# Patient Record
Sex: Female | Born: 1960 | Race: Black or African American | Hispanic: Yes | Marital: Married | Smoking: Current every day smoker
Health system: Southern US, Community
[De-identification: ages and names within clinical notes are randomized; demographics above are authoritative.]

## PROBLEM LIST (undated history)

## (undated) DIAGNOSIS — J40 Bronchitis, not specified as acute or chronic: Secondary | ICD-10-CM

## (undated) DIAGNOSIS — T7840XA Allergy, unspecified, initial encounter: Secondary | ICD-10-CM

## (undated) DIAGNOSIS — M47816 Spondylosis without myelopathy or radiculopathy, lumbar region: Secondary | ICD-10-CM

## (undated) DIAGNOSIS — R7303 Prediabetes: Secondary | ICD-10-CM

## (undated) DIAGNOSIS — Z87442 Personal history of urinary calculi: Secondary | ICD-10-CM

## (undated) DIAGNOSIS — M5030 Other cervical disc degeneration, unspecified cervical region: Secondary | ICD-10-CM

## (undated) DIAGNOSIS — I10 Essential (primary) hypertension: Secondary | ICD-10-CM

## (undated) DIAGNOSIS — J9690 Respiratory failure, unspecified, unspecified whether with hypoxia or hypercapnia: Secondary | ICD-10-CM

## (undated) DIAGNOSIS — I700 Atherosclerosis of aorta: Secondary | ICD-10-CM

## (undated) DIAGNOSIS — G894 Chronic pain syndrome: Secondary | ICD-10-CM

## (undated) DIAGNOSIS — M797 Fibromyalgia: Secondary | ICD-10-CM

## (undated) DIAGNOSIS — J449 Chronic obstructive pulmonary disease, unspecified: Secondary | ICD-10-CM

## (undated) DIAGNOSIS — R0600 Dyspnea, unspecified: Secondary | ICD-10-CM

## (undated) DIAGNOSIS — J45909 Unspecified asthma, uncomplicated: Secondary | ICD-10-CM

## (undated) DIAGNOSIS — E782 Mixed hyperlipidemia: Secondary | ICD-10-CM

## (undated) DIAGNOSIS — J438 Other emphysema: Secondary | ICD-10-CM

## (undated) DIAGNOSIS — R918 Other nonspecific abnormal finding of lung field: Secondary | ICD-10-CM

## (undated) DIAGNOSIS — R0902 Hypoxemia: Secondary | ICD-10-CM

## (undated) DIAGNOSIS — I2510 Atherosclerotic heart disease of native coronary artery without angina pectoris: Secondary | ICD-10-CM

## (undated) DIAGNOSIS — H21531 Iridodialysis, right eye: Secondary | ICD-10-CM

## (undated) HISTORY — PX: KNEE SURGERY: SHX244

## (undated) HISTORY — PX: OTHER SURGICAL HISTORY: SHX169

## (undated) HISTORY — PX: EYE SURGERY: SHX253

## (undated) HISTORY — PX: OVARY SURGERY: SHX727

## (undated) HISTORY — PX: CATARACT EXTRACTION: SUR2

---
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
## 2004-02-07 ENCOUNTER — Other Ambulatory Visit: Payer: Self-pay

## 2005-04-26 ENCOUNTER — Ambulatory Visit: Payer: Self-pay | Admitting: Internal Medicine

## 2005-09-12 ENCOUNTER — Emergency Department: Payer: Self-pay | Admitting: Emergency Medicine

## 2005-11-15 ENCOUNTER — Ambulatory Visit: Payer: Self-pay | Admitting: Obstetrics and Gynecology

## 2006-02-14 ENCOUNTER — Emergency Department: Payer: Self-pay | Admitting: Emergency Medicine

## 2006-05-23 ENCOUNTER — Ambulatory Visit: Payer: Self-pay | Admitting: General Surgery

## 2006-06-16 ENCOUNTER — Ambulatory Visit: Payer: Self-pay

## 2006-08-07 ENCOUNTER — Emergency Department: Payer: Self-pay | Admitting: Emergency Medicine

## 2006-10-26 ENCOUNTER — Emergency Department: Payer: Self-pay | Admitting: Internal Medicine

## 2007-02-13 ENCOUNTER — Ambulatory Visit: Payer: Self-pay

## 2007-04-30 ENCOUNTER — Emergency Department: Payer: Self-pay | Admitting: Unknown Physician Specialty

## 2007-06-19 ENCOUNTER — Emergency Department: Payer: Self-pay | Admitting: Emergency Medicine

## 2007-06-27 ENCOUNTER — Emergency Department: Payer: Self-pay | Admitting: Emergency Medicine

## 2007-08-15 ENCOUNTER — Ambulatory Visit: Payer: Self-pay

## 2007-08-17 ENCOUNTER — Other Ambulatory Visit: Payer: Self-pay

## 2007-08-17 ENCOUNTER — Emergency Department: Payer: Self-pay | Admitting: Emergency Medicine

## 2007-10-04 ENCOUNTER — Ambulatory Visit: Payer: Self-pay | Admitting: Internal Medicine

## 2007-10-17 ENCOUNTER — Emergency Department: Payer: Self-pay | Admitting: Emergency Medicine

## 2008-04-19 ENCOUNTER — Other Ambulatory Visit: Payer: Self-pay

## 2008-04-19 ENCOUNTER — Observation Stay: Payer: Self-pay | Admitting: Internal Medicine

## 2008-04-25 ENCOUNTER — Ambulatory Visit: Payer: Self-pay | Admitting: Internal Medicine

## 2008-05-14 ENCOUNTER — Ambulatory Visit: Payer: Self-pay | Admitting: Family Medicine

## 2008-08-15 ENCOUNTER — Other Ambulatory Visit: Payer: Self-pay

## 2008-08-15 ENCOUNTER — Emergency Department: Payer: Self-pay | Admitting: Emergency Medicine

## 2009-01-28 ENCOUNTER — Emergency Department: Payer: Self-pay | Admitting: Emergency Medicine

## 2009-02-15 ENCOUNTER — Ambulatory Visit: Payer: Self-pay

## 2009-03-21 ENCOUNTER — Emergency Department: Payer: Self-pay | Admitting: Emergency Medicine

## 2009-07-22 ENCOUNTER — Emergency Department: Payer: Self-pay | Admitting: Internal Medicine

## 2009-08-06 ENCOUNTER — Inpatient Hospital Stay: Payer: Self-pay | Admitting: Student

## 2009-08-13 ENCOUNTER — Ambulatory Visit: Payer: Self-pay

## 2010-03-10 ENCOUNTER — Emergency Department: Payer: Self-pay | Admitting: Internal Medicine

## 2010-07-02 ENCOUNTER — Emergency Department: Payer: Self-pay | Admitting: Emergency Medicine

## 2010-09-02 ENCOUNTER — Ambulatory Visit: Payer: Self-pay

## 2010-10-14 ENCOUNTER — Inpatient Hospital Stay: Payer: Self-pay | Admitting: Internal Medicine

## 2011-04-09 ENCOUNTER — Emergency Department: Payer: Self-pay | Admitting: Emergency Medicine

## 2011-09-22 ENCOUNTER — Ambulatory Visit: Payer: Self-pay

## 2011-10-19 ENCOUNTER — Ambulatory Visit: Payer: Self-pay | Admitting: Gynecologic Oncology

## 2011-10-21 ENCOUNTER — Ambulatory Visit: Payer: Self-pay | Admitting: Gynecologic Oncology

## 2011-10-22 ENCOUNTER — Emergency Department: Payer: Self-pay | Admitting: Emergency Medicine

## 2011-10-23 ENCOUNTER — Emergency Department: Payer: Self-pay | Admitting: Emergency Medicine

## 2011-11-27 ENCOUNTER — Emergency Department: Payer: Self-pay | Admitting: Emergency Medicine

## 2011-12-22 ENCOUNTER — Ambulatory Visit: Payer: Self-pay

## 2011-12-28 ENCOUNTER — Ambulatory Visit: Payer: Self-pay

## 2011-12-28 ENCOUNTER — Emergency Department: Payer: Self-pay | Admitting: Emergency Medicine

## 2012-08-05 ENCOUNTER — Emergency Department: Payer: Self-pay | Admitting: Unknown Physician Specialty

## 2012-08-14 ENCOUNTER — Encounter: Payer: Self-pay | Admitting: Family Medicine

## 2012-08-20 ENCOUNTER — Encounter: Payer: Self-pay | Admitting: Family Medicine

## 2012-10-22 ENCOUNTER — Ambulatory Visit: Payer: Self-pay | Admitting: Internal Medicine

## 2012-10-22 LAB — URINALYSIS, COMPLETE
Bilirubin,UR: NEGATIVE
Blood: NEGATIVE
Ketone: NEGATIVE
Nitrite: NEGATIVE
Ph: 5 (ref 4.5–8.0)
Protein: NEGATIVE

## 2012-10-23 LAB — URINE CULTURE

## 2012-11-29 ENCOUNTER — Ambulatory Visit: Payer: Self-pay | Admitting: Family

## 2013-07-05 ENCOUNTER — Ambulatory Visit: Payer: Self-pay

## 2013-07-05 LAB — URINALYSIS, COMPLETE
Bacteria: NEGATIVE
Bilirubin,UR: NEGATIVE
Glucose,UR: NEGATIVE mg/dL (ref 0–75)
Ketone: NEGATIVE
Leukocyte Esterase: NEGATIVE
Nitrite: NEGATIVE
Protein: NEGATIVE
RBC,UR: NONE SEEN /HPF (ref 0–5)

## 2013-07-22 ENCOUNTER — Ambulatory Visit: Payer: Self-pay | Admitting: Family Medicine

## 2013-08-09 ENCOUNTER — Ambulatory Visit: Payer: Self-pay

## 2013-08-10 ENCOUNTER — Ambulatory Visit: Payer: Self-pay | Admitting: Family Medicine

## 2013-08-20 ENCOUNTER — Ambulatory Visit: Payer: Self-pay | Admitting: Physician Assistant

## 2013-12-06 ENCOUNTER — Ambulatory Visit: Payer: Self-pay | Admitting: Family Medicine

## 2013-12-15 ENCOUNTER — Ambulatory Visit: Payer: Self-pay | Admitting: Family Medicine

## 2014-04-16 ENCOUNTER — Emergency Department: Payer: Self-pay | Admitting: Emergency Medicine

## 2014-09-06 DIAGNOSIS — I10 Essential (primary) hypertension: Secondary | ICD-10-CM | POA: Diagnosis present

## 2014-09-29 ENCOUNTER — Ambulatory Visit: Payer: Self-pay | Admitting: Physician Assistant

## 2014-09-29 LAB — CBC WITH DIFFERENTIAL/PLATELET
BASOS ABS: 0.1 10*3/uL (ref 0.0–0.1)
BASOS PCT: 0.6 %
Eosinophil #: 0.1 10*3/uL (ref 0.0–0.7)
Eosinophil %: 0.7 %
HCT: 44.6 % (ref 35.0–47.0)
HGB: 14.8 g/dL (ref 12.0–16.0)
LYMPHS PCT: 23 %
Lymphocyte #: 2.3 10*3/uL (ref 1.0–3.6)
MCH: 31 pg (ref 26.0–34.0)
MCHC: 33.3 g/dL (ref 32.0–36.0)
MCV: 93 fL (ref 80–100)
MONO ABS: 0.6 x10 3/mm (ref 0.2–0.9)
MONOS PCT: 5.7 %
Neutrophil #: 6.9 10*3/uL — ABNORMAL HIGH (ref 1.4–6.5)
Neutrophil %: 70 %
PLATELETS: 216 10*3/uL (ref 150–440)
RBC: 4.78 10*6/uL (ref 3.80–5.20)
RDW: 12.6 % (ref 11.5–14.5)
WBC: 9.9 10*3/uL (ref 3.6–11.0)

## 2014-09-29 LAB — COMPREHENSIVE METABOLIC PANEL
ALK PHOS: 85 U/L
AST: 24 U/L (ref 15–37)
Albumin: 3.9 g/dL (ref 3.4–5.0)
Anion Gap: 8 (ref 7–16)
BUN: 14 mg/dL (ref 7–18)
Bilirubin,Total: 0.4 mg/dL (ref 0.2–1.0)
CREATININE: 0.64 mg/dL (ref 0.60–1.30)
Calcium, Total: 9.2 mg/dL (ref 8.5–10.1)
Chloride: 102 mmol/L (ref 98–107)
Co2: 31 mmol/L (ref 21–32)
EGFR (African American): >60
GLUCOSE: 89 mg/dL (ref 65–99)
Osmolality: 281 (ref 275–301)
POTASSIUM: 3.8 mmol/L (ref 3.5–5.1)
SGPT (ALT): 36 U/L
SODIUM: 141 mmol/L (ref 136–145)
TOTAL PROTEIN: 6.6 g/dL (ref 6.4–8.2)

## 2014-09-29 LAB — URINALYSIS, COMPLETE
BLOOD: NEGATIVE
Bilirubin,UR: NEGATIVE
GLUCOSE, UR: NEGATIVE
KETONE: NEGATIVE
LEUKOCYTE ESTERASE: NEGATIVE
Nitrite: NEGATIVE
Ph: 5.5 (ref 5.0–8.0)
Protein: NEGATIVE
RBC,UR: NONE SEEN /HPF (ref 0–5)
SPECIFIC GRAVITY: 1.02 (ref 1.000–1.030)

## 2014-12-31 ENCOUNTER — Ambulatory Visit: Payer: Self-pay | Admitting: Family Medicine

## 2015-05-06 DIAGNOSIS — R7303 Prediabetes: Secondary | ICD-10-CM | POA: Diagnosis present

## 2015-05-24 ENCOUNTER — Ambulatory Visit
Admission: EM | Admit: 2015-05-24 | Discharge: 2015-05-24 | Disposition: A | Discharge status: Home / Self Care | Payer: Medicaid Other | Attending: Family Medicine | Admitting: Family Medicine

## 2015-05-24 ENCOUNTER — Encounter: Payer: Self-pay | Admitting: *Deleted

## 2015-05-24 DIAGNOSIS — F17210 Nicotine dependence, cigarettes, uncomplicated: Secondary | ICD-10-CM | POA: Insufficient documentation

## 2015-05-24 DIAGNOSIS — J209 Acute bronchitis, unspecified: Secondary | ICD-10-CM | POA: Diagnosis not present

## 2015-05-24 DIAGNOSIS — J0100 Acute maxillary sinusitis, unspecified: Secondary | ICD-10-CM | POA: Diagnosis not present

## 2015-05-24 DIAGNOSIS — H6593 Unspecified nonsuppurative otitis media, bilateral: Secondary | ICD-10-CM

## 2015-05-24 DIAGNOSIS — H1033 Unspecified acute conjunctivitis, bilateral: Secondary | ICD-10-CM | POA: Insufficient documentation

## 2015-05-24 DIAGNOSIS — J40 Bronchitis, not specified as acute or chronic: Secondary | ICD-10-CM | POA: Diagnosis not present

## 2015-05-24 DIAGNOSIS — J302 Other seasonal allergic rhinitis: Secondary | ICD-10-CM

## 2015-05-24 DIAGNOSIS — J029 Acute pharyngitis, unspecified: Secondary | ICD-10-CM | POA: Insufficient documentation

## 2015-05-24 DIAGNOSIS — Z7982 Long term (current) use of aspirin: Secondary | ICD-10-CM | POA: Diagnosis not present

## 2015-05-24 DIAGNOSIS — M549 Dorsalgia, unspecified: Secondary | ICD-10-CM | POA: Diagnosis present

## 2015-05-24 DIAGNOSIS — Z79899 Other long term (current) drug therapy: Secondary | ICD-10-CM | POA: Diagnosis not present

## 2015-05-24 DIAGNOSIS — R300 Dysuria: Secondary | ICD-10-CM

## 2015-05-24 DIAGNOSIS — I10 Essential (primary) hypertension: Secondary | ICD-10-CM | POA: Diagnosis not present

## 2015-05-24 DIAGNOSIS — R05 Cough: Secondary | ICD-10-CM | POA: Insufficient documentation

## 2015-05-24 DIAGNOSIS — H1013 Acute atopic conjunctivitis, bilateral: Secondary | ICD-10-CM

## 2015-05-24 HISTORY — DX: Essential (primary) hypertension: I10

## 2015-05-24 LAB — URINALYSIS COMPLETE WITH MICROSCOPIC (ARMC ONLY)
Glucose, UA: NEGATIVE mg/dL
Hgb urine dipstick: NEGATIVE
Ketones, ur: NEGATIVE mg/dL
Leukocytes, UA: NEGATIVE
Nitrite: NEGATIVE
PH: 5.5 (ref 5.0–8.0)
Protein, ur: 30 mg/dL — AB
Specific Gravity, Urine: 1.025 (ref 1.005–1.030)

## 2015-05-24 MED ORDER — AZITHROMYCIN 250 MG PO TABS: 250.0000 mg | ORAL_TABLET | Freq: Every day | ORAL | Status: DC

## 2015-05-24 MED ORDER — ALBUTEROL SULFATE HFA 108 (90 BASE) MCG/ACT IN AERS: 1.0000 | INHALATION_SPRAY | RESPIRATORY_TRACT | Status: AC | PRN

## 2015-05-24 MED ORDER — IPRATROPIUM-ALBUTEROL 0.5-2.5 (3) MG/3ML IN SOLN
3.0000 mL | Freq: Once | RESPIRATORY_TRACT | Status: AC
  Administered 2015-05-24: 3 mL via RESPIRATORY_TRACT

## 2015-05-24 MED ORDER — BENZONATATE 200 MG PO CAPS: 200.0000 mg | ORAL_CAPSULE | Freq: Three times a day (TID) | ORAL | Status: DC | PRN

## 2015-05-24 MED ORDER — PREDNISONE 20 MG PO TABS: 40.0000 mg | ORAL_TABLET | Freq: Every day | ORAL | Status: DC

## 2015-05-24 MED ORDER — CETIRIZINE HCL 10 MG PO TABS: 10.0000 mg | ORAL_TABLET | Freq: Every day | ORAL | Status: DC

## 2015-05-25 LAB — URINE CULTURE
Culture: 1000
Special Requests: NORMAL

## 2015-05-25 NOTE — Discharge Instructions (Signed)
Acute Bronchitis Bronchitis is inflammation of the airways that extend from the windpipe into the lungs (bronchi). The inflammation often causes mucus to develop. This leads to a cough, which is the most common symptom of bronchitis.  In acute bronchitis, the condition usually develops suddenly and goes away over time, usually in a couple weeks. Smoking, allergies, and asthma can make bronchitis worse. Repeated episodes of bronchitis may cause further lung problems.  CAUSES Acute bronchitis is most often caused by the same virus that causes a cold. The virus can spread from person to person (contagious) through coughing, sneezing, and touching contaminated objects. SIGNS AND SYMPTOMS   Cough.   Fever.   Coughing up mucus.   Body aches.   Chest congestion.   Chills.   Shortness of breath.   Sore throat.  DIAGNOSIS  Acute bronchitis is usually diagnosed through a physical exam. Your health care provider will also ask you questions about your medical history. Tests, such as chest X-rays, are sometimes done to rule out other conditions.  TREATMENT  Acute bronchitis usually goes away in a couple weeks. Oftentimes, no medical treatment is necessary. Medicines are sometimes given for relief of fever or cough. Antibiotic medicines are usually not needed but may be prescribed in certain situations. In some cases, an inhaler may be recommended to help reduce shortness of breath and control the cough. A cool mist vaporizer may also be used to help thin bronchial secretions and make it easier to clear the chest.  HOME CARE INSTRUCTIONS  Get plenty of rest.   Drink enough fluids to keep your urine clear or pale yellow (unless you have a medical condition that requires fluid restriction). Increasing fluids may help thin your respiratory secretions (sputum) and reduce chest congestion, and it will prevent dehydration.   Take medicines only as directed by your health care provider.  If  you were prescribed an antibiotic medicine, finish it all even if you start to feel better.  Avoid smoking and secondhand smoke. Exposure to cigarette smoke or irritating chemicals will make bronchitis worse. If you are a smoker, consider using nicotine gum or skin patches to help control withdrawal symptoms. Quitting smoking will help your lungs heal faster.   Reduce the chances of another bout of acute bronchitis by washing your hands frequently, avoiding people with cold symptoms, and trying not to touch your hands to your mouth, nose, or eyes.   Keep all follow-up visits as directed by your health care provider.  SEEK MEDICAL CARE IF: Your symptoms do not improve after 1 week of treatment.  SEEK IMMEDIATE MEDICAL CARE IF:  You develop an increased fever or chills.   You have chest pain.   You have severe shortness of breath.  You have bloody sputum.   You develop dehydration.  You faint or repeatedly feel like you are going to pass out.  You develop repeated vomiting.  You develop a severe headache. MAKE SURE YOU:   Understand these instructions.  Will watch your condition.  Will get help right away if you are not doing well or get worse. Document Released: 01/13/2005 Document Revised: 04/22/2014 Document Reviewed: 05/29/2013 [facility] Patient Information 2015 [place], [place]. This information is not intended to replace advice given to you by your health care provider. Make sure you discuss any questions you have with your health care provider.  Allergic Rhinitis Allergic rhinitis is when the mucous membranes in the nose respond to allergens. Allergens are particles in the air that cause  your body to have an allergic reaction. This causes you to release allergic antibodies. Through a chain of events, these eventually cause you to release histamine into the blood stream. Although meant to protect the body, it is this release of histamine that causes your discomfort, such  as frequent sneezing, congestion, and an itchy, runny nose.  CAUSES  Seasonal allergic rhinitis (hay fever) is caused by pollen allergens that may come from grasses, trees, and weeds. Year-round allergic rhinitis (perennial allergic rhinitis) is caused by allergens such as house dust mites, pet dander, and mold spores.  SYMPTOMS   Nasal stuffiness (congestion).  Itchy, runny nose with sneezing and tearing of the eyes. DIAGNOSIS  Your health care provider can help you determine the allergen or allergens that trigger your symptoms. If you and your health care provider are unable to determine the allergen, skin or blood testing may be used. TREATMENT  Allergic rhinitis does not have a cure, but it can be controlled by:  Medicines and allergy shots (immunotherapy).  Avoiding the allergen. Hay fever may often be treated with antihistamines in pill or nasal spray forms. Antihistamines block the effects of histamine. There are over-the-counter medicines that may help with nasal congestion and swelling around the eyes. Check with your health care provider before taking or giving this medicine.  If avoiding the allergen or the medicine prescribed do not work, there are many new medicines your health care provider can prescribe. Stronger medicine may be used if initial measures are ineffective. Desensitizing injections can be used if medicine and avoidance does not work. Desensitization is when a patient is given ongoing shots until the body becomes less sensitive to the allergen. Make sure you follow up with your health care provider if problems continue. HOME CARE INSTRUCTIONS It is not possible to completely avoid allergens, but you can reduce your symptoms by taking steps to limit your exposure to them. It helps to know exactly what you are allergic to so that you can avoid your specific triggers. SEEK MEDICAL CARE IF:   You have a fever.  You develop a cough that does not stop easily  (persistent).  You have shortness of breath.  You start wheezing.  Symptoms interfere with normal daily activities. Document Released: 08/31/2001 Document Revised: 12/11/2013 Document Reviewed: 08/13/2013 [facility] Patient Information 2015 [place], [place]. This information is not intended to replace advice given to you by your health care provider. Make sure you discuss any questions you have with your health care provider. Otitis Media With Effusion Otitis media with effusion is the presence of fluid in the middle ear. This is a common problem in children, which often follows ear infections. It may be present for weeks or longer after the infection. Unlike an acute ear infection, otitis media with effusion refers only to fluid behind the ear drum and not infection. Children with repeated ear and sinus infections and allergy problems are the most likely to get otitis media with effusion. CAUSES  The most frequent cause of the fluid buildup is dysfunction of the eustachian tubes. These are the tubes that drain fluid in the ears to the back of the nose (nasopharynx). SYMPTOMS  The main symptom of this condition is hearing loss. As a result, you or your child may: Listen to the TV at a loud volume. Not respond to questions. Ask "what" often when spoken to. Mistake or confuse one sound or word for another. There may be a sensation of fullness or pressure but usually not  pain. DIAGNOSIS  Your health care provider will diagnose this condition by examining you or your child's ears. Your health care provider may test the pressure in you or your child's ear with a tympanometer. A hearing test may be conducted if the problem persists. TREATMENT  Treatment depends on the duration and the effects of the effusion. Antibiotics, decongestants, nose drops, and cortisone-type drugs (tablets or nasal spray) may not be helpful. Children with persistent ear effusions may have delayed language or behavioral  problems. Children at risk for developmental delays in hearing, learning, and speech may require referral to a specialist earlier than children not at risk. You or your child's health care provider may suggest a referral to an ear, nose, and throat surgeon for treatment. The following may help restore normal hearing: Drainage of fluid. Placement of ear tubes (tympanostomy tubes). Removal of adenoids (adenoidectomy). HOME CARE INSTRUCTIONS  Avoid secondhand smoke. Infants who are breastfed are less likely to have this condition. Avoid feeding infants while they are lying flat. Avoid known environmental allergens. Avoid people who are sick. SEEK MEDICAL CARE IF:  Hearing is not better in 3 months. Hearing is worse. Ear pain. Drainage from the ear. Dizziness. MAKE SURE YOU:  Understand these instructions. Will watch your condition. Will get help right away if you are not doing well or get worse. Document Released: 01/13/2005 Document Revised: 04/22/2014 Document Reviewed: 07/03/2013 [facility] Patient Information 2015 [place], [place]. This information is not intended to replace advice given to you by your health care provider. Make sure you discuss any questions you have with your health care provider. Sinusitis Sinusitis is redness, soreness, and inflammation of the paranasal sinuses. Paranasal sinuses are air pockets within the bones of your face (beneath the eyes, the middle of the forehead, or above the eyes). In healthy paranasal sinuses, mucus is able to drain out, and air is able to circulate through them by way of your nose. However, when your paranasal sinuses are inflamed, mucus and air can become trapped. This can allow bacteria and other germs to grow and cause infection. Sinusitis can develop quickly and last only a short time (acute) or continue over a long period (chronic). Sinusitis that lasts for more than 12 weeks is considered chronic.  CAUSES  Causes of sinusitis  include:  Allergies.  Structural abnormalities, such as displacement of the cartilage that separates your nostrils (deviated septum), which can decrease the air flow through your nose and sinuses and affect sinus drainage.  Functional abnormalities, such as when the small hairs (cilia) that line your sinuses and help remove mucus do not work properly or are not present. SIGNS AND SYMPTOMS  Symptoms of acute and chronic sinusitis are the same. The primary symptoms are pain and pressure around the affected sinuses. Other symptoms include:  Upper toothache.  Earache.  Headache.  Bad breath.  Decreased sense of smell and taste.  A cough, which worsens when you are lying flat.  Fatigue.  Fever.  Thick drainage from your nose, which often is green and may contain pus (purulent).  Swelling and warmth over the affected sinuses. DIAGNOSIS  Your health care provider will perform a physical exam. During the exam, your health care provider may:  Look in your nose for signs of abnormal growths in your nostrils (nasal polyps).  Tap over the affected sinus to check for signs of infection.  View the inside of your sinuses (endoscopy) using an imaging device that has a light attached (endoscope). If your health  care provider suspects that you have chronic sinusitis, one or more of the following tests may be recommended:  Allergy tests.  Nasal culture. A sample of mucus is taken from your nose, sent to a lab, and screened for bacteria.  Nasal cytology. A sample of mucus is taken from your nose and examined by your health care provider to determine if your sinusitis is related to an allergy. TREATMENT  Most cases of acute sinusitis are related to a viral infection and will resolve on their own within 10 days. Sometimes medicines are prescribed to help relieve symptoms (pain medicine, decongestants, nasal steroid sprays, or saline sprays).  However, for sinusitis related to a bacterial  infection, your health care provider will prescribe antibiotic medicines. These are medicines that will help kill the bacteria causing the infection.  Rarely, sinusitis is caused by a fungal infection. In theses cases, your health care provider will prescribe antifungal medicine. For some cases of chronic sinusitis, surgery is needed. Generally, these are cases in which sinusitis recurs more than 3 times per year, despite other treatments. HOME CARE INSTRUCTIONS   Drink plenty of water. Water helps thin the mucus so your sinuses can drain more easily.  Use a humidifier.  Inhale steam 3 to 4 times a day (for example, sit in the bathroom with the shower running).  Apply a warm, moist washcloth to your face 3 to 4 times a day, or as directed by your health care provider.  Use saline nasal sprays to help moisten and clean your sinuses.  Take medicines only as directed by your health care provider.  If you were prescribed either an antibiotic or antifungal medicine, finish it all even if you start to feel better. SEEK IMMEDIATE MEDICAL CARE IF:  You have increasing pain or severe headaches.  You have nausea, vomiting, or drowsiness.  You have swelling around your face.  You have vision problems.  You have a stiff neck.  You have difficulty breathing. MAKE SURE YOU:   Understand these instructions.  Will watch your condition.  Will get help right away if you are not doing well or get worse. Document Released: 12/06/2005 Document Revised: 04/22/2014 Document Reviewed: 12/21/2011 [facility] Patient Information 2015 [place], [place]. This information is not intended to replace advice given to you by your health care provider. Make sure you discuss any questions you have with your health care provider. Dysuria Dysuria is the medical term for pain with urination. There are many causes for dysuria, but urinary tract infection is the most common. If a urinalysis was performed it can show  that there is a urinary tract infection. A urine culture confirms that you or your child is sick. You will need to follow up with a healthcare provider because:  If a urine culture was done you will need to know the culture results and treatment recommendations.  If the urine culture was positive, you or your child will need to be put on antibiotics or know if the antibiotics prescribed are the right antibiotics for your urinary tract infection.  If the urine culture is negative (no urinary tract infection), then other causes may need to be explored or antibiotics need to be stopped. Today laboratory work may have been done and there does not seem to be an infection. If cultures were done they will take at least 24 to 48 hours to be completed. Today x-rays may have been taken and they read as normal. No cause can be found for the problems.  The x-rays may be re-read by a radiologist and you will be contacted if additional findings are made. You or your child may have been put on medications to help with this problem until you can see your primary caregiver. If the problems get better, see your primary caregiver if the problems return. If you were given antibiotics (medications which kill germs), take all of the mediations as directed for the full course of treatment.  If laboratory work was done, you need to find the results. Leave a telephone number where you can be reached. If this is not possible, make sure you find out how you are to get test results. HOME CARE INSTRUCTIONS   Drink lots of fluids. For adults, drink eight, 8 ounce glasses of clear juice or water a day. For children, replace fluids as suggested by your caregiver.  Empty the bladder often. Avoid holding urine for long periods of time.  After a bowel movement, women should cleanse front to back, using each tissue only once.  Empty your bladder before and after sexual intercourse.  Take all the medicine given to you until it is  gone. You may feel better in a few days, but TAKE ALL MEDICINE.  Avoid caffeine, tea, alcohol and carbonated beverages, because they tend to irritate the bladder.  In men, alcohol may irritate the prostate.  Only take over-the-counter or prescription medicines for pain, discomfort, or fever as directed by your caregiver.  If your caregiver has given you a follow-up appointment, it is very important to keep that appointment. Not keeping the appointment could result in a chronic or permanent injury, pain, and disability. If there is any problem keeping the appointment, you must call back to this facility for assistance. SEEK IMMEDIATE MEDICAL CARE IF:   Back pain develops.  A fever develops.  There is nausea (feeling sick to your stomach) or vomiting (throwing up).  Problems are no better with medications or are getting worse. MAKE SURE YOU:   Understand these instructions.  Will watch your condition.  Will get help right away if you are not doing well or get worse. Document Released: 09/03/2004 Document Revised: 02/28/2012 Document Reviewed: 07/11/2008 [facility] Patient Information 2015 [place], [place]. This information is not intended to replace advice given to you by your health care provider. Make sure you discuss any questions you have with your health care provider.

## 2015-05-25 NOTE — ED Provider Notes (Signed)
CSN: [id number]     Arrival date & time 05/24/15  1403 History   First MD Initiated Contact with Patient 05/24/15 1518     Chief Complaint  Patient presents with  . Back Pain    x 5 days lower back pain with radiating pain down both legs and groin area  . Cough    x 2 days with sore throat, hoarseness, yellow phlegm when she coughs sometimes, watery eyes, itching in ears and sinus pain and pressure    (Consider location/radiation/quality/duration/timing/severity/associated sxs/prior Treatment) HPI Comments: hispanic female smoker with exposure to sick grandson urinary frequency, back pain, whole body aches, sinus pressure, headache, productive cough x 2 days was nonproductive prior to that, watery eyes, itching in ears, sore throat but doesn't feel like strep, hoarse voice, shortness of breath, wheezing all the time, nausea, postnasal drip, works as Programmer, applications, seasonal allergies had improved so stopped zyrtec but is still using nasal saline  Patient is a 54 y.o. female presenting with back pain and cough. The history is provided by the patient.  Back Pain Location:  Lumbar spine Quality:  Aching Radiates to:  Does not radiate Pain severity:  Mild Pain is:  Same all the time Onset quality:  Gradual Duration:  5 days Timing:  Intermittent Progression:  Waxing and waning Chronicity:  Recurrent Context: lifting heavy objects and recent illness   Context: not emotional stress, not falling, not jumping from heights, not MCA, not MVA, not occupational injury, not pedestrian accident, not physical stress, not recent injury and not twisting   Relieved by:  Nothing Worsened by:  Twisting, bending and coughing Ineffective treatments:  Being still Associated symptoms: dysuria   Associated symptoms: no abdominal pain, no abdominal swelling, no bladder incontinence, no bowel incontinence, no chest pain, no fever, no headaches, no leg pain, no numbness, no paresthesias, no pelvic pain, no  perianal numbness, no tingling, no weakness and no weight loss   Dysuria:    Severity:  Mild   Onset quality:  Gradual   Duration:  5 days   Timing:  Constant   Progression:  Unchanged   Chronicity:  New Risk factors: no hx of cancer, no hx of osteoporosis, no lack of exercise, no menopause, not obese, not pregnant, no recent surgery, no steroid use and no vascular disease   Cough Associated symptoms: myalgias, rhinorrhea, shortness of breath, sore throat and wheezing   Associated symptoms: no chest pain, no chills, no diaphoresis, no ear pain, no eye discharge, no fever, no headaches, no rash and no weight loss     Past Medical History  Diagnosis Date  . Hypertension    Past Surgical History  Procedure Laterality Date  . Knee surgery Left   . Ovary surgery Left     partial ovary and fallopian tube removed   No family history on file. History  Substance Use Topics  . Smoking status: Current Every Day Smoker -- 1.00 packs/day    Types: Cigarettes  . Smokeless tobacco: Not on file  . Alcohol Use: Yes     Comment: occasionally   OB History    No data available     Review of Systems  Constitutional: Positive for fatigue. Negative for fever, chills, weight loss, diaphoresis, activity change and appetite change.  HENT: Positive for congestion, postnasal drip, rhinorrhea, sinus pressure, sneezing, sore throat and voice change. Negative for dental problem, drooling, ear discharge, ear pain, facial swelling, hearing loss, mouth sores, nosebleeds, tinnitus and trouble  swallowing.   Eyes: Positive for redness and itching. Negative for photophobia, pain and discharge.  Respiratory: Positive for cough, chest tightness, shortness of breath and wheezing. Negative for choking and stridor.   Cardiovascular: Negative for chest pain, palpitations and leg swelling.  Gastrointestinal: Positive for nausea. Negative for vomiting, abdominal pain, diarrhea, constipation, blood in stool, abdominal  distention, anal bleeding, rectal pain and bowel incontinence.  Endocrine: Negative for cold intolerance and heat intolerance.  Genitourinary: Positive for dysuria and frequency. Negative for bladder incontinence, urgency, hematuria, flank pain, decreased urine volume, difficulty urinating, menstrual problem, pelvic pain and dyspareunia.  Musculoskeletal: Positive for myalgias and back pain. Negative for joint swelling, arthralgias, gait problem, neck pain and neck stiffness.  Skin: Negative for color change, pallor, rash and wound.  Allergic/Immunologic: Positive for environmental allergies. Negative for food allergies.  Neurological: Negative for dizziness, tingling, tremors, seizures, syncope, facial asymmetry, speech difficulty, weakness, light-headedness, numbness, headaches and paresthesias.  Hematological: Negative for adenopathy. Does not bruise/bleed easily.  Psychiatric/Behavioral: Positive for sleep disturbance. Negative for behavioral problems, confusion, decreased concentration and agitation.    Allergies  Review of patient's allergies indicates no known allergies.  Home Medications   Prior to Admission medications   Medication Sig Start Date End Date Taking? Authorizing Provider  aspirin 81 MG tablet Take 81 mg by mouth daily.   Yes Historical Provider, MD  hydrochlorothiazide (HYDRODIURIL) 25 MG tablet Take 25 mg by mouth daily.   Yes Historical Provider, MD  potassium chloride SA (K-DUR,KLOR-CON) 20 MEQ tablet Take 20 mEq by mouth 2 (two) times daily.   Yes Historical Provider, MD  albuterol (PROVENTIL HFA;VENTOLIN HFA) 108 (90 BASE) MCG/ACT inhaler Inhale 1-2 puffs into the lungs every 4 (four) hours as needed for wheezing or shortness of breath. 05/24/15   Olen Cordial, NP  azithromycin (ZITHROMAX) 250 MG tablet Take 1 tablet (250 mg total) by mouth daily. Take first 2 tablets together, then 1 every day until finished. 05/24/15   Olen Cordial, NP  benzonatate (TESSALON)  200 MG capsule Take 1 capsule (200 mg total) by mouth 3 (three) times daily as needed for cough. 05/24/15   Olen Cordial, NP  cetirizine (ZYRTEC) 10 MG tablet Take 1 tablet (10 mg total) by mouth daily. 05/24/15   Olen Cordial, NP  predniSONE (DELTASONE) 20 MG tablet Take 2 tablets (40 mg total) by mouth daily with breakfast. 05/24/15   Olen Cordial, NP   BP 114/69 mmHg  Pulse 87  Temp(Src) 98.4 F (36.9 C) (Tympanic)  Resp 18  Ht 5' 3.5" (1.613 m)  Wt 207 lb 7 oz (94.093 kg)  BMI 36.17 kg/m2  SpO2 95%  LMP 02/08/2015 Physical Exam  Constitutional: She is oriented to person, place, and time. Vital signs are normal. She appears well-developed and well-nourished. She is active and cooperative.  Non-toxic appearance. She does not have a sickly appearance. She appears ill. No distress. She is not intubated.  Patient smell like smoke  HENT:  Head: Normocephalic and atraumatic.  Right Ear: Hearing, external ear and ear canal normal. A middle ear effusion is present.  Left Ear: Hearing, external ear and ear canal normal. A middle ear effusion is present.  Nose: Mucosal edema and rhinorrhea present. No nose lacerations, sinus tenderness, nasal deformity, septal deviation or nasal septal hematoma. No epistaxis.  No foreign bodies. Right sinus exhibits maxillary sinus tenderness. Right sinus exhibits no frontal sinus tenderness. Left sinus exhibits maxillary sinus tenderness. Left sinus exhibits  no frontal sinus tenderness.  Mouth/Throat: Uvula is midline and mucous membranes are normal. She does not have dentures. No oral lesions. No trismus in the jaw. Normal dentition. No dental abscesses, uvula swelling, lacerations or dental caries. Posterior oropharyngeal edema and posterior oropharyngeal erythema present. No oropharyngeal exudate or tonsillar abscesses.  Cobblestoning posterior pharynx; tonsils 1-2+ erythema/edema; bilateral nasal turbinates with edema/erythema clear to yellow discharge  bilateral TMs with air fluid level slight opacity 6 oclock;   Eyes: EOM and lids are normal. Pupils are equal, round, and reactive to light. Right eye exhibits no chemosis, no discharge, no exudate and no hordeolum. No foreign body present in the right eye. Left eye exhibits no chemosis, no discharge, no exudate and no hordeolum. No foreign body present in the left eye. Right conjunctiva is injected. Right conjunctiva has no hemorrhage. Left conjunctiva is injected. Left conjunctiva has no hemorrhage. No scleral icterus. Right eye exhibits normal extraocular motion and no nystagmus. Left eye exhibits normal extraocular motion and no nystagmus. Right pupil is round and reactive. Left pupil is round and reactive. Pupils are equal.  Bilateral bulbar and lid conjunctiva injected 1-2+/4  Neck: Trachea normal and normal range of motion. Neck supple. No JVD present. No thyromegaly present.  Cardiovascular: Normal rate, regular rhythm, normal heart sounds and intact distal pulses.  Exam reveals no gallop and no friction rub.   No murmur heard. Pulmonary/Chest: Effort normal. No accessory muscle usage. No apnea, no tachypnea and no bradypnea. She is not intubated. No respiratory distress. She has decreased breath sounds in the right lower field and the left lower field. She has wheezes in the right upper field and the left upper field. She has no rhonchi. She has no rales. She exhibits no tenderness.  Repeat evaluation after duoneb wheezing resolved upper lobes bilaterally and increased airflow bilateral lower lobes sp02 room air 92% decreased cough with talking  Abdominal: Soft. Bowel sounds are normal. She exhibits no distension. There is no hepatosplenomegaly. There is no tenderness. There is no rebound, no guarding and no CVA tenderness.  Dull to percussion x 4 quads  Musculoskeletal: Normal range of motion. She exhibits no edema or tenderness.  Lymphadenopathy:    She has no cervical adenopathy.   Neurological: She is alert and oriented to person, place, and time. She displays normal reflexes. Coordination normal.  Skin: Skin is warm, dry and intact. No rash noted. She is not diaphoretic. No erythema. No pallor.  Psychiatric: She has a normal mood and affect. Her speech is normal and behavior is normal. Judgment and thought content normal. Cognition and memory are normal.  Nursing note and vitals reviewed.   ED Course  Procedures (including critical care time) Labs Review Labs Reviewed  URINALYSIS COMPLETEWITH MICROSCOPIC ([facility] ONLY) - Abnormal; Notable for the following:    Bilirubin Urine TRACE (*)    Protein, ur 30 (*)    Bacteria, UA FEW (*)    Squamous Epithelial / LPF 0-5 (*)    All other components within normal limits  URINE CULTURE    Imaging Review No results found.   MDM   1. Bronchitis, acute, with bronchospasm   2. Seasonal allergies   3. Acute maxillary sinusitis, recurrence not specified   4. Otitis media with effusion, bilateral   5. Dysuria   6. Allergic conjunctivitis, bilateral    Work note for 26 May 2015. Albuterol inhaler 1-2 puffs po q4h ATC for the next week.  Prednisone 40mg  po daily x 5  days.  Discussed with patient it may raise blood pressure, increase or decrease appetite, may cause insomnia.   Bronchitis simple, community acquired, may have started as viral (probably respiratory syncytial, parainfluenza, influenza, or adenovirus), but now evidence of acute purulent bronchitis with resultant bronchial edema and mucus formation.  Differential Diagnoses:  Reactive Airway Disease (asthma, allergic aspergillosis eosinophilia), chronic bronchitis, respiratory infection (sinusitis, common cold, pneumonia), congestive heart failure, smoke/irritant exposure, reflux esophagitis, bronchogenic tumor, and/or aspiration syndromes.  I will give Zithromax for five days for possible Mycoplasma.  Without high fever, severe dyspnea and lack of physical findings or  risk factors will hold on chest radiograph and CBC at this time.  I discussed that approximately 50% of patients with acute bronchitis have a cough that lasts up to three weeks, and 25% for over a month. Tylenol, one to two tablets every four hours as needed for fever or myalgias.   No aspirin. Patient instructed to follow up in one week or sooner if symptoms worsen. Patient verbalized agreement and understanding of treatment plan.  P2:  hand washing and cover cough Patient may use normal saline nasal spray as needed. Restart zyrtec 10mg  po daily.  Consider flonase 1 spray each nostril BID and afrin 1 spray each nostril BID for 3 days maximum.   Avoid triggers if possible.  Shower prior to bedtime if exposed to triggers.  If allergic dust/dust mites recommend mattress/pillow covers/encasements; washing linens, vacuuming, sweeping, dusting weekly.  Call or return to clinic as needed if these symptoms worsen or fail to improve as anticipated.   Exitcare handout on allergic rhinitis given to patient.  Patient verbalized understanding of instructions, agreed with plan of care and had no further questions at this time.  P2:  Avoidance and hand washing. Patient has flonase at home restart 1 spray each nostril BID.  Push nasal saline 2 sprays each nostril q2h while awake.   I do not see where any further testing or imaging is necessary at this time.   I will suggest supportive care, rest, good hygiene and encourage the patient to take adequate fluids.  The patient is to return to clinic or EMERGENCY ROOM if symptoms worsen or change significantly.  Exitcare handout on sinusitis given to patient.  Patient verbalized agreement and understanding of treatment plan and had no further questions at this time.   P2:  Hand washing and cover cough Supportive treatment.   No evidence of invasive bacterial infection, non toxic and well hydrated.  Keep allergies under control/decrease throat irritation will help resolution.  This  is most likely self limiting viral infection.  I do not see where any further testing or imaging is necessary at this time.   I will suggest supportive care, rest, good hygiene and encourage the patient to take adequate fluids.  The patient is to return to clinic or EMERGENCY ROOM if symptoms worsen or change significantly e.g. ear pain, fever, purulent discharge from ears or bleeding.  Exitcare handout on otitis media with effusion given to patient.  Patient verbalized agreement and understanding of treatment plan.   LE negative.  Urine culture pending will call patient once available.  Contact me if worsening symptoms e.g. Fever, worsening back pain, urgency, worsening frequency and will start antibiotic.  Hydrate, hydrate, hydrate.  Call or return to clinic as needed if these symptoms worsen or fail to improve as anticipated.  Patient given copy of lab results and discussed results with patient.   Patient verbalized agreement and  understanding of treatment plan and had no further questions at this time. P2:  Hydrate and cranberry juice Cleared for work.  Start antihistamine medication may use zyrtec 10mg  po or ketotifen one gtt each eye BID or OTC eye drops.  Shower after allergen exposure prior to bed.  Hygiene discussed. Patient to apply warm packs prn as directed.  Instructed patient to not rub eyes.  May use over the counter eye drops/tears for pain/symptom relief.  Return to clinic if headache, fever greater than 100.67F, nausea/vomiting, purulent discharge/matting unable to open eye without using fingers, foreign body sensation, ciliary flush, worsening photophobia or vision.  Call or return to clinic as needed if these symptoms worsen or fail to improve as anticipated.  Patient verbalized agreement and understanding of treatment plan.   P2:  Hand washing   Olen Cordial, NP 05/25/15 [place], NP 05/25/15 [phone]

## 2015-06-03 ENCOUNTER — Ambulatory Visit
Admission: EM | Admit: 2015-06-03 | Discharge: 2015-06-03 | Discharge status: Absent without leave | Payer: Medicaid Other

## 2015-07-09 ENCOUNTER — Ambulatory Visit: Payer: Medicaid Other | Admitting: Dietician

## 2015-08-29 ENCOUNTER — Encounter: Payer: Medicaid Other | Attending: Family Medicine | Admitting: Dietician

## 2015-08-29 VITALS — Ht 62.5 in | Wt 212.8 lb

## 2015-08-29 DIAGNOSIS — R7309 Other abnormal glucose: Secondary | ICD-10-CM | POA: Insufficient documentation

## 2015-08-29 DIAGNOSIS — R7303 Prediabetes: Secondary | ICD-10-CM

## 2015-08-29 NOTE — Patient Instructions (Signed)
   Keep working to decrease sugar from drinks. Try some diet sodas (1-2 glasses a day or less), or Healthy Balance juice.  Control portions of starchy foods, keep to 1/2-2/3 cup, along with a lean protein and a vegetable.  If you eat cereal for a meal, include some protein such as 1/4 cup nuts or 1-2 eggs.   Eat generous portions of low-carb veggies with meals or as snacks.  Plan to eat something every 4 hours, even when working 3rd shift.

## 2015-08-29 NOTE — Progress Notes (Signed)
Medical Nutrition Therapy: Visit start time: 1300  end time: 1400  Assessment:  Diagnosis: pre-diabetes Past medical history: HTN, hyperlipidemia, fibromyalgia Psychosocial issues/ stress concerns: sometimes feeling overwhelmed, 24year-old grandson lives with patiente Preferred learning method:  . Auditory . Visual . Hands-on . No preference indicated  Current weight: 212.8lbs with shoes, just after eating Height: 5'2.5" Medications, supplements: reviewed list in chart with patient Progress and evaluation: Patient reports some steroid injections in her back in the past, will be starting on oral steroid meds.           She states that she has begun to make some diet changes to decrease sugar intake (soda).   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 Dining out frequency: occasional meals per week.  Breakfast: 1egg, 2slices bacon when taking meds Snack: none Lunch: sandwich with whole wheat bread, ham or salami, no cheese or PBJ. Or small portion leftovers. Or soup Snack: none, rarely ice cream Supper: fruit or salad, maybe skips if eating a late lunch Snack: usually none Beverages: mostly water, pepsi 20oz  Nutrition Care Education: Topics covered: diabetes prevention Basic nutrition: basic food groups, appropriate nutrient balance, appropriate meal and snack schedule, general nutrition guidelines    Weight control: benefits of weight control, calorie needs for weight loss, provided 1300kcal meal plan and instructed patient on basic meal planning. Diabetes:  goals for BGs, appropriate carb intake and balance, limiting sugar and fat intake, increasing vegetable and fiber intake; examples of balanced meals.  Nutritional Diagnosis:  NI-5.8.2 Excessive carbohydrate intake As related to large portions of starchy foods, sugar-sweetened beverages.  As evidenced by  patient report.  Intervention: Instruction as noted above.   Illustrated food portions using food models.   Discussed options for sugar-free beverages.    Patient declined scheduling follow-up at this time, but will call later if needed.  Education Materials given:  . General diet guidelines for Diabetes . Food lists/ Planning A Balanced Meal . Sample meal pattern/ menus: Quick and Healthy Meal Ideas . Goals/ instructions  Learner/ who was taught:  . Patient   Level of understanding: Marland Kitchen Verbalizes/ demonstrates competency  Demonstrated degree of understanding via:   Teach back Learning barriers: . None  Willingness to learn/ readiness for change: . Acceptance, ready for change  Monitoring and Evaluation:  Dietary intake, exercise, BG cotnrol, and body weight      follow up: prn

## 2016-06-24 ENCOUNTER — Encounter: Payer: Self-pay | Admitting: *Deleted

## 2016-06-24 ENCOUNTER — Ambulatory Visit: Payer: Medicaid Other

## 2016-06-24 ENCOUNTER — Ambulatory Visit
Admission: EM | Admit: 2016-06-24 | Discharge: 2016-06-24 | Disposition: A | Discharge status: Home / Self Care | Payer: Medicaid Other | Attending: Family Medicine | Admitting: Family Medicine

## 2016-06-24 DIAGNOSIS — M542 Cervicalgia: Secondary | ICD-10-CM | POA: Diagnosis present

## 2016-06-24 DIAGNOSIS — M25539 Pain in unspecified wrist: Secondary | ICD-10-CM

## 2016-06-24 DIAGNOSIS — I10 Essential (primary) hypertension: Secondary | ICD-10-CM | POA: Diagnosis not present

## 2016-06-24 DIAGNOSIS — F17210 Nicotine dependence, cigarettes, uncomplicated: Secondary | ICD-10-CM | POA: Diagnosis not present

## 2016-06-24 DIAGNOSIS — Z7982 Long term (current) use of aspirin: Secondary | ICD-10-CM | POA: Insufficient documentation

## 2016-06-24 DIAGNOSIS — Z79899 Other long term (current) drug therapy: Secondary | ICD-10-CM | POA: Insufficient documentation

## 2016-06-24 DIAGNOSIS — S134XXA Sprain of ligaments of cervical spine, initial encounter: Secondary | ICD-10-CM | POA: Insufficient documentation

## 2016-06-24 DIAGNOSIS — M25531 Pain in right wrist: Secondary | ICD-10-CM | POA: Diagnosis not present

## 2016-06-24 DIAGNOSIS — M25532 Pain in left wrist: Secondary | ICD-10-CM | POA: Diagnosis not present

## 2016-06-24 HISTORY — DX: Bronchitis, not specified as acute or chronic: J40

## 2016-06-24 MED ORDER — MELOXICAM 15 MG PO TABS: 15.0000 mg | ORAL_TABLET | Freq: Every day | ORAL | Status: DC

## 2016-06-24 MED ORDER — TRAMADOL HCL 50 MG PO TABS: 50.0000 mg | ORAL_TABLET | Freq: Four times a day (QID) | ORAL | Status: DC | PRN

## 2016-06-24 MED ORDER — KETOROLAC TROMETHAMINE 60 MG/2ML IM SOLN
60.0000 mg | Freq: Once | INTRAMUSCULAR | Status: AC
  Administered 2016-06-24: 60 mg via INTRAMUSCULAR

## 2016-06-24 MED ORDER — ORPHENADRINE CITRATE ER 100 MG PO TB12: 100.0000 mg | ORAL_TABLET | Freq: Two times a day (BID) | ORAL | Status: DC
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
## 2016-08-15 ENCOUNTER — Emergency Department
Admission: EM | Admit: 2016-08-15 | Discharge: 2016-08-16 | Disposition: A | Discharge status: Home / Self Care | Payer: Medicaid Other | Attending: Emergency Medicine | Admitting: Emergency Medicine

## 2016-08-15 ENCOUNTER — Encounter: Payer: Self-pay | Admitting: Emergency Medicine

## 2016-08-15 ENCOUNTER — Emergency Department: Payer: Medicaid Other

## 2016-08-15 DIAGNOSIS — Z791 Long term (current) use of non-steroidal anti-inflammatories (NSAID): Secondary | ICD-10-CM | POA: Diagnosis not present

## 2016-08-15 DIAGNOSIS — F17210 Nicotine dependence, cigarettes, uncomplicated: Secondary | ICD-10-CM | POA: Diagnosis not present

## 2016-08-15 DIAGNOSIS — R42 Dizziness and giddiness: Secondary | ICD-10-CM

## 2016-08-15 DIAGNOSIS — K029 Dental caries, unspecified: Secondary | ICD-10-CM | POA: Insufficient documentation

## 2016-08-15 DIAGNOSIS — I10 Essential (primary) hypertension: Secondary | ICD-10-CM | POA: Insufficient documentation

## 2016-08-15 DIAGNOSIS — R6884 Jaw pain: Secondary | ICD-10-CM

## 2016-08-15 DIAGNOSIS — Z7982 Long term (current) use of aspirin: Secondary | ICD-10-CM | POA: Insufficient documentation

## 2016-08-15 DIAGNOSIS — Z79899 Other long term (current) drug therapy: Secondary | ICD-10-CM | POA: Insufficient documentation

## 2016-08-15 LAB — BASIC METABOLIC PANEL
ANION GAP: 8 (ref 5–15)
BUN: 21 mg/dL — ABNORMAL HIGH (ref 6–20)
CALCIUM: 9.8 mg/dL (ref 8.9–10.3)
CO2: 31 mmol/L (ref 22–32)
Chloride: 102 mmol/L (ref 101–111)
Creatinine, Ser: 0.55 mg/dL (ref 0.44–1.00)
Glucose, Bld: 117 mg/dL — ABNORMAL HIGH (ref 65–99)
POTASSIUM: 3.4 mmol/L — AB (ref 3.5–5.1)
Sodium: 141 mmol/L (ref 135–145)

## 2016-08-15 LAB — CBC
HEMATOCRIT: 45.8 % (ref 35.0–47.0)
HEMOGLOBIN: 15.5 g/dL (ref 12.0–16.0)
MCH: 31.5 pg (ref 26.0–34.0)
MCHC: 33.9 g/dL (ref 32.0–36.0)
MCV: 92.8 fL (ref 80.0–100.0)
Platelets: 194 10*3/uL (ref 150–440)
RBC: 4.94 MIL/uL (ref 3.80–5.20)
RDW: 12.6 % (ref 11.5–14.5)
WBC: 11.5 10*3/uL — ABNORMAL HIGH (ref 3.6–11.0)

## 2016-08-15 LAB — TROPONIN I

## 2016-08-15 MED ORDER — ONDANSETRON 4 MG PO TBDP
4.0000 mg | ORAL_TABLET | Freq: Once | ORAL | Status: AC
  Administered 2016-08-15: 4 mg via ORAL

## 2016-08-15 MED ORDER — ONDANSETRON 4 MG PO TBDP
ORAL_TABLET | ORAL | Status: AC
  Filled 2016-08-15: qty 1

## 2016-08-15 NOTE — ED Triage Notes (Signed)
Pt states two days ago she began to have right sided jaw pain that progressed to right sided headache. Pt states she then began to feel dizzy, began to have sweats and has felt like she "will pass out" intermittently. Pt denies pain currently, but continues to complain fo dizziness, and sensation of intermittent near syncope. Skin normal color warm and dry, perrl 72mm and brisk. Cms intact to extremities.

## 2016-08-16 MED ORDER — ONDANSETRON 4 MG PO TBDP: ORAL_TABLET | ORAL | 0 refills | Status: DC

## 2016-08-16 MED ORDER — PENICILLIN V POTASSIUM 500 MG PO TABS: 500.0000 mg | ORAL_TABLET | Freq: Four times a day (QID) | ORAL | 0 refills | Status: DC

## 2016-08-16 MED ORDER — OXYCODONE-ACETAMINOPHEN 5-325 MG PO TABS: 1.0000 | ORAL_TABLET | ORAL | 0 refills | Status: DC | PRN

## 2016-08-16 MED ORDER — ONDANSETRON 4 MG PO TBDP
4.0000 mg | ORAL_TABLET | Freq: Once | ORAL | Status: AC
  Administered 2016-08-16: 4 mg via ORAL
  Filled 2016-08-16: qty 1

## 2016-08-16 MED ORDER — OXYCODONE-ACETAMINOPHEN 5-325 MG PO TABS
2.0000 | ORAL_TABLET | Freq: Once | ORAL | Status: AC
  Administered 2016-08-16: 2 via ORAL
  Filled 2016-08-16: qty 2

## 2016-08-16 NOTE — ED Notes (Signed)
Patient angry because she has been "here too damn long and this is ridiculous." States "I've been here in the lobby sitting and then they bring you back in this room and you sit and you wait and I'm gonna be seen right now or I'm leaving." MD and primary RN notified.

## 2016-08-16 NOTE — Discharge Instructions (Signed)
As we discussed, your lab work, CT scan, and EKG were all reassuring.  Given the tenderness had a specific point on your lower jaw right near where you have some dental problems, we suspect that the pain your experiencing in her face is from a dental source.  Please take the full course of antibiotics prescribed and follow up with your dentist at the next available opportunity.  Return to the emergency department with new or worsening symptoms that concern you.
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
## 2016-08-16 NOTE — ED Provider Notes (Signed)
[facility] Emergency Department Provider Note  ____________________________________________   First MD Initiated Contact with Patient 08/16/16 0022     (approximate)  I have reviewed the triage vital signs and the nursing notes.   HISTORY  Chief Complaint Dizziness    HPI Gloria Jones is a 55 y.o. female reports history of hypertension who presents for evaluation of 3 days of gradual onset pain in her right lower jaw that is radiating up into her face.  It is worse when she chews and swallows, better with rest.  It is limited her ability to take by mouth intake.  As a result she felt somewhat lightheaded and dizzy today and a little bit nauseated.  She denies fever/chills, chest pain, shortness of breath, vomiting, abdominal pain, dysuria.  She reports the pain is severe.  She saw her primary care doctor about 4 days ago but the pain only started 3 days ago.  She has not seen her dentist recently and does state that the pain is located in her drawer) area where she has had chronic problems with her teeth.She denies any history of cardiac issues or lung issues.   Past Medical History:  Diagnosis Date  . Bronchitis   . Hypertension     There are no active problems to display for this patient.   Past Surgical History:  Procedure Laterality Date  . KNEE SURGERY Left   . OVARY SURGERY Left    partial ovary and fallopian tube removed    Prior to Admission medications   Medication Sig Start Date End Date Taking? Authorizing Provider  albuterol (PROVENTIL HFA;VENTOLIN HFA) 108 (90 BASE) MCG/ACT inhaler Inhale 1-2 puffs into the lungs every 4 (four) hours as needed for wheezing or shortness of breath. 05/24/15   Olen Cordial, NP  aspirin 81 MG tablet Take 81 mg by mouth daily.    Historical Provider, MD  azithromycin (ZITHROMAX) 250 MG tablet Take 1 tablet (250 mg total) by mouth daily. Take first 2 tablets together, then 1 every day until finished.  05/24/15   Olen Cordial, NP  beclomethasone (QVAR) 80 MCG/ACT inhaler Inhale into the lungs. 08/28/15 08/27/16  Historical Provider, MD  benzonatate (TESSALON) 200 MG capsule Take 1 capsule (200 mg total) by mouth 3 (three) times daily as needed for cough. 05/24/15   Olen Cordial, NP  cetirizine (ZYRTEC) 10 MG tablet Take 1 tablet (10 mg total) by mouth daily. 05/24/15   Olen Cordial, NP  fluticasone (FLONASE) 50 MCG/ACT nasal spray Place into the nose. 11/06/14 06/24/16  Historical Provider, MD  hydrochlorothiazide (HYDRODIURIL) 25 MG tablet Take 25 mg by mouth daily.    Historical Provider, MD  meloxicam (MOBIC) 15 MG tablet Take 1 tablet (15 mg total) by mouth daily. 06/24/16   Frederich Cha, MD  montelukast (SINGULAIR) 10 MG tablet Take by mouth. 08/14/15 08/13/16  Historical Provider, MD  nicotine (RA NICOTINE) 21 mg/24hr patch Place onto the skin. 05/22/15   Historical Provider, MD  ondansetron (ZOFRAN ODT) 4 MG disintegrating tablet Allow 1-2 tablets to dissolve in your mouth every 8 hours as needed for nausea/vomiting 08/16/16   Hinda Kehr, MD  orphenadrine (NORFLEX) 100 MG tablet Take 1 tablet (100 mg total) by mouth 2 (two) times daily. 06/24/16   Frederich Cha, MD  oxyCODONE-acetaminophen (ROXICET) 5-325 MG tablet Take 1-2 tablets by mouth every 4 (four) hours as needed for severe pain. 08/16/16   Hinda Kehr, MD  penicillin v potassium (VEETID)  500 MG tablet Take 1 tablet (500 mg total) by mouth 4 (four) times daily. 08/16/16   Hinda Kehr, MD  potassium chloride SA (K-DUR,KLOR-CON) 20 MEQ tablet Take 20 mEq by mouth 2 (two) times daily.    Historical Provider, MD  predniSONE (DELTASONE) 20 MG tablet Take 2 tablets (40 mg total) by mouth daily with breakfast. 05/24/15   Olen Cordial, NP  pregabalin (LYRICA) 50 MG capsule Take by mouth. 05/22/15 05/21/16  Historical Provider, MD  traMADol (ULTRAM) 50 MG tablet Take 1 tablet (50 mg total) by mouth every 6 (six) hours as needed. 06/24/16   Frederich Cha,  MD    Allergies Lisinopril and Losartan  Family History  Problem Relation Age of Onset  . Hypertension Mother     Social History Social History  Substance Use Topics  . Smoking status: Current Every Day Smoker    Packs/day: 1.00    Types: Cigarettes  . Smokeless tobacco: Never Used  . Alcohol use Yes     Comment: occasionally    Review of Systems Constitutional: No fever/chills Eyes: No visual changes. ENT: No sore throat. Pain in right lower jaw radiating into face.  Chronic caries.  Cardiovascular: Denies chest pain. Respiratory: Denies shortness of breath. Gastrointestinal: No abdominal pain.  No nausea, no vomiting.  No diarrhea.  No constipation. Genitourinary: Negative for dysuria. Musculoskeletal: Negative for back pain. Skin: Negative for rash. Neurological: Negative for headaches, focal weakness or numbness.  10-point ROS otherwise negative.  ____________________________________________   PHYSICAL EXAM:  VITAL SIGNS: ED Triage Vitals [08/15/16 2013]  Enc Vitals Group     BP (!) 147/79     Pulse Rate 72     Resp 20     Temp 98.2 F (36.8 C)     Temp Source Oral     SpO2 100 %     Weight 206 lb (93.4 kg)     Height 5\' 3"  (1.6 m)     Head Circumference      Peak Flow      Pain Score      Pain Loc      Pain Edu?      Excl. in [place]?     Constitutional: Alert and oriented. Well appearing and in no acute distress. Eyes: Conjunctivae are normal. PERRL. EOMI. Head: Atraumatic. Nose: No congestion/rhinnorhea. Mouth/Throat: Mucous membranes are moist.  Oropharynx non-erythematous. Chronic dental caries.  More extensive dental issues around tooth #28 on the patient's bottom right.  She has point tenderness to the gums around that area although there is no sign of abscess or inflammation at this time.  She has some tenderness of the jaw on that area as well but no fluctuance, induration, or obvious external swelling.  There is no evidence of Ludwig angina or  deep tissue infection or abscess.  She has no tenderness to palpation of the TMJ on the left or the right. Neck: No stridor.  No meningeal signs.   Cardiovascular: Normal rate, regular rhythm. Good peripheral circulation. Grossly normal heart sounds. Respiratory: Normal respiratory effort.  No retractions. Lungs CTAB. Gastrointestinal: Soft and nontender. No distention.  Musculoskeletal: No lower extremity tenderness nor edema. No gross deformities of extremities. Neurologic:  Normal speech and language. No gross focal neurologic deficits are appreciated.  Skin:  Skin is warm, dry and intact. No rash noted. Psychiatric: Mood and affect are normal. Speech and behavior are normal.  ____________________________________________   LABS (all labs ordered are listed, but only abnormal  results are displayed)  Labs Reviewed  BASIC METABOLIC PANEL - Abnormal; Notable for the following:       Result Value   Potassium 3.4 (*)    Glucose, Bld 117 (*)    BUN 21 (*)    All other components within normal limits  CBC - Abnormal; Notable for the following:    WBC 11.5 (*)    All other components within normal limits  TROPONIN I  CBG MONITORING, ED   ____________________________________________  EKG  ED ECG REPORT I, Aithan Farrelly, the attending physician, personally viewed and interpreted this ECG.  Date: 08/16/2016 EKG Time: 20:18 Rate: 70 Rhythm: normal sinus rhythm QRS Axis: normal Intervals: normal ST/T Wave abnormalities: normal Conduction Disturbances: none Narrative Interpretation: unremarkable  ____________________________________________  RADIOLOGY   Ct Head Wo Contrast  Result Date: 08/15/2016 CLINICAL DATA:  Dizziness and nausea today. EXAM: CT HEAD WITHOUT CONTRAST TECHNIQUE: Contiguous axial images were obtained from the base of the skull through the vertex without intravenous contrast. COMPARISON:  07/22/2009 FINDINGS: Brain: There is no evidence for acute hemorrhage,  hydrocephalus, mass lesion, or abnormal extra-axial fluid collection. No definite CT evidence for acute infarction. 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 Vascular: Atherosclerotic calcification is visualized in the carotid arteries. No dense MCA sign. Major dural sinuses are unremarkable. Skull: No evidence for skull fracture Sinuses/Orbits: The visualized paranasal sinuses and mastoid air cells are clear. Visualized portions of the globes and intraorbital fat are unremarkable. Other: N/A IMPRESSION: Stable.  No acute intracranial abnormality. Electronically Signed   By: Misty Stanley M.D.   On: 08/15/2016 21:26    ____________________________________________   PROCEDURES  Procedure(s) performed:   Procedures   Critical Care performed: No ____________________________________________   INITIAL IMPRESSION / ASSESSMENT AND PLAN / ED COURSE  Pertinent labs & imaging results that were available during my care of the patient were reviewed by me and considered in my medical decision making (see chart for details).  The patient is well-appearing and in no acute distress and very irritated about waiting for over 4 hours for room.  She is stating that she wants to go home.  I explained that her lab work, head CT, and EKG are all within normal limits.  Given the chronic dental problems, the point tenderness in her jaw at the site of the worst of the dental issues, and the weight it radiates in her job without any CT scan findings that are concerning, I believe she most likely needs to see a dentist to follow up on these issues.  I am giving her a course of penicillin for probable dental infection as well as some Norco and her friend.  I encouraged her follow up with her regular doctor as well as with her dentist, giving them both a call tomorrow.  I gave my usual and customary return precautions.   She understands and agrees with the  plan.   ____________________________________________  FINAL CLINICAL IMPRESSION(S) / ED DIAGNOSES  Final diagnoses:  Pain in lower jaw  Dental caries  Lightheadedness     MEDICATIONS GIVEN DURING THIS VISIT:  Medications  ondansetron (ZOFRAN-ODT) disintegrating tablet 4 mg (4 mg Oral Given 08/15/16 2020)  oxyCODONE-acetaminophen (PERCOCET/ROXICET) 5-325 MG per tablet 2 tablet (2 tablets Oral Given 08/16/16 0045)  ondansetron (ZOFRAN-ODT) disintegrating tablet 4 mg (4 mg Oral Given 08/16/16 0046)     NEW OUTPATIENT MEDICATIONS STARTED DURING THIS VISIT:  Discharge Medication List as of 08/16/2016 12:39 AM  START taking these medications   Details  ondansetron (ZOFRAN ODT) 4 MG disintegrating tablet Allow 1-2 tablets to dissolve in your mouth every 8 hours as needed for nausea/vomiting, Print    oxyCODONE-acetaminophen (ROXICET) 5-325 MG tablet Take 1-2 tablets by mouth every 4 (four) hours as needed for severe pain., Starting Mon 08/16/2016, Print    penicillin v potassium (VEETID) 500 MG tablet Take 1 tablet (500 mg total) by mouth 4 (four) times daily., Starting Mon 08/16/2016, Print          Note:  This document was prepared using Dragon voice recognition software and may include unintentional dictation errors.    Hinda Kehr, MD 08/16/16 [phone]

## 2016-12-23 ENCOUNTER — Other Ambulatory Visit: Payer: Self-pay | Admitting: Family Medicine

## 2017-01-14 ENCOUNTER — Other Ambulatory Visit: Payer: Self-pay | Admitting: Family Medicine

## 2017-01-14 DIAGNOSIS — N644 Mastodynia: Secondary | ICD-10-CM

## 2017-02-08 ENCOUNTER — Ambulatory Visit
Admission: RE | Admit: 2017-02-08 | Discharge: 2017-02-08 | Disposition: A | Discharge status: Home / Self Care | Payer: Medicaid Other | Source: Ambulatory Visit | Attending: Family Medicine | Admitting: Family Medicine

## 2017-02-08 DIAGNOSIS — N644 Mastodynia: Secondary | ICD-10-CM | POA: Insufficient documentation

## 2017-06-05 ENCOUNTER — Ambulatory Visit
Admission: EM | Admit: 2017-06-05 | Discharge: 2017-06-05 | Disposition: A | Discharge status: Home / Self Care | Payer: Medicaid Other | Attending: Internal Medicine | Admitting: Internal Medicine

## 2017-06-05 DIAGNOSIS — J40 Bronchitis, not specified as acute or chronic: Secondary | ICD-10-CM | POA: Diagnosis not present

## 2017-06-05 DIAGNOSIS — J320 Chronic maxillary sinusitis: Secondary | ICD-10-CM | POA: Diagnosis not present

## 2017-06-05 MED ORDER — HYDROCOD POLST-CPM POLST ER 10-8 MG/5ML PO SUER: 5.0000 mL | Freq: Two times a day (BID) | ORAL | 0 refills | Status: AC | PRN

## 2017-06-05 MED ORDER — IPRATROPIUM-ALBUTEROL 0.5-2.5 (3) MG/3ML IN SOLN
3.0000 mL | Freq: Four times a day (QID) | RESPIRATORY_TRACT | Status: DC
  Administered 2017-06-05: 3 mL via RESPIRATORY_TRACT

## 2017-06-05 MED ORDER — PREDNISONE 20 MG PO TABS: 20.0000 mg | ORAL_TABLET | ORAL | 0 refills | Status: AC

## 2017-06-05 MED ORDER — AZITHROMYCIN 250 MG PO TABS: 250.0000 mg | ORAL_TABLET | Freq: Every day | ORAL | 0 refills | Status: DC

## 2017-06-05 NOTE — ED Triage Notes (Addendum)
Pt reports doing spring cleaning and was around a lot of dust. Ever since then, reports cough, nasal drainage, and some headache. Pain 8/10 to nasal area/face/head.Pt with expiratory wheezes in triage and low oxygen saturation. NAD

## 2017-06-05 NOTE — Discharge Instructions (Addendum)
1) Continue your albuterol inhaler at home as needed for wheezing or shortness of breath 2) Start prednisone 6-day taper; take as directed on the package 3) Start z-pak antibiotic 4) Use Tussionex cough syrup as needed for coughing  I do not feel the need for chest xray today. I believe you will get better with the above medication. Please follow up with your primary care doctor if you do not improve.

## 2017-06-05 NOTE — ED Provider Notes (Signed)
CSN: [id number]     Arrival date & time 06/05/17  1509 History   First MD Initiated Contact with Patient 06/05/17 1541     Chief Complaint  Patient presents with  . Cough  . Nasal Congestion   (Consider location/radiation/quality/duration/timing/severity/associated sxs/prior Treatment) Patient was dusting around the house 2 days ago and believed all the dust got down in her throat. Since then, she noticed shortness of breath, wheezing, coughing, stuffy nose, right otalgia, scratchy throat, running nose, headache and maxillary sinus pain. Her symptoms are getting worse. She have not tried anything OTC to help. She is on zyrtec daily for her allergic rhinitis. She denies CP. She is not currently out of breath.         Past Medical History:  Diagnosis Date  . Bronchitis   . Hypertension    Past Surgical History:  Procedure Laterality Date  . KNEE SURGERY Left   . OVARY SURGERY Left    partial ovary and fallopian tube removed   Family History  Problem Relation Age of Onset  . Hypertension Mother    Social History  Substance Use Topics  . Smoking status: Current Every Day Smoker    Packs/day: 1.00    Types: Cigarettes  . Smokeless tobacco: Never Used  . Alcohol use Yes     Comment: occasionally   OB History    No data available     Review of Systems  Constitutional: Positive for fatigue. Negative for chills and fever.  HENT: Positive for congestion, ear pain, rhinorrhea, sinus pain, sinus pressure, sneezing and sore throat.   Respiratory: Positive for cough, shortness of breath and wheezing.   Cardiovascular: Negative for chest pain and palpitations.  Gastrointestinal: Negative for abdominal pain, diarrhea, nausea and vomiting.  Skin: Negative for rash.  Neurological: Positive for headaches. Negative for dizziness.    Allergies  Lisinopril and Losartan  Home Medications   Prior to Admission medications   Medication Sig Start Date End Date Taking? Authorizing  Provider  albuterol (PROVENTIL HFA;VENTOLIN HFA) 108 (90 BASE) MCG/ACT inhaler Inhale 1-2 puffs into the lungs every 4 (four) hours as needed for wheezing or shortness of breath. 05/24/15   Betancourt, Aura Fey, NP  aspirin 81 MG tablet Take 81 mg by mouth daily.    [provider]  azithromycin (ZITHROMAX) 250 MG tablet Take 1 tablet (250 mg total) by mouth daily. Take first 2 tablets together, then 1 every day until finished. 06/05/17   Barry Dienes, NP  beclomethasone (QVAR) 80 MCG/ACT inhaler Inhale into the lungs. 08/28/15 08/27/16  [provider]  cetirizine (ZYRTEC) 10 MG tablet Take 1 tablet (10 mg total) by mouth daily. 05/24/15   Betancourt, Aura Fey, NP  chlorpheniramine-HYDROcodone (TUSSIONEX PENNKINETIC ER) 10-8 MG/5ML SUER Take 5 mLs by mouth every 12 (twelve) hours as needed for cough. 06/05/17 06/12/17  Barry Dienes, NP  fluticasone (FLONASE) 50 MCG/ACT nasal spray Place into the nose. 11/06/14 06/24/16  [provider]  hydrochlorothiazide (HYDRODIURIL) 25 MG tablet Take 25 mg by mouth daily.    [provider]  meloxicam (MOBIC) 15 MG tablet Take 1 tablet (15 mg total) by mouth daily. 06/24/16   Frederich Cha, MD  montelukast (SINGULAIR) 10 MG tablet Take by mouth. 08/14/15 08/13/16  [provider]  nicotine (RA NICOTINE) 21 mg/24hr patch Place onto the skin. 05/22/15   [provider]  ondansetron (ZOFRAN ODT) 4 MG disintegrating tablet Allow 1-2 tablets to dissolve in your mouth every 8 hours  as needed for nausea/vomiting 08/16/16   Hinda Kehr, MD  orphenadrine (NORFLEX) 100 MG tablet Take 1 tablet (100 mg total) by mouth 2 (two) times daily. 06/24/16   Frederich Cha, MD  oxyCODONE-acetaminophen (ROXICET) 5-325 MG tablet Take 1-2 tablets by mouth every 4 (four) hours as needed for severe pain. 08/16/16   Hinda Kehr, MD  potassium chloride SA (K-DUR,KLOR-CON) 20 MEQ tablet Take 20 mEq by mouth 2 (two) times daily.    [provider]   predniSONE (DELTASONE) 20 MG tablet Take 1 tablet (20 mg total) by mouth as directed. 60 mg on day 1-2, 40 mg on 3-4, 20 mg on day 5-6 06/05/17 06/11/17  Barry Dienes, NP  pregabalin (LYRICA) 50 MG capsule Take by mouth. 05/22/15 05/21/16  [provider]  traMADol (ULTRAM) 50 MG tablet Take 1 tablet (50 mg total) by mouth every 6 (six) hours as needed. 06/24/16   Frederich Cha, MD   Meds Ordered and Administered this Visit   Medications  ipratropium-albuterol (DUONEB) 0.5-2.5 (3) MG/3ML nebulizer solution 3 mL (3 mLs Nebulization Given 06/05/17 1553)    BP 126/75 (BP Location: Left Arm)   Pulse 87   Temp 98.9 F (37.2 C)   Resp 20   Ht 5\' 3"  (1.6 m)   Wt 207 lb (93.9 kg)   LMP 02/08/2015   SpO2 92%   BMI 36.67 kg/m  No data found.   Physical Exam  Constitutional: She is oriented to person, place, and time. She appears well-developed and well-nourished.  HENT:  Head: Normocephalic and atraumatic.  Right Ear: External ear normal.  Left Ear: External ear normal.  Nose: Nose normal.  Mouth/Throat: Oropharynx is clear and moist. No oropharyngeal exudate.  TM pearly gray bilaterally.  Maxillary sinus very sore and tender to percuss  Eyes: Conjunctivae are normal. Pupils are equal, round, and reactive to light. Right eye exhibits no discharge. Left eye exhibits no discharge.  Neck: Normal range of motion.  Cardiovascular: Normal rate, regular rhythm and normal heart sounds.   No murmur heard. Pulmonary/Chest: Effort normal. She has wheezes.  Abdominal: Soft. Bowel sounds are normal. There is no tenderness.  Musculoskeletal: Normal range of motion.  Lymphadenopathy:    She has no cervical adenopathy.  Neurological: She is alert and oriented to person, place, and time.  Skin: Skin is warm and dry.  Psychiatric: She has a normal mood and affect.  Nursing note and vitals reviewed.   Urgent Care Course     Procedures (including critical care time)  Labs Review Labs  Reviewed - No data to display  Imaging Review No results found.  15:50: DuoNeb administered  MDM   1. Chronic maxillary sinusitis   2. Bronchitis    Believes patient to have sinusitis and bronchitis. Will treat empirically with prednisone 6-day taper, z-pak and Tussionex PRN for cough. Continue with albuterol at home PRN for wheezing or SOB. Return precaution discussed. F/u with PCP for no improvement.    Barry Dienes, NP 06/05/17 [phone]

## 2017-06-28 ENCOUNTER — Ambulatory Visit
Admission: RE | Admit: 2017-06-28 | Discharge: 2017-06-28 | Disposition: A | Discharge status: Home / Self Care | Payer: Medicaid Other | Source: Ambulatory Visit | Attending: Gastroenterology | Admitting: Gastroenterology

## 2017-06-28 ENCOUNTER — Encounter: Payer: Self-pay | Admitting: *Deleted

## 2017-06-28 ENCOUNTER — Encounter
Admission: RE | Disposition: A | Discharge status: Home / Self Care | Payer: Self-pay | Source: Ambulatory Visit | Attending: Gastroenterology

## 2017-06-28 ENCOUNTER — Ambulatory Visit: Payer: Medicaid Other | Admitting: Anesthesiology

## 2017-06-28 DIAGNOSIS — K625 Hemorrhage of anus and rectum: Secondary | ICD-10-CM | POA: Diagnosis not present

## 2017-06-28 DIAGNOSIS — F17200 Nicotine dependence, unspecified, uncomplicated: Secondary | ICD-10-CM | POA: Insufficient documentation

## 2017-06-28 DIAGNOSIS — J449 Chronic obstructive pulmonary disease, unspecified: Secondary | ICD-10-CM | POA: Diagnosis not present

## 2017-06-28 DIAGNOSIS — D128 Benign neoplasm of rectum: Secondary | ICD-10-CM | POA: Diagnosis not present

## 2017-06-28 DIAGNOSIS — Z791 Long term (current) use of non-steroidal anti-inflammatories (NSAID): Secondary | ICD-10-CM | POA: Insufficient documentation

## 2017-06-28 DIAGNOSIS — I10 Essential (primary) hypertension: Secondary | ICD-10-CM | POA: Diagnosis not present

## 2017-06-28 DIAGNOSIS — D124 Benign neoplasm of descending colon: Secondary | ICD-10-CM | POA: Diagnosis not present

## 2017-06-28 DIAGNOSIS — Z86010 Personal history of colonic polyps: Secondary | ICD-10-CM | POA: Diagnosis not present

## 2017-06-28 DIAGNOSIS — Z7951 Long term (current) use of inhaled steroids: Secondary | ICD-10-CM | POA: Insufficient documentation

## 2017-06-28 DIAGNOSIS — K2210 Ulcer of esophagus without bleeding: Secondary | ICD-10-CM | POA: Diagnosis not present

## 2017-06-28 DIAGNOSIS — Z7982 Long term (current) use of aspirin: Secondary | ICD-10-CM | POA: Insufficient documentation

## 2017-06-28 DIAGNOSIS — K2950 Unspecified chronic gastritis without bleeding: Secondary | ICD-10-CM | POA: Diagnosis not present

## 2017-06-28 DIAGNOSIS — D120 Benign neoplasm of cecum: Secondary | ICD-10-CM | POA: Diagnosis not present

## 2017-06-28 DIAGNOSIS — K640 First degree hemorrhoids: Secondary | ICD-10-CM | POA: Insufficient documentation

## 2017-06-28 DIAGNOSIS — R1084 Generalized abdominal pain: Secondary | ICD-10-CM | POA: Insufficient documentation

## 2017-06-28 DIAGNOSIS — K635 Polyp of colon: Secondary | ICD-10-CM | POA: Insufficient documentation

## 2017-06-28 DIAGNOSIS — K319 Disease of stomach and duodenum, unspecified: Secondary | ICD-10-CM | POA: Diagnosis not present

## 2017-06-28 DIAGNOSIS — Z79899 Other long term (current) drug therapy: Secondary | ICD-10-CM | POA: Diagnosis not present

## 2017-06-28 DIAGNOSIS — Z7984 Long term (current) use of oral hypoglycemic drugs: Secondary | ICD-10-CM | POA: Diagnosis not present

## 2017-06-28 HISTORY — PX: ESOPHAGOGASTRODUODENOSCOPY (EGD) WITH PROPOFOL: SHX5813

## 2017-06-28 HISTORY — DX: Chronic obstructive pulmonary disease, unspecified: J44.9

## 2017-06-28 HISTORY — PX: COLONOSCOPY WITH PROPOFOL: SHX5780

## 2017-06-28 HISTORY — DX: Allergy, unspecified, initial encounter: T78.40XA

## 2017-06-28 LAB — GLUCOSE, CAPILLARY: GLUCOSE-CAPILLARY: 94 mg/dL (ref 65–99)

## 2017-06-28 SURGERY — ESOPHAGOGASTRODUODENOSCOPY (EGD) WITH PROPOFOL
Anesthesia: General

## 2017-06-28 MED ORDER — GLYCOPYRROLATE 0.2 MG/ML IJ SOLN
INTRAMUSCULAR | Status: DC | PRN
  Administered 2017-06-28: 0.1 mg via INTRAVENOUS

## 2017-06-28 MED ORDER — GLYCOPYRROLATE 0.2 MG/ML IJ SOLN
INTRAMUSCULAR | Status: AC
  Filled 2017-06-28: qty 1

## 2017-06-28 MED ORDER — PROPOFOL 500 MG/50ML IV EMUL
INTRAVENOUS | Status: AC
  Filled 2017-06-28: qty 50

## 2017-06-28 MED ORDER — MIDAZOLAM HCL 2 MG/2ML IJ SOLN
INTRAMUSCULAR | Status: AC
Start: 2017-06-28 — End: 2017-06-28
  Filled 2017-06-28: qty 2

## 2017-06-28 MED ORDER — MIDAZOLAM HCL 2 MG/2ML IJ SOLN
INTRAMUSCULAR | Status: DC | PRN
  Administered 2017-06-28: 2 mg via INTRAVENOUS

## 2017-06-28 MED ORDER — LIDOCAINE HCL (CARDIAC) 20 MG/ML IV SOLN
INTRAVENOUS | Status: DC | PRN
  Administered 2017-06-28: 30 mg via INTRAVENOUS

## 2017-06-28 MED ORDER — PROPOFOL 500 MG/50ML IV EMUL
INTRAVENOUS | Status: AC
Start: 2017-06-28 — End: 2017-06-28
  Filled 2017-06-28: qty 50

## 2017-06-28 MED ORDER — IPRATROPIUM-ALBUTEROL 0.5-2.5 (3) MG/3ML IN SOLN
3.0000 mL | Freq: Once | RESPIRATORY_TRACT | Status: AC
  Administered 2017-06-28: 3 mL via RESPIRATORY_TRACT

## 2017-06-28 MED ORDER — SODIUM CHLORIDE 0.9 % IV SOLN
INTRAVENOUS | Status: DC
  Administered 2017-06-28: 1000 mL via INTRAVENOUS

## 2017-06-28 MED ORDER — PROPOFOL 500 MG/50ML IV EMUL
INTRAVENOUS | Status: DC | PRN
  Administered 2017-06-28: 120 ug/kg/min via INTRAVENOUS

## 2017-06-28 MED ORDER — LIDOCAINE HCL (PF) 2 % IJ SOLN
INTRAMUSCULAR | Status: AC
  Filled 2017-06-28: qty 2

## 2017-06-28 MED ORDER — FENTANYL CITRATE (PF) 100 MCG/2ML IJ SOLN
INTRAMUSCULAR | Status: DC | PRN
  Administered 2017-06-28 (×2): 50 ug via INTRAVENOUS

## 2017-06-28 MED ORDER — IPRATROPIUM-ALBUTEROL 0.5-2.5 (3) MG/3ML IN SOLN
RESPIRATORY_TRACT | Status: AC
  Filled 2017-06-28: qty 3

## 2017-06-28 MED ORDER — PHENYLEPHRINE HCL 10 MG/ML IJ SOLN
INTRAMUSCULAR | Status: DC | PRN
  Administered 2017-06-28 (×2): 50 ug via INTRAVENOUS

## 2017-06-28 MED ORDER — FENTANYL CITRATE (PF) 100 MCG/2ML IJ SOLN
INTRAMUSCULAR | Status: AC
  Filled 2017-06-28: qty 2

## 2017-06-28 MED ORDER — SODIUM CHLORIDE 0.9 % IV SOLN: INTRAVENOUS | Status: DC

## 2017-06-28 NOTE — Anesthesia Postprocedure Evaluation (Signed)
Anesthesia Post Note  Patient: Gloria Jones  Procedure(s) Performed: Procedure(s) (LRB): ESOPHAGOGASTRODUODENOSCOPY (EGD) WITH PROPOFOL (N/A) COLONOSCOPY WITH PROPOFOL (N/A)  Patient location during evaluation: Endoscopy Anesthesia Type: General Level of consciousness: awake and alert Pain management: pain level controlled Vital Signs Assessment: post-procedure vital signs reviewed and stable Respiratory status: spontaneous breathing, nonlabored ventilation, respiratory function stable and patient connected to nasal cannula oxygen Cardiovascular status: blood pressure returned to baseline and stable Postop Assessment: no signs of nausea or vomiting Anesthetic complications: no     Last Vitals:  Vitals:   06/28/17 1550 06/28/17 1602  BP: 130/71 (!) 108/59  Pulse: 78 79  Resp: 16 18  Temp:      Last Pain:  Vitals:   06/28/17 1520  TempSrc: Tympanic                 Havoc Sanluis S

## 2017-06-28 NOTE — Op Note (Signed)
[facility] Patient Name: Gloria Jones Procedure Date: 06/28/2017 2:11 PM MRN: [id number] Account #: [id number] Date of Birth: 1961/06/12 Admit Type: Outpatient Age: 56 Room: [facility] ENDO ROOM 3 Gender: Female Note Status: Finalized Procedure:            Colonoscopy Indications:          Lower abdominal pain, Rectal bleeding, Personal history                        of colonic polyps Providers:            Lollie Sails, MD Referring MD:         No Local Md, MD (Referring MD) Medicines:            Monitored Anesthesia Care Complications:        No immediate complications. Procedure:            Pre-Anesthesia Assessment:                       - ASA Grade Assessment: III - A patient with severe                        systemic disease.                       After obtaining informed consent, the colonoscope was                        passed under direct vision. Throughout the procedure,                        the patient's blood pressure, pulse, and oxygen                        saturations were monitored continuously. The                        Colonoscope was introduced through the anus and                        advanced to the the cecum, identified by appendiceal                        orifice and ileocecal valve. The colonoscopy was                        unusually difficult due to significant looping.                        Successful completion of the procedure was aided by                        changing the patient to a supine position, changing the                        patient to a prone position and using manual pressure.                        The quality of the bowel preparation was fair. Findings:      A 2 mm polyp was found in the  descending colon. The polyp was sessile.       The polyp was removed with a cold biopsy forceps. Resection and       retrieval were complete.      A 2 mm polyp was found in the cecum. The polyp was  flat. The polyp was       removed with a cold biopsy forceps. Resection and retrieval were       complete.      Two flat polyps were found in the descending colon. The polyps were 2 to       3 mm in size. These polyps were removed with a cold biopsy forceps.       Resection and retrieval were complete.      Three sessile polyps were found in the sigmoid colon. The polyps were 1       to 2 mm in size. These polyps were removed with a cold biopsy forceps.       Resection and retrieval were complete.      A 4 mm polyp was found in the rectum. The polyp was sessile. The polyp       was removed with a cold biopsy forceps. Resection and retrieval were       complete.      The digital rectal exam was normal.      Non-bleeding internal hemorrhoids were found during anoscopy. The       hemorrhoids were small and Grade I (internal hemorrhoids that do not       prolapse). Impression:           - Preparation of the colon was fair.                       - One 2 mm polyp in the descending colon, removed with                        a cold biopsy forceps. Resected and retrieved.                       - One 2 mm polyp in the cecum, removed with a cold                        biopsy forceps. Resected and retrieved.                       - Two 2 to 3 mm polyps in the descending colon, removed                        with a cold biopsy forceps. Resected and retrieved.                       - Three 1 to 2 mm polyps in the sigmoid colon, removed                        with a cold biopsy forceps. Resected and retrieved.                       - One 4 mm polyp in the rectum, removed with a cold                        biopsy forceps. Resected  and retrieved.                       - Non-bleeding internal hemorrhoids. Recommendation:       - Await pathology results.                       - Telephone GI clinic for pathology results in 1 week.                       - Return to GI office in 5 weeks. Procedure Code(s):     --- Professional ---                       [phone], Colonoscopy, flexible; with biopsy, single or                        multiple Diagnosis Code(s):    --- Professional ---                       K64.0, First degree hemorrhoids                       D12.4, Benign neoplasm of descending colon                       D12.0, Benign neoplasm of cecum                       K62.1, Rectal polyp                       D12.5, Benign neoplasm of sigmoid colon                       R10.30, Lower abdominal pain, unspecified                       K62.5, Hemorrhage of anus and rectum                       Z86.010, Personal history of colonic polyps CPT copyright 2016 [employer]. All rights reserved. The codes documented in this report are preliminary and upon coder review may  be revised to meet current compliance requirements. Lollie Sails, MD 06/28/2017 3:19:54 PM This report has been signed electronically. Number of Addenda: 0 Note Initiated On: 06/28/2017 2:11 PM Scope Withdrawal Time: 0 hours 14 minutes 35 seconds  Total Procedure Duration: 0 hours 36 minutes 50 seconds       [facility]

## 2017-06-28 NOTE — Op Note (Signed)
[facility] Gastroenterology Patient Name: Gloria Jones Procedure Date: 06/28/2017 2:12 PM MRN: [id number] Account #: [id number] Date of Birth: 26-Mar-1961 Admit Type: Outpatient Age: 56 Room: [facility] ENDO ROOM 3 Gender: Female Note Status: Finalized Procedure:            Upper GI endoscopy Indications:          Generalized abdominal pain, Dyspepsia Providers:            Lollie Sails, MD Referring MD:         No Local Md, MD (Referring MD) Medicines:            Monitored Anesthesia Care Complications:        No immediate complications. Procedure:            Pre-Anesthesia Assessment:                       - ASA Grade Assessment: III - A patient with severe                        systemic disease.                       After obtaining informed consent, the endoscope was                        passed under direct vision. Throughout the procedure,                        the patient's blood pressure, pulse, and oxygen                        saturations were monitored continuously. The Endoscope                        was introduced through the mouth, and advanced to the                        third part of duodenum. The upper GI endoscopy was                        accomplished without difficulty. The patient tolerated                        the procedure well. Findings:      LA Grade C (one or more mucosal breaks continuous between tops of 2 or       more mucosal folds, less than 75% circumference) esophagitis with no       bleeding was found. Biopsies were taken with a cold forceps for       histology.      The exam of the esophagus was otherwise normal.      Patchy minimal inflammation characterized by congestion (edema) and       erythema was found in the gastric body and in the gastric antrum.       Biopsies were taken with a cold forceps for histology. Biopsies were       taken with a cold forceps for Helicobacter pylori testing.      The examined  duodenum was normal. Of note, there is a sharp angulation       in the third portion that I was  unable to pass beyond. Impression:           - LA Grade C erosive esophagitis. Biopsied.                       - Gastritis. Biopsied.                       - Normal examined duodenum. Recommendation:       - Await pathology results.                       - Use Protonix (pantoprazole) 40 mg PO daily daily.                       - Observe patient's clinical course.                       - Return to GI clinic in 5 weeks. Procedure Code(s):    --- Professional ---                       [phone], Esophagogastroduodenoscopy, flexible, transoral;                        with biopsy, single or multiple Diagnosis Code(s):    --- Professional ---                       K20.8, Other esophagitis                       K29.70, Gastritis, unspecified, without bleeding                       R10.84, Generalized abdominal pain                       R10.13, Epigastric pain CPT copyright 2016 American Medical Association. All rights reserved. The codes documented in this report are preliminary and upon coder review may  be revised to meet current compliance requirements. Lollie Sails, MD 06/28/2017 2:36:40 PM This report has been signed electronically. Number of Addenda: 0 Note Initiated On: 06/28/2017 2:12 PM      [facility]

## 2017-06-28 NOTE — Transfer of Care (Signed)
Immediate Anesthesia Transfer of Care Note  Patient: Dilia Reddington  Procedure(s) Performed: Procedure(s): ESOPHAGOGASTRODUODENOSCOPY (EGD) WITH PROPOFOL (N/A) COLONOSCOPY WITH PROPOFOL (N/A)  Patient Location: PACU  Anesthesia Type:General  Level of Consciousness: awake and sedated  Airway & Oxygen Therapy: Patient Spontanous Breathing and Patient connected to nasal cannula oxygen  Post-op Assessment: Report given to RN and Post -op Vital signs reviewed and stable  Post vital signs: Reviewed and stable  Last Vitals:  Vitals:   06/28/17 1318  BP: 128/83  Pulse: 87  Resp: (!) 94  Temp: 36.8 C    Last Pain:  Vitals:   06/28/17 1318  TempSrc: Tympanic      Patients Stated Pain Goal: 0 (45/84/83 5075)  Complications: No apparent anesthesia complications

## 2017-06-28 NOTE — Anesthesia Post-op Follow-up Note (Cosign Needed)
Anesthesia QCDR form completed.        

## 2017-06-28 NOTE — Anesthesia Procedure Notes (Signed)
Performed by: COOK-MARTIN, Kue Fox Pre-anesthesia Checklist: Patient identified, Emergency Drugs available, Suction available, Patient being monitored and Timeout performed Patient Re-evaluated:Patient Re-evaluated prior to inductionOxygen Delivery Method: Nasal cannula Preoxygenation: Pre-oxygenation with 100% oxygen Intubation Type: IV induction Airway Equipment and Method: Bite block Placement Confirmation: CO2 detector and positive ETCO2     

## 2017-06-28 NOTE — H&P (Signed)
Outpatient short stay form Pre-procedure 06/28/2017 2:10 PM Lollie Sails MD  Primary Physician: Dr Salome Holmes  Reason for visit:  EGD and colonoscopy  History of present illness:  Patient is a 56 year old female presenting today as above. History of some occasional rectal bleeding. There is also been some bright red blood on toilet paper as well as stool. This has happened several times several months ago. This seems to improve. She also gets some abdominal pain in the across the midabdomen.  She does apparently have a history of frequent NSAID use including Aleve or Advil sometimes Goody powders sometimes other aspirin products. She does take 81 mg aspirin daily. She does have a history of colon polyps.    Current Facility-Administered Medications:  .  0.9 %  sodium chloride infusion, , Intravenous, Continuous, Lollie Sails, MD, Last Rate: 20 mL/hr at 06/28/17 1321, 1,000 mL at 06/28/17 1321 .  0.9 %  sodium chloride infusion, , Intravenous, Continuous, Lollie Sails, MD .  ipratropium-albuterol (DUONEB) 0.5-2.5 (3) MG/3ML nebulizer solution, , , ,   Prescriptions Prior to Admission  Medication Sig Dispense Refill Last Dose  . albuterol (PROVENTIL HFA;VENTOLIN HFA) 108 (90 BASE) MCG/ACT inhaler Inhale 1-2 puffs into the lungs every 4 (four) hours as needed for wheezing or shortness of breath. 1 Inhaler 0 06/28/2017 at Unknown time  . aspirin 81 MG tablet Take 81 mg by mouth daily.   Past Week at Unknown time  . cetirizine (ZYRTEC) 10 MG tablet Take 1 tablet (10 mg total) by mouth daily. 30 tablet 0 Past Month at Unknown time  . hydrochlorothiazide (HYDRODIURIL) 25 MG tablet Take 25 mg by mouth daily.   06/28/2017 at Unknown time  . ibuprofen (ADVIL,MOTRIN) 200 MG tablet Take 200 mg by mouth every 6 (six) hours as needed.   Past Week at Unknown time  . naproxen (NAPROSYN) 500 MG tablet Take 500 mg by mouth 2 (two) times daily with a meal.   Past Month at Unknown time  .  nicotine (RA NICOTINE) 21 mg/24hr patch Place onto the skin.   Past Week at Unknown time  . ondansetron (ZOFRAN ODT) 4 MG disintegrating tablet Allow 1-2 tablets to dissolve in your mouth every 8 hours as needed for nausea/vomiting 30 tablet 0 Past Month at Unknown time  . azithromycin (ZITHROMAX) 250 MG tablet Take 1 tablet (250 mg total) by mouth daily. Take first 2 tablets together, then 1 every day until finished. (Patient not taking: Reported on 06/28/2017) 6 tablet 0 Completed Course at Unknown time  . beclomethasone (QVAR) 80 MCG/ACT inhaler Inhale into the lungs.   06/24/2016 at Unknown time  . fluticasone (FLONASE) 50 MCG/ACT nasal spray Place into the nose.   06/24/2016 at Unknown time  . meloxicam (MOBIC) 15 MG tablet Take 1 tablet (15 mg total) by mouth daily. 30 tablet 0   . metFORMIN (GLUCOPHAGE) 500 MG tablet Take 500 mg by mouth daily before breakfast.   Completed Course at Unknown time  . montelukast (SINGULAIR) 10 MG tablet Take by mouth.   06/24/2016 at Unknown time  . orphenadrine (NORFLEX) 100 MG tablet Take 1 tablet (100 mg total) by mouth 2 (two) times daily. (Patient not taking: Reported on 06/28/2017) 60 tablet 0 Not Taking at Unknown time  . oxyCODONE-acetaminophen (ROXICET) 5-325 MG tablet Take 1-2 tablets by mouth every 4 (four) hours as needed for severe pain. (Patient not taking: Reported on 06/28/2017) 20 tablet 0 Not Taking at Unknown time  .  potassium chloride SA (K-DUR,KLOR-CON) 20 MEQ tablet Take 20 mEq by mouth 2 (two) times daily.   06/24/2016 at Unknown time  . pregabalin (LYRICA) 50 MG capsule Take by mouth.     . traMADol (ULTRAM) 50 MG tablet Take 1 tablet (50 mg total) by mouth every 6 (six) hours as needed. 15 tablet 0      Allergies  Allergen Reactions  . Lisinopril     Other reaction(s): Cough  . Losartan     Other reaction(s): Headache     Past Medical History:  Diagnosis Date  . Allergic state   . Bronchitis   . COPD (chronic obstructive pulmonary  disease) ([place])   . Hypertension     Review of systems:      Physical Exam    Heart and lungs: Regular rate and rhythm without rub or gallop, lungs are bilaterally clear.    HEENT: Normocephalic atraumatic eyes are anicteric    Other:     Pertinant exam for procedure: Soft nontender nondistended bowel sounds positive normoactive.    Planned proceedures: EGD, colonoscopy and indicated procedures. I have discussed the risks benefits and complications of procedures to include not limited to bleeding, infection, perforation and the risk of sedation and the patient wishes to proceed.    Lollie Sails, MD Gastroenterology 06/28/2017  2:10 PM

## 2017-06-28 NOTE — Anesthesia Preprocedure Evaluation (Signed)
Anesthesia Evaluation  Patient identified by MRN, date of birth, ID band Patient awake    Reviewed: Allergy & Precautions, H&P , NPO status , Patient's Chart, lab work & pertinent test results  History of Anesthesia Complications Negative for: history of anesthetic complications  Airway Mallampati: III  TM Distance: >3 FB Neck ROM: full    Dental  (+) Poor Dentition, Chipped, Missing, Upper Dentures, Partial Lower   Pulmonary neg shortness of breath, COPD, Current Smoker,           Cardiovascular Exercise Tolerance: Good hypertension, (-) angina(-) Past MI      Neuro/Psych negative neurological ROS  negative psych ROS   GI/Hepatic negative GI ROS, Neg liver ROS, neg GERD  ,  Endo/Other  negative endocrine ROS  Renal/GU negative Renal ROS  negative genitourinary   Musculoskeletal   Abdominal   Peds  Hematology negative hematology ROS (+)   Anesthesia Other Findings Past Medical History: No date: Allergic state No date: Bronchitis No date: COPD (chronic obstructive pulmonary disease) (* No date: Hypertension  Past Surgical History: No date: KNEE SURGERY Left No date: OVARY SURGERY Left     Comment: partial ovary and fallopian tube removed  BMI    Body Mass Index:  36.14 kg/m      Reproductive/Obstetrics negative OB ROS                             Anesthesia Physical Anesthesia Plan  ASA: III  Anesthesia Plan: General   Post-op Pain Management:    Induction: Intravenous  PONV Risk Score and Plan:   Airway Management Planned: Natural Airway and Nasal Cannula  Additional Equipment:   Intra-op Plan:   Post-operative Plan:   Informed Consent: I have reviewed the patients History and Physical, chart, labs and discussed the procedure including the risks, benefits and alternatives for the proposed anesthesia with the patient or authorized representative who has indicated  his/her understanding and acceptance.   Dental Advisory Given  Plan Discussed with: Anesthesiologist, CRNA and Surgeon  Anesthesia Plan Comments: (Patient consented for risks of anesthesia including but not limited to:  - adverse reactions to medications - damage to teeth, lips or other oral mucosa - sore throat or hoarseness - Damage to heart, brain, lungs or loss of life  Patient voiced understanding.)        Anesthesia Quick Evaluation

## 2017-06-29 ENCOUNTER — Encounter: Payer: Self-pay | Admitting: Gastroenterology

## 2017-06-29 NOTE — Progress Notes (Signed)
Pt to call office about back pain, she thinks she was in bed to long.

## 2017-06-30 LAB — SURGICAL PATHOLOGY

## 2017-09-01 IMAGING — CT CT HEAD W/O CM
3 series · 14 of 44 positions shown, 16 images · non-contrast
Comparison: 07/22/2009

CLINICAL DATA: Dizziness and nausea today.

EXAM:
CT HEAD WITHOUT CONTRAST
TECHNIQUE: Contiguous axial images were obtained from the base of the skull
through the vertex without intravenous contrast.

[Series 2: head wo · axial · 0.47mm/px · z∈[-140,-30]mm · 8 of 27 slices shown, 10 images]
[im 3/27  brain]
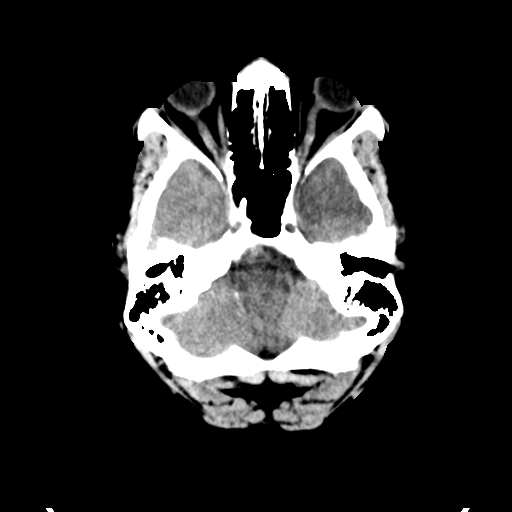
[im 3/27  bone]
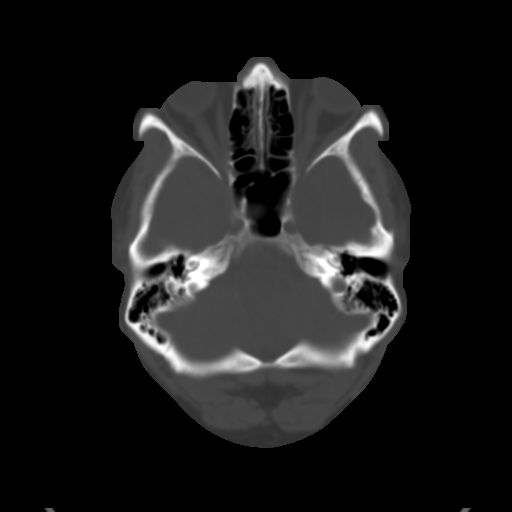
[im 6/27  brain]
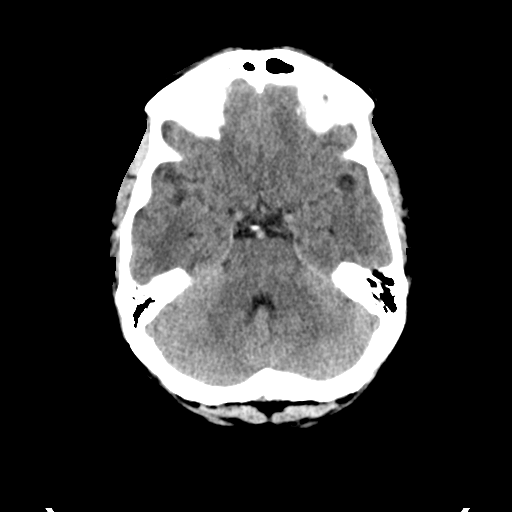
[im 9/27  brain]
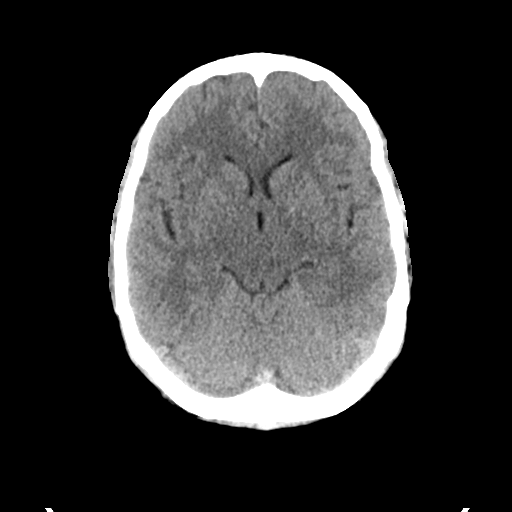
[im 12/27  brain]
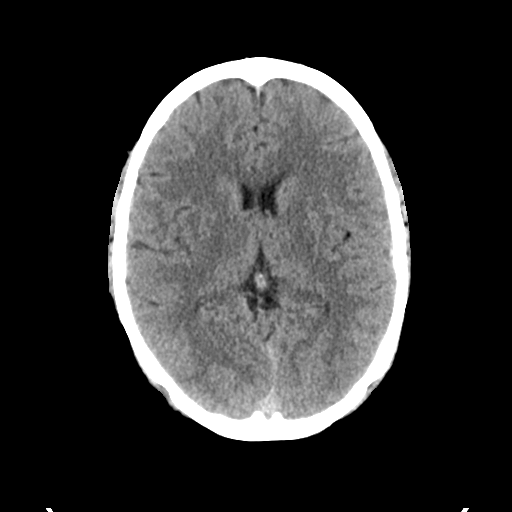
[im 16/27  brain]
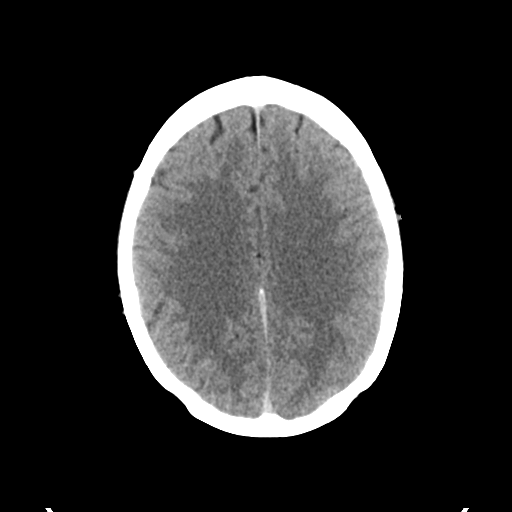
[im 16/27  bone]
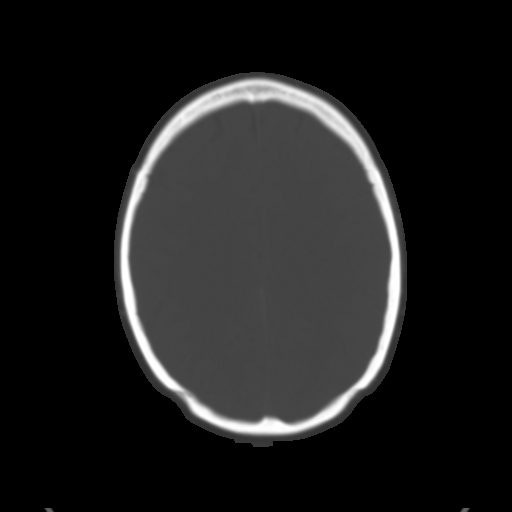
[im 19/27  brain]
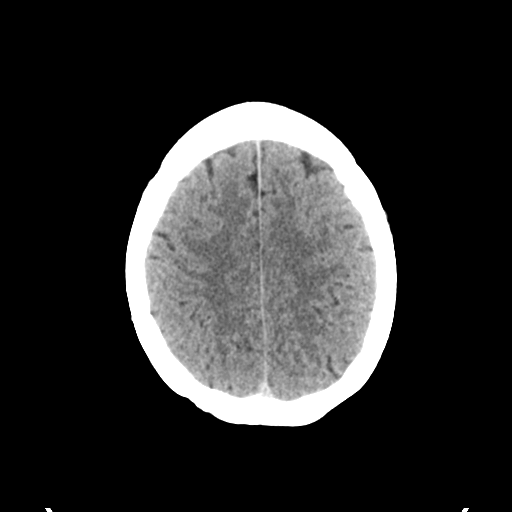
[im 22/27  brain]
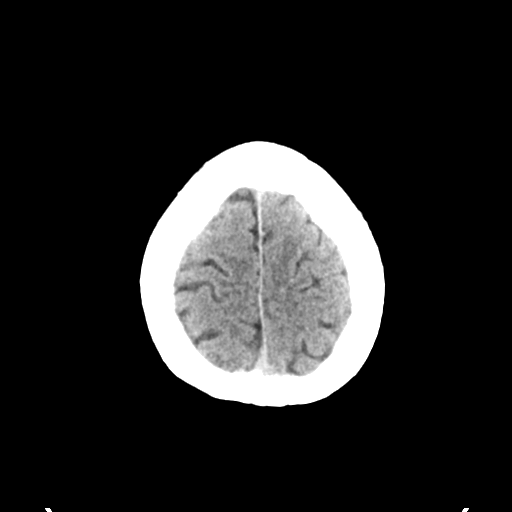
[im 25/27  brain]
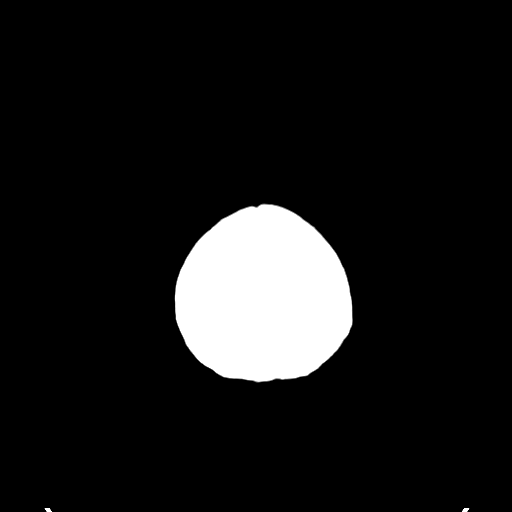

[Series 4: coronal soft tissue · coronal · 0.27mm/px · 3 of 67 slices shown]
[im 23/67  brain]
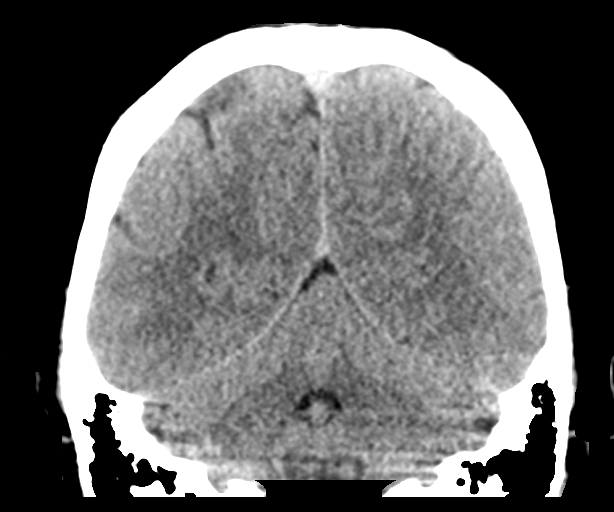
[im 30/67  brain]
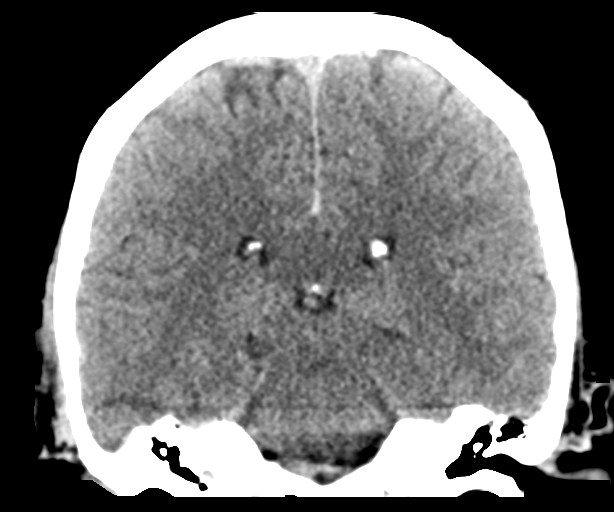
[im 37/67  brain]
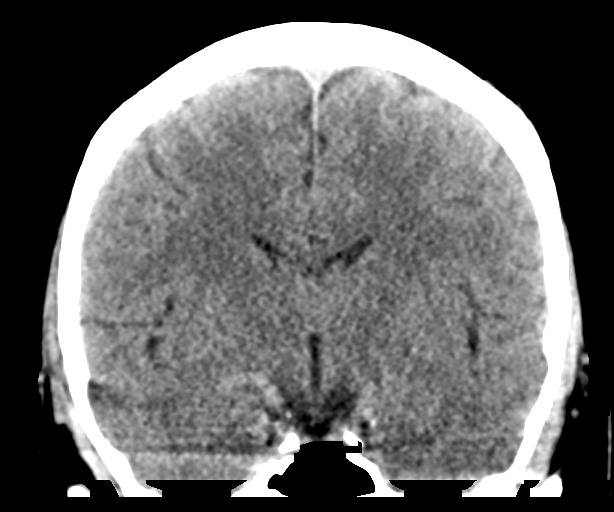

[Series 5: sagittal soft tissue · sagittal · 0.26mm/px · 3 of 49 slices shown]
[im 17/49  brain]
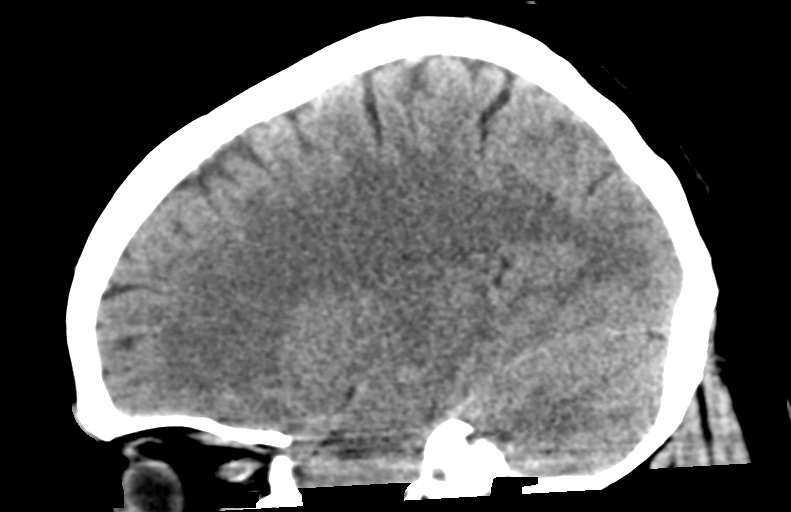
[im 25/49  brain]
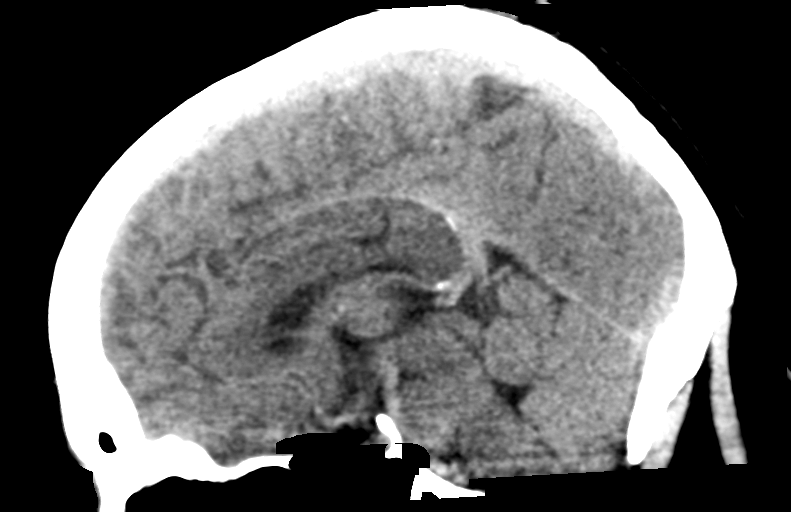
[im 33/49  brain]
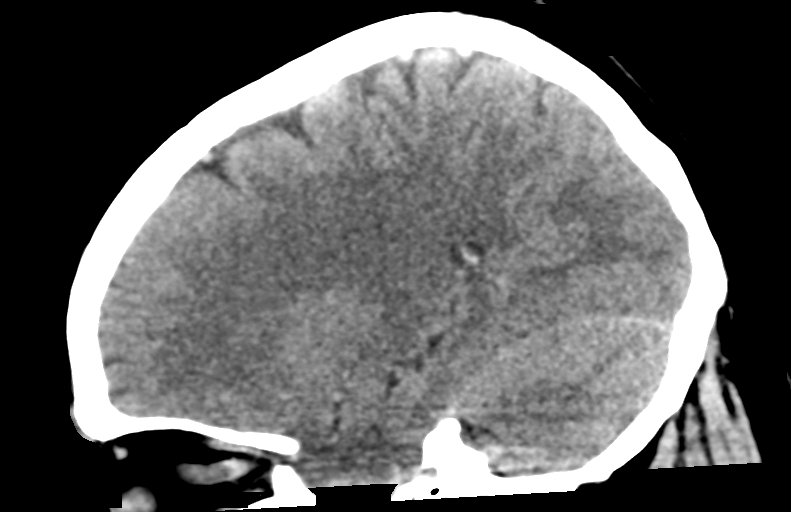

[14 of 44 positions shown; findings below may reference images not displayed]

FINDINGS: Brain: There is no evidence for acute hemorrhage, hydrocephalus,
mass lesion, or abnormal extra-axial fluid collection. No definite
CT evidence for acute infarction. Patient may be status post sub
occipital craniectomy for Chiari malformation.

Vascular: Atherosclerotic calcification is visualized in the carotid
arteries. No dense MCA sign. Major dural sinuses are unremarkable.

Skull: No evidence for skull fracture

Sinuses/Orbits: The visualized paranasal sinuses and mastoid air
cells are clear. Visualized portions of the globes and intraorbital
fat are unremarkable.

Other: N/A
IMPRESSION: Stable.  No acute intracranial abnormality.

## 2017-10-21 ENCOUNTER — Emergency Department
Admission: EM | Admit: 2017-10-21 | Discharge: 2017-10-21 | Disposition: A | Discharge status: Home / Self Care | Payer: Self-pay | Attending: Emergency Medicine | Admitting: Emergency Medicine

## 2017-10-21 ENCOUNTER — Encounter: Payer: Self-pay | Admitting: Emergency Medicine

## 2017-10-21 DIAGNOSIS — Z5321 Procedure and treatment not carried out due to patient leaving prior to being seen by health care provider: Secondary | ICD-10-CM | POA: Insufficient documentation

## 2017-10-21 LAB — POCT RAPID STREP A: Streptococcus, Group A Screen (Direct): NEGATIVE

## 2017-10-21 NOTE — ED Triage Notes (Signed)
Pt c/o sore throat, cough and nasal congestion x2 days without relief from OTC meds. Pt denies SOB and A&O x4 in triage with steady gait as she ambulates.

## 2017-10-23 LAB — CULTURE, GROUP A STREP (THRC)

## 2018-01-23 ENCOUNTER — Encounter: Payer: Self-pay | Admitting: Emergency Medicine

## 2018-01-23 ENCOUNTER — Other Ambulatory Visit: Payer: Self-pay

## 2018-01-23 ENCOUNTER — Ambulatory Visit
Admission: EM | Admit: 2018-01-23 | Discharge: 2018-01-23 | Disposition: A | Discharge status: Home / Self Care | Payer: Managed Care, Other (non HMO) | Attending: Family Medicine | Admitting: Family Medicine

## 2018-01-23 DIAGNOSIS — J441 Chronic obstructive pulmonary disease with (acute) exacerbation: Secondary | ICD-10-CM | POA: Diagnosis not present

## 2018-01-23 DIAGNOSIS — J0100 Acute maxillary sinusitis, unspecified: Secondary | ICD-10-CM

## 2018-01-23 DIAGNOSIS — J320 Chronic maxillary sinusitis: Secondary | ICD-10-CM

## 2018-01-23 MED ORDER — HYDROCOD POLST-CPM POLST ER 10-8 MG/5ML PO SUER: 5.0000 mL | Freq: Every evening | ORAL | 0 refills | Status: DC | PRN

## 2018-01-23 MED ORDER — PREDNISONE 10 MG PO TABS: ORAL_TABLET | ORAL | 0 refills | Status: DC

## 2018-01-23 MED ORDER — BENZONATATE 100 MG PO CAPS: 100.0000 mg | ORAL_CAPSULE | Freq: Three times a day (TID) | ORAL | 0 refills | Status: DC | PRN

## 2018-01-23 MED ORDER — DOXYCYCLINE HYCLATE 100 MG PO CAPS: 100.0000 mg | ORAL_CAPSULE | Freq: Two times a day (BID) | ORAL | 0 refills | Status: DC

## 2018-01-23 NOTE — Discharge Instructions (Signed)
Take medication as prescribed. Rest. Drink plenty of fluids.  ° °Follow up with your primary care physician this week as needed. Return to Urgent care for new or worsening concerns.  ° °

## 2018-01-23 NOTE — ED Provider Notes (Addendum)
[facility] ____________________________________________  Time seen: Approximately 2:00 PM  I have reviewed the triage vital signs and the nursing notes.   HISTORY  Chief Complaint Nasal Congestion  HPI Gloria Jones is a 57 y.o. female presenting for evaluation of 4-5 days of cough, congestion, sinus pressure, sinus drainage and cough.  Patient states some intermittent wheezing, similar to her COPD and has used home albuterol, with last use earlier today with improvement.  States has also tried some over-the-counter Tylenol sinus and congestion with slight improvement, no resolution.  States this felt like she had a fever but has not measured.  States fever sensation feeling was present at sickness onset.  Reports overall continues to eat and drink well.  States her grandson recently sick with some similar complaints.  Reports history of chronic sinusitis as well as COPD.  No recent prednisone or antibiotic use.  No recent COPD exacerbation.  Patient reports sinus discomfort proceeds into right ear discomfort and right upper teeth.  Does have a fractured right lower tooth, but no pain around the tooth.  Reports overall continues to remain active.  Denies other aggravating or alleviating factors. Denies chest pain, shortness of breath, abdominal pain, dysuria, extremity pain, extremity swelling or rash. Denies recent sickness. Denies recent antibiotic use.  Patient reports that her normal O2 sat is anywhere in the low 90s to mid 90s.  [place]: PCP   Past Medical History:  Diagnosis Date  . Allergic state   . Bronchitis   . COPD (chronic obstructive pulmonary disease) ([place])   . Hypertension     There are no active problems to display for this patient.   Past Surgical History:  Procedure Laterality Date  . COLONOSCOPY WITH PROPOFOL N/A 06/28/2017   Procedure: COLONOSCOPY WITH PROPOFOL;  Surgeon: Lollie Sails, MD;  Location: [facility] ENDOSCOPY;   Service: Endoscopy;  Laterality: N/A;  . ESOPHAGOGASTRODUODENOSCOPY (EGD) WITH PROPOFOL N/A 06/28/2017   Procedure: ESOPHAGOGASTRODUODENOSCOPY (EGD) WITH PROPOFOL;  Surgeon: Lollie Sails, MD;  Location: [facility] ENDOSCOPY;  Service: Endoscopy;  Laterality: N/A;  . KNEE SURGERY Left   . OVARY SURGERY Left    partial ovary and fallopian tube removed     No current facility-administered medications for this encounter.   Current Outpatient Medications:  .  albuterol (PROVENTIL HFA;VENTOLIN HFA) 108 (90 BASE) MCG/ACT inhaler, Inhale 1-2 puffs into the lungs every 4 (four) hours as needed for wheezing or shortness of breath., Disp: 1 Inhaler, Rfl: 0 .  aspirin 81 MG tablet, Take 81 mg by mouth daily., Disp: , Rfl:  .  hydrochlorothiazide (HYDRODIURIL) 25 MG tablet, Take 25 mg by mouth daily., Disp: , Rfl:  .  ibuprofen (ADVIL,MOTRIN) 200 MG tablet, Take 200 mg by mouth every 6 (six) hours as needed., Disp: , Rfl:  .  potassium chloride SA (K-DUR,KLOR-CON) 20 MEQ tablet, Take 20 mEq by mouth 2 (two) times daily., Disp: , Rfl:  .  simvastatin (ZOCOR) 20 MG tablet, Take 20 mg by mouth daily., Disp: , Rfl:  .  beclomethasone (QVAR) 80 MCG/ACT inhaler, Inhale into the lungs., Disp: , Rfl:  .  benzonatate (TESSALON PERLES) 100 MG capsule, Take 1 capsule (100 mg total) by mouth 3 (three) times daily as needed for cough., Disp: 15 capsule, Rfl: 0 .  chlorpheniramine-HYDROcodone (TUSSIONEX PENNKINETIC ER) 10-8 MG/5ML SUER, Take 5 mLs by mouth at bedtime as needed for cough. do not drive or operate machinery while taking as can cause drowsiness., Disp: 75 mL,  Rfl: 0 .  doxycycline (VIBRAMYCIN) 100 MG capsule, Take 1 capsule (100 mg total) by mouth 2 (two) times daily., Disp: 20 capsule, Rfl: 0 .  nicotine (RA NICOTINE) 21 mg/24hr patch, Place onto the skin., Disp: , Rfl:  .  predniSONE (DELTASONE) 10 MG tablet, Start 60 mg po day one, then 50 mg po day two, taper by 10 mg daily until complete., Disp: 21  tablet, Rfl: 0  Allergies Lisinopril and Losartan  Family History  Problem Relation Age of Onset  . Hypertension Mother   . Colon cancer Maternal Uncle     Social History Social History   Tobacco Use  . Smoking status: Current Every Day Smoker    Packs/day: 1.00    Types: Cigarettes  . Smokeless tobacco: Never Used  Substance Use Topics  . Alcohol use: Yes    Comment: occasionally  . Drug use: No    Review of Systems Constitutional: As above.  ENT: No sore throat. Cardiovascular: Denies chest pain. Respiratory: Denies shortness of breath. Gastrointestinal: No abdominal pain.   Genitourinary: Negative for dysuria. Musculoskeletal: Negative for back pain. Skin: Negative for rash.   ____________________________________________   PHYSICAL EXAM:  VITAL SIGNS: ED Triage Vitals  Enc Vitals Group     BP 01/23/18 1333 138/90     Pulse Rate 01/23/18 1333 78     Resp 01/23/18 1333 16     Temp 01/23/18 1333 98.3 F (36.8 C)     Temp Source 01/23/18 1333 Oral     SpO2 01/23/18 1333 93 %     Weight 01/23/18 1332 202 lb (91.6 kg)     Height 01/23/18 1332 5\' 3"  (1.6 m)     Head Circumference --      Peak Flow --      Pain Score 01/23/18 1333 8     Pain Loc --      Pain Edu? --      Excl. in [place]? --     Constitutional: Alert and oriented. Well appearing and in no acute distress. Eyes: Conjunctivae are normal. Head: Atraumatic.Mild tenderness to palpation bilateral frontal and mild to moderate bilateral tenderness maxillary sinuses. No swelling. No erythema.   Ears: no erythema, normal TMs bilaterally.   Nose: nasal congestion with bilateral nasal turbinate erythema and edema.   Mouth/Throat: Mucous membranes are moist.  Oropharynx non-erythematous.No tonsillar swelling or exudate.  Right lower frontal dental fracture without associated tenderness or surrounding tenderness, no surrounding gum erythema or swelling or palpable or visualized abscess. Neck: No stridor.  No  cervical spine tenderness to palpation. Hematological/Lymphatic/Immunilogical: No cervical lymphadenopathy. Cardiovascular: Normal rate, regular rhythm. Grossly normal heart sounds.  Good peripheral circulation. Respiratory: Normal respiratory effort.  No retractions.  No rales or rhonchi.  Mild scattered inspiratory and expiratory wheezes.  Good air movement.  Occasional dry cough noted with bronchospasm.  Speaks in complete sentences. Musculoskeletal: No lower extremity tenderness or edema noted.  No cervical, thoracic or lumbar tenderness to palpation.  Neurologic:  Normal speech and language. No gross focal neurologic deficits are appreciated. No gait instability. Skin:  Skin is warm, dry and intact. No rash noted. Psychiatric: Mood and affect are normal. Speech and behavior are normal.  ___________________________________________   LABS (all labs ordered are listed, but only abnormal results are displayed)  Labs Reviewed - No data to display   PROCEDURES Procedures    INITIAL IMPRESSION / ASSESSMENT AND PLAN / ED COURSE  Pertinent labs & imaging results that were  available during my care of the patient were reviewed by me and considered in my medical decision making (see chart for details).  Overall well-appearing patient.  No acute distress.  Suspect recent viral illness, influenza-like.  Patient out of timeframe for Tamiflu.  Suspect secondary to COPD exacerbation and exacerbation of chronic maxillary sinusitis.  Will treat patient with oral doxycycline, prednisone taper, continue home albuterol, PRN Tessalon Perles and as needed Tussionex.  Encourage rest, fluids, supportive care.  Discussed strict follow-up and return parameters.Discussed indication, risks and benefits of medications with patient.  Discussed follow up with Primary care physician this week. Discussed follow up and return parameters including no resolution or any worsening concerns. Patient verbalized understanding  and agreed to plan.   ____________________________________________   FINAL CLINICAL IMPRESSION(S) / ED DIAGNOSES  Final diagnoses:  COPD exacerbation ([place])  Chronic maxillary sinusitis     ED Discharge Orders        Ordered    predniSONE (DELTASONE) 10 MG tablet     01/23/18 1408    doxycycline (VIBRAMYCIN) 100 MG capsule  2 times daily     01/23/18 1408    benzonatate (TESSALON PERLES) 100 MG capsule  3 times daily PRN     01/23/18 1408    chlorpheniramine-HYDROcodone (TUSSIONEX PENNKINETIC ER) 10-8 MG/5ML SUER  At bedtime PRN     01/23/18 1408       Note: This dictation was prepared with Dragon dictation along with smaller phrase technology. Any transcriptional errors that result from this process are unintentional.         Marylene Land, NP 01/23/18 1736    Marylene Land, NP 01/23/18 1737

## 2018-01-23 NOTE — ED Triage Notes (Signed)
Patient in today c/o 4 day history of nasal congestion, right sided facial pain and tooth pain. Patient states she has felt feverish, but hasn't taken temperature. Patient has tried OTC Tylenol, sinus pill and BC Powder.

## 2018-01-23 NOTE — ED Triage Notes (Signed)
Patient also stating she has had a cough and chest congestion.

## 2018-03-18 ENCOUNTER — Ambulatory Visit
Admission: EM | Admit: 2018-03-18 | Discharge: 2018-03-18 | Disposition: A | Discharge status: Home / Self Care | Payer: Managed Care, Other (non HMO) | Attending: Emergency Medicine | Admitting: Emergency Medicine

## 2018-03-18 ENCOUNTER — Other Ambulatory Visit: Payer: Self-pay

## 2018-03-18 DIAGNOSIS — J302 Other seasonal allergic rhinitis: Secondary | ICD-10-CM | POA: Diagnosis not present

## 2018-03-18 DIAGNOSIS — R040 Epistaxis: Secondary | ICD-10-CM

## 2018-03-18 NOTE — ED Triage Notes (Signed)
Patient complains of nosebleed that she had this morning after bending over. Patient states that her nose had been dry before that. Patient states that this is her 2nd one in the past few days. Patient has also noticed some cold symptoms with ear pressure and headaches, body aches.

## 2018-03-18 NOTE — ED Provider Notes (Signed)
[facility]    CSN: [id number] Arrival date & time: 03/18/18  1405     History   Chief Complaint Chief Complaint  Patient presents with  . Epistaxis    HPI Gloria Jones is a 57 y.o. female.   HPI  57 year old female presents accompanied by her daughter planing of nosebleed that she had this morning.  She states that she bent over about 730 nose started bleeding from the right nostril only.  That her nose had felt dry prior to that.  She has been using a space heater in her room for heat.  Had an initial nosebleed a couple days ago from the same side.  Also had symptoms of ear pressure headaches sinus type of drainage prior to this.  Has had no fever.  She has had no chills.        Past Medical History:  Diagnosis Date  . Allergic state   . Bronchitis   . COPD (chronic obstructive pulmonary disease) ([place])   . Hypertension     There are no active problems to display for this patient.   Past Surgical History:  Procedure Laterality Date  . COLONOSCOPY WITH PROPOFOL N/A 06/28/2017   Procedure: COLONOSCOPY WITH PROPOFOL;  Surgeon: Lollie Sails, MD;  Location: [facility] ENDOSCOPY;  Service: Endoscopy;  Laterality: N/A;  . ESOPHAGOGASTRODUODENOSCOPY (EGD) WITH PROPOFOL N/A 06/28/2017   Procedure: ESOPHAGOGASTRODUODENOSCOPY (EGD) WITH PROPOFOL;  Surgeon: Lollie Sails, MD;  Location: [facility] ENDOSCOPY;  Service: Endoscopy;  Laterality: N/A;  . KNEE SURGERY Left   . OVARY SURGERY Left    partial ovary and fallopian tube removed    OB History   None      Home Medications    Prior to Admission medications   Medication Sig Start Date End Date Taking? Authorizing Provider  albuterol (PROVENTIL HFA;VENTOLIN HFA) 108 (90 BASE) MCG/ACT inhaler Inhale 1-2 puffs into the lungs every 4 (four) hours as needed for wheezing or shortness of breath. 05/24/15  Yes Betancourt, Aura Fey, NP  aspirin 81 MG tablet Take 81 mg by mouth daily.   Yes [provider]    beclomethasone (QVAR) 80 MCG/ACT inhaler Inhale into the lungs. 08/28/15 03/18/18 Yes [provider]  hydrochlorothiazide (HYDRODIURIL) 25 MG tablet Take 25 mg by mouth daily.   Yes [provider]  ibuprofen (ADVIL,MOTRIN) 200 MG tablet Take 200 mg by mouth every 6 (six) hours as needed.   Yes [provider]  potassium chloride SA (K-DUR,KLOR-CON) 20 MEQ tablet Take 20 mEq by mouth 2 (two) times daily.   Yes [provider]    Family History Family History  Problem Relation Age of Onset  . Hypertension Mother   . Colon cancer Maternal Uncle     Social History Social History   Tobacco Use  . Smoking status: Current Every Day Smoker    Packs/day: 1.00    Types: Cigarettes  . Smokeless tobacco: Never Used  Substance Use Topics  . Alcohol use: Yes    Comment: occasionally  . Drug use: Yes    Types: Marijuana     Allergies   Lisinopril and Losartan   Review of Systems Review of Systems  Constitutional: Positive for activity change. Negative for chills, fatigue and fever.  HENT: Positive for nosebleeds and postnasal drip.   All other systems reviewed and are negative.    Physical Exam Triage Vital Signs ED Triage Vitals  Enc Vitals Group     BP 03/18/18 1422 140/84  Pulse Rate 03/18/18 1422 78     Resp 03/18/18 1422 18     Temp 03/18/18 1422 98.6 F (37 C)     Temp Source 03/18/18 1422 Oral     SpO2 03/18/18 1422 91 %     Weight 03/18/18 1419 202 lb (91.6 kg)     Height 03/18/18 1419 5' 2.5" (1.588 m)     Head Circumference --      Peak Flow --      Pain Score 03/18/18 1419 1     Pain Loc --      Pain Edu? --      Excl. in [place]? --    No data found.  Updated Vital Signs BP 140/84 (BP Location: Left Arm)   Pulse 78   Temp 98.6 F (37 C) (Oral)   Resp 18   Ht 5' 2.5" (1.588 m)   Wt 202 lb (91.6 kg)   LMP 02/08/2015   SpO2 91%   BMI 36.36 kg/m   Visual Acuity Right Eye Distance:   Left Eye Distance:    Bilateral Distance:    Right Eye Near:   Left Eye Near:    Bilateral Near:     Physical Exam  Constitutional: She is oriented to person, place, and time. She appears well-developed and well-nourished. No distress.  HENT:  Head: Normocephalic.  Right Ear: External ear normal.  Left Ear: External ear normal.  Mouth/Throat: Oropharynx is clear and moist. No oropharyngeal exudate.  The nostrils shows no active bleeding present.  Patient has several small excoriations over the medial septum distally.  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  She states she has had this removed before but has regrown back.  Generally picks it and it continually grows back.  Eyes: Pupils are equal, round, and reactive to light. Right eye exhibits no discharge. Left eye exhibits no discharge.  Neck: Normal range of motion.  Pulmonary/Chest: Effort normal and breath sounds normal.  Musculoskeletal: Normal range of motion.  Neurological: She is alert and oriented to person, place, and time.  Skin: Skin is warm and dry. She is not diaphoretic.  Psychiatric: She has a normal mood and affect. Her behavior is normal. Judgment and thought content normal.  Nursing note and vitals reviewed.    UC Treatments / Results  Labs (all labs ordered are listed, but only abnormal results are displayed) Labs Reviewed - No data to display  EKG None Radiology No results found.  Procedures Procedures (including critical care time)  Medications Ordered in UC Medications - No data to display   Initial Impression / Assessment and Plan / UC Course  I have reviewed the triage vital signs and the nursing notes.  Pertinent labs & imaging results that were available during my care of the patient were reviewed by me and considered in my medical decision making (see chart for details).     Plan: 1. Test/x-ray results and diagnosis reviewed with patient 2. rx as per orders; risks, benefits, potential  side effects reviewed with patient 3. Recommend supportive treatment with continued use of the saline nasal spray as necessary.  She does have another nosebleed she may use Afrin nasal spray to try and control it.  If it does not work she should go to the emergency room.  She has another nosebleed I recommend she be seen by ENT for further evaluation of possible cauterization.  She should use a coolmist vaporizer in the room to help  moisturize the air.  Her symptoms of headache and sinus congestion I have recommended using Claritin along with Flonase 1 spray on a daily basis during the spring season. 4. F/u prn if symptoms worsen or don't improve   Final Clinical Impressions(s) / UC Diagnoses   Final diagnoses:  Epistaxis  Seasonal allergies    ED Discharge Orders    None       Controlled Substance Prescriptions [place] Controlled Substance Registry consulted? Not Applicable   Lorin Picket, PA-C 03/18/18 8315

## 2018-06-08 ENCOUNTER — Other Ambulatory Visit: Payer: Self-pay | Admitting: Family Medicine

## 2018-06-08 DIAGNOSIS — R109 Unspecified abdominal pain: Secondary | ICD-10-CM

## 2018-06-14 ENCOUNTER — Ambulatory Visit
Admission: RE | Admit: 2018-06-14 | Discharge: 2018-06-14 | Disposition: A | Discharge status: Home / Self Care | Payer: Managed Care, Other (non HMO) | Source: Ambulatory Visit | Attending: Family Medicine | Admitting: Family Medicine

## 2018-06-14 ENCOUNTER — Encounter (INDEPENDENT_AMBULATORY_CARE_PROVIDER_SITE_OTHER): Payer: Self-pay

## 2018-06-14 DIAGNOSIS — R109 Unspecified abdominal pain: Secondary | ICD-10-CM | POA: Diagnosis not present

## 2018-07-04 ENCOUNTER — Other Ambulatory Visit: Payer: Self-pay | Admitting: Family Medicine

## 2018-07-04 DIAGNOSIS — Z1231 Encounter for screening mammogram for malignant neoplasm of breast: Secondary | ICD-10-CM

## 2018-07-18 ENCOUNTER — Inpatient Hospital Stay: Admission: RE | Admit: 2018-07-18 | Payer: Managed Care, Other (non HMO) | Source: Ambulatory Visit

## 2018-07-20 ENCOUNTER — Ambulatory Visit
Admission: RE | Admit: 2018-07-20 | Discharge: 2018-07-20 | Disposition: A | Discharge status: Home / Self Care | Payer: Managed Care, Other (non HMO) | Source: Ambulatory Visit | Attending: Family Medicine | Admitting: Family Medicine

## 2018-07-20 ENCOUNTER — Encounter (INDEPENDENT_AMBULATORY_CARE_PROVIDER_SITE_OTHER): Payer: Self-pay

## 2018-07-20 DIAGNOSIS — Z1231 Encounter for screening mammogram for malignant neoplasm of breast: Secondary | ICD-10-CM

## 2018-12-22 ENCOUNTER — Ambulatory Visit (INDEPENDENT_AMBULATORY_CARE_PROVIDER_SITE_OTHER)
Admission: EM | Admit: 2018-12-22 | Discharge: 2018-12-22 | Disposition: A | Discharge status: Other Acute Inpatient Hospital | Payer: BLUE CROSS/BLUE SHIELD | Source: Home / Self Care | Attending: Family Medicine | Admitting: Family Medicine

## 2018-12-22 ENCOUNTER — Emergency Department: Payer: BLUE CROSS/BLUE SHIELD

## 2018-12-22 ENCOUNTER — Inpatient Hospital Stay
Admission: EM | Admit: 2018-12-22 | Discharge: 2018-12-24 | DRG: 190 | Disposition: A | Discharge status: Home / Self Care | Payer: BLUE CROSS/BLUE SHIELD | Attending: Internal Medicine | Admitting: Internal Medicine

## 2018-12-22 ENCOUNTER — Other Ambulatory Visit: Payer: Self-pay

## 2018-12-22 ENCOUNTER — Encounter: Payer: Self-pay | Admitting: *Deleted

## 2018-12-22 DIAGNOSIS — R51 Headache: Secondary | ICD-10-CM | POA: Diagnosis present

## 2018-12-22 DIAGNOSIS — E876 Hypokalemia: Secondary | ICD-10-CM | POA: Diagnosis present

## 2018-12-22 DIAGNOSIS — Z800 Family history of malignant neoplasm of digestive organs: Secondary | ICD-10-CM

## 2018-12-22 DIAGNOSIS — J440 Chronic obstructive pulmonary disease with acute lower respiratory infection: Secondary | ICD-10-CM | POA: Diagnosis present

## 2018-12-22 DIAGNOSIS — R519 Headache, unspecified: Secondary | ICD-10-CM

## 2018-12-22 DIAGNOSIS — I10 Essential (primary) hypertension: Secondary | ICD-10-CM | POA: Diagnosis present

## 2018-12-22 DIAGNOSIS — J449 Chronic obstructive pulmonary disease, unspecified: Secondary | ICD-10-CM | POA: Diagnosis present

## 2018-12-22 DIAGNOSIS — J9601 Acute respiratory failure with hypoxia: Secondary | ICD-10-CM | POA: Diagnosis present

## 2018-12-22 DIAGNOSIS — Z8249 Family history of ischemic heart disease and other diseases of the circulatory system: Secondary | ICD-10-CM | POA: Diagnosis not present

## 2018-12-22 DIAGNOSIS — Z79899 Other long term (current) drug therapy: Secondary | ICD-10-CM

## 2018-12-22 DIAGNOSIS — R0902 Hypoxemia: Secondary | ICD-10-CM

## 2018-12-22 DIAGNOSIS — Z888 Allergy status to other drugs, medicaments and biological substances status: Secondary | ICD-10-CM

## 2018-12-22 DIAGNOSIS — F17210 Nicotine dependence, cigarettes, uncomplicated: Secondary | ICD-10-CM | POA: Diagnosis present

## 2018-12-22 DIAGNOSIS — E669 Obesity, unspecified: Secondary | ICD-10-CM | POA: Diagnosis present

## 2018-12-22 DIAGNOSIS — J209 Acute bronchitis, unspecified: Secondary | ICD-10-CM | POA: Diagnosis present

## 2018-12-22 DIAGNOSIS — Z6837 Body mass index (BMI) 37.0-37.9, adult: Secondary | ICD-10-CM

## 2018-12-22 DIAGNOSIS — R911 Solitary pulmonary nodule: Secondary | ICD-10-CM | POA: Diagnosis present

## 2018-12-22 DIAGNOSIS — Z803 Family history of malignant neoplasm of breast: Secondary | ICD-10-CM

## 2018-12-22 DIAGNOSIS — Z716 Tobacco abuse counseling: Secondary | ICD-10-CM

## 2018-12-22 DIAGNOSIS — J441 Chronic obstructive pulmonary disease with (acute) exacerbation: Principal | ICD-10-CM | POA: Diagnosis present

## 2018-12-22 DIAGNOSIS — Z7951 Long term (current) use of inhaled steroids: Secondary | ICD-10-CM | POA: Diagnosis not present

## 2018-12-22 DIAGNOSIS — Z7982 Long term (current) use of aspirin: Secondary | ICD-10-CM | POA: Diagnosis not present

## 2018-12-22 LAB — CBC
HCT: 52.4 % — ABNORMAL HIGH (ref 36.0–46.0)
HEMOGLOBIN: 16.7 g/dL — AB (ref 12.0–15.0)
MCH: 30.8 pg (ref 26.0–34.0)
MCHC: 31.9 g/dL (ref 30.0–36.0)
MCV: 96.5 fL (ref 80.0–100.0)
PLATELETS: 237 10*3/uL (ref 150–400)
RBC: 5.43 MIL/uL — AB (ref 3.87–5.11)
RDW: 12.1 % (ref 11.5–15.5)
WBC: 13.3 10*3/uL — AB (ref 4.0–10.5)
nRBC: 0 % (ref 0.0–0.2)

## 2018-12-22 LAB — BASIC METABOLIC PANEL
Anion gap: 8 (ref 5–15)
BUN: 16 mg/dL (ref 6–20)
CO2: 32 mmol/L (ref 22–32)
CREATININE: 0.48 mg/dL (ref 0.44–1.00)
Calcium: 9.4 mg/dL (ref 8.9–10.3)
Chloride: 98 mmol/L (ref 98–111)
Glucose, Bld: 88 mg/dL (ref 70–99)
POTASSIUM: 3.8 mmol/L (ref 3.5–5.1)
Sodium: 138 mmol/L (ref 135–145)

## 2018-12-22 LAB — TROPONIN I

## 2018-12-22 LAB — INFLUENZA PANEL BY PCR (TYPE A & B)
INFLAPCR: NEGATIVE
INFLBPCR: NEGATIVE

## 2018-12-22 MED ORDER — BUDESONIDE 0.5 MG/2ML IN SUSP
0.5000 mg | Freq: Two times a day (BID) | RESPIRATORY_TRACT | Status: DC
  Administered 2018-12-23 – 2018-12-24 (×4): 0.5 mg via RESPIRATORY_TRACT
  Filled 2018-12-22 (×4): qty 2

## 2018-12-22 MED ORDER — METHYLPREDNISOLONE SODIUM SUCC 125 MG IJ SOLR
60.0000 mg | Freq: Four times a day (QID) | INTRAMUSCULAR | Status: DC
  Administered 2018-12-23 (×2): 60 mg via INTRAVENOUS
  Filled 2018-12-22 (×2): qty 2

## 2018-12-22 MED ORDER — POLYETHYLENE GLYCOL 3350 17 G PO PACK: 17.0000 g | PACK | Freq: Every day | ORAL | Status: DC | PRN

## 2018-12-22 MED ORDER — NICOTINE 14 MG/24HR TD PT24
14.0000 mg | MEDICATED_PATCH | Freq: Every day | TRANSDERMAL | Status: DC
  Administered 2018-12-23 – 2018-12-24 (×2): 14 mg via TRANSDERMAL
  Filled 2018-12-22 (×2): qty 1

## 2018-12-22 MED ORDER — ONDANSETRON HCL 4 MG/2ML IJ SOLN: 4.0000 mg | Freq: Four times a day (QID) | INTRAMUSCULAR | Status: DC | PRN

## 2018-12-22 MED ORDER — ACETAMINOPHEN 325 MG PO TABS: 650.0000 mg | ORAL_TABLET | Freq: Four times a day (QID) | ORAL | Status: DC | PRN

## 2018-12-22 MED ORDER — ONDANSETRON HCL 4 MG PO TABS: 4.0000 mg | ORAL_TABLET | Freq: Four times a day (QID) | ORAL | Status: DC | PRN

## 2018-12-22 MED ORDER — IPRATROPIUM-ALBUTEROL 0.5-2.5 (3) MG/3ML IN SOLN
3.0000 mL | Freq: Four times a day (QID) | RESPIRATORY_TRACT | Status: DC
  Administered 2018-12-23 (×2): 3 mL via RESPIRATORY_TRACT
  Filled 2018-12-22 (×2): qty 3

## 2018-12-22 MED ORDER — AZITHROMYCIN 500 MG PO TABS
500.0000 mg | ORAL_TABLET | Freq: Every day | ORAL | Status: DC
  Administered 2018-12-23 – 2018-12-24 (×2): 500 mg via ORAL
  Filled 2018-12-22 (×2): qty 1

## 2018-12-22 MED ORDER — HYDROCHLOROTHIAZIDE 25 MG PO TABS
25.0000 mg | ORAL_TABLET | Freq: Every day | ORAL | Status: DC
  Administered 2018-12-23 – 2018-12-24 (×2): 25 mg via ORAL
  Filled 2018-12-22 (×2): qty 1

## 2018-12-22 MED ORDER — ASPIRIN EC 81 MG PO TBEC
81.0000 mg | DELAYED_RELEASE_TABLET | Freq: Every day | ORAL | Status: DC
  Administered 2018-12-23 – 2018-12-24 (×2): 81 mg via ORAL
  Filled 2018-12-22 (×2): qty 1

## 2018-12-22 MED ORDER — HYDROCODONE-ACETAMINOPHEN 5-325 MG PO TABS: 1.0000 | ORAL_TABLET | ORAL | Status: DC | PRN

## 2018-12-22 MED ORDER — METOPROLOL TARTRATE 25 MG PO TABS: 37.5000 mg | ORAL_TABLET | Freq: Two times a day (BID) | ORAL | Status: DC

## 2018-12-22 MED ORDER — ALBUTEROL SULFATE (2.5 MG/3ML) 0.083% IN NEBU
2.5000 mg | INHALATION_SOLUTION | Freq: Once | RESPIRATORY_TRACT | Status: AC
  Administered 2018-12-22: 2.5 mg via RESPIRATORY_TRACT
  Filled 2018-12-22: qty 3

## 2018-12-22 MED ORDER — SODIUM CHLORIDE 0.9 % IV SOLN: 250.0000 mL | INTRAVENOUS | Status: DC | PRN

## 2018-12-22 MED ORDER — GUAIFENESIN ER 600 MG PO TB12
600.0000 mg | ORAL_TABLET | Freq: Two times a day (BID) | ORAL | Status: DC
  Administered 2018-12-23: 600 mg via ORAL
  Filled 2018-12-22: qty 1

## 2018-12-22 MED ORDER — PREDNISONE 10 MG PO TABS
50.0000 mg | ORAL_TABLET | Freq: Once | ORAL | Status: AC
  Administered 2018-12-22: 50 mg via ORAL
  Filled 2018-12-22: qty 2

## 2018-12-22 MED ORDER — IPRATROPIUM-ALBUTEROL 0.5-2.5 (3) MG/3ML IN SOLN
3.0000 mL | Freq: Once | RESPIRATORY_TRACT | Status: AC
  Administered 2018-12-22: 3 mL via RESPIRATORY_TRACT
  Filled 2018-12-22: qty 3

## 2018-12-22 MED ORDER — ACETAMINOPHEN 650 MG RE SUPP: 650.0000 mg | Freq: Four times a day (QID) | RECTAL | Status: DC | PRN

## 2018-12-22 MED ORDER — AZITHROMYCIN 500 MG PO TABS
500.0000 mg | ORAL_TABLET | Freq: Once | ORAL | Status: AC
  Administered 2018-12-22: 500 mg via ORAL
  Filled 2018-12-22: qty 1

## 2018-12-22 MED ORDER — TOPIRAMATE 25 MG PO TABS
50.0000 mg | ORAL_TABLET | Freq: Two times a day (BID) | ORAL | Status: DC | PRN
  Filled 2018-12-22: qty 2

## 2018-12-22 MED ORDER — SODIUM CHLORIDE 0.9% FLUSH
3.0000 mL | Freq: Two times a day (BID) | INTRAVENOUS | Status: DC
  Administered 2018-12-23 – 2018-12-24 (×4): 3 mL via INTRAVENOUS

## 2018-12-22 MED ORDER — ALBUTEROL SULFATE (2.5 MG/3ML) 0.083% IN NEBU
5.0000 mg | INHALATION_SOLUTION | Freq: Once | RESPIRATORY_TRACT | Status: AC
  Administered 2018-12-22: 5 mg via RESPIRATORY_TRACT
  Filled 2018-12-22: qty 6

## 2018-12-22 MED ORDER — SODIUM CHLORIDE 0.9% FLUSH: 3.0000 mL | INTRAVENOUS | Status: DC | PRN

## 2018-12-22 MED ORDER — IOHEXOL 350 MG/ML SOLN
75.0000 mL | Freq: Once | INTRAVENOUS | Status: AC | PRN
  Administered 2018-12-22: 75 mL via INTRAVENOUS

## 2018-12-22 MED ORDER — POTASSIUM CHLORIDE CRYS ER 20 MEQ PO TBCR
20.0000 meq | EXTENDED_RELEASE_TABLET | Freq: Two times a day (BID) | ORAL | Status: DC
  Administered 2018-12-23 – 2018-12-24 (×3): 20 meq via ORAL
  Filled 2018-12-22: qty 1
  Filled 2018-12-22: qty 2
  Filled 2018-12-22: qty 1

## 2018-12-22 MED ORDER — ENOXAPARIN SODIUM 40 MG/0.4ML ~~LOC~~ SOLN: 40.0000 mg | SUBCUTANEOUS | Status: DC

## 2018-12-22 NOTE — Discharge Instructions (Signed)
Patient was hypoxic on presentation.  She dropped into the 80s with ambulation.  She required oxygen here.  Patient needs oxygen at home.  Thank you  Dr. Lacinda Axon

## 2018-12-22 NOTE — ED Triage Notes (Signed)
Started 1 wk ago. Facial pain, headaches, eyes watering.

## 2018-12-22 NOTE — ED Provider Notes (Signed)
[facility] Emergency Department Provider Note ____________________________________________   First MD Initiated Contact with Patient 12/22/18 2038     (approximate)  I have reviewed the triage vital signs and the nursing notes.  HISTORY  Chief Complaint Shortness of Breath  HPI Gloria Jones is a 58 y.o. female here for evaluation of shortness of breath and low oxygen  Patient reports that she felt like she had a sinus infection for about the last week, slight runny nose, congestion.  She also noticed a headache, and measured her oxygen level at home and it was unusually low at about 87% today.  This prompted to go to urgent care, they placed her on oxygen and her headache went away almost instantly, and she reports that she was sent to the ER for further evaluation.  She has noticed a little bit of wheezing and after her first breathing treatment she noticed she was wheezing a little more and her breathing felt a little better  No nausea vomiting.  No chest pain.  Did travel recently from [place].  No history of blood clots but does have a sister has had blood clots and also reports that been running in her family   Past Medical History:  Diagnosis Date  . Allergic state   . Bronchitis   . COPD (chronic obstructive pulmonary disease) ([place])   . Hypertension    There are no active problems to display for this patient.  Past Surgical History:  Procedure Laterality Date  . COLONOSCOPY WITH PROPOFOL N/A 06/28/2017   Procedure: COLONOSCOPY WITH PROPOFOL;  Surgeon: Lollie Sails, MD;  Location: [facility] ENDOSCOPY;  Service: Endoscopy;  Laterality: N/A;  . ESOPHAGOGASTRODUODENOSCOPY (EGD) WITH PROPOFOL N/A 06/28/2017   Procedure: ESOPHAGOGASTRODUODENOSCOPY (EGD) WITH PROPOFOL;  Surgeon: Lollie Sails, MD;  Location: [facility] ENDOSCOPY;  Service: Endoscopy;  Laterality: N/A;  . KNEE SURGERY Left   . OVARY SURGERY Left    partial ovary and fallopian tube  removed    Prior to Admission medications   Medication Sig Start Date End Date Taking? Authorizing Provider  albuterol (PROVENTIL HFA;VENTOLIN HFA) 108 (90 BASE) MCG/ACT inhaler Inhale 1-2 puffs into the lungs every 4 (four) hours as needed for wheezing or shortness of breath. 05/24/15  Yes Betancourt, Aura Fey, NP  aspirin 81 MG tablet Take 81 mg by mouth daily.   Yes [provider]  beclomethasone (QVAR) 80 MCG/ACT inhaler Inhale 1 puff into the lungs 2 (two) times daily.  08/28/15 12/22/18 Yes [provider]  hydrochlorothiazide (HYDRODIURIL) 25 MG tablet Take 25 mg by mouth daily.   Yes [provider]  potassium chloride SA (K-DUR,KLOR-CON) 20 MEQ tablet Take 20 mEq by mouth 2 (two) times daily.   Yes [provider]    Allergies Lisinopril and Losartan  Family History  Problem Relation Age of Onset  . Hypertension Mother   . Colon cancer Maternal Uncle   . Breast cancer Maternal Aunt        mat great aunt    Social History Social History   Tobacco Use  . Smoking status: Current Every Day Smoker    Packs/day: 1.00    Types: Cigarettes  . Smokeless tobacco: Never Used  Substance Use Topics  . Alcohol use: Yes    Comment: occasionally  . Drug use: Yes    Types: Marijuana    Review of Systems Constitutional: No fever/chills Eyes: No visual changes. ENT: No sore throat.  No sinus pain Cardiovascular: Denies  chest pain. Respiratory: See HPI.  Some mild shortness of breath the last few days.  Use breathing treatment at home, also noticed some wheezing over the last couple of days Gastrointestinal: No abdominal pain.   Genitourinary: Negative for dysuria. Musculoskeletal: Negative for back pain. Skin: Negative for rash. Neurological: Negative for headaches, areas of focal weakness or numbness.    ____________________________________________   PHYSICAL EXAM:  VITAL SIGNS: ED Triage Vitals  Enc Vitals Group     BP 12/22/18 1951 (!)  136/93     Pulse Rate 12/22/18 1811 74     Resp 12/22/18 1811 20     Temp 12/22/18 1811 98.4 F (36.9 C)     Temp Source 12/22/18 1811 Oral     SpO2 12/22/18 1811 (!) 87 %     Weight 12/22/18 1817 202 lb 13.2 oz (92 kg)     Height 12/22/18 1817 5\' 2"  (1.575 m)     Head Circumference --      Peak Flow --      Pain Score 12/22/18 1816 0     Pain Loc --      Pain Edu? --      Excl. in [place]? --     Constitutional: Alert and oriented. Well appearing and in no acute distress. Eyes: Conjunctivae are normal. Head: Atraumatic. Nose: No congestion/rhinnorhea. Mouth/Throat: Mucous membranes are moist. Neck: No stridor.  Cardiovascular: Normal rate, regular rhythm. Grossly normal heart sounds.  Good peripheral circulation. Respiratory: Normal respiratory effort.  No retractions.  Diminished lung sounds bilateral, scant end expiratory wheezing.  No accessory muscle use.  On room air oxygen saturation about 88%, improved quickly with 2 L.  Breathing quite comfortably on 2 L oxygen. Gastrointestinal: Soft and nontender. No distention. Musculoskeletal: No lower extremity tenderness nor edema. Neurologic:  Normal speech and language. No gross focal neurologic deficits are appreciated.  Skin:  Skin is warm, dry and intact. No rash noted. Psychiatric: Mood and affect are normal. Speech and behavior are normal.  ____________________________________________   LABS (all labs ordered are listed, but only abnormal results are displayed)  Labs Reviewed  CBC - Abnormal; Notable for the following components:      Result Value   WBC 13.3 (*)    RBC 5.43 (*)    Hemoglobin 16.7 (*)    HCT 52.4 (*)    All other components within normal limits  TROPONIN I  BASIC METABOLIC PANEL  INFLUENZA PANEL BY PCR (TYPE A & B)   ____________________________________________  EKG  Reviewed and entered by me at 1830 Heart rate 70 QRS 80 QTc 450 Normal sinus rhythm, no evidence of acute  ischemia. ____________________________________________  RADIOLOGY  Dg Chest 2 View  Result Date: 12/22/2018 CLINICAL DATA:  Hypoxia with lightheadedness and headache 7 days. EXAM: CHEST - 2 VIEW COMPARISON:  08/20/2013 and 10/22/2011 FINDINGS: Lungs are adequately inflated without focal airspace consolidation or effusion. 1.2 cm nodular density over the left mid to upper lung. Slight prominence of the central vascular markings. Mild cardiomegaly. Minimal degenerative change of the spine. IMPRESSION: Mild cardiomegaly with suggestion of minimal vascular congestion. 1.2 cm nodular density over the left mid to upper lung. Recommend noncontrast chest CT on an elective basis. Electronically Signed   By: Marin Olp M.D.   On: 12/22/2018 18:54   Ct Angio Chest Pe W And/or Wo Contrast  Result Date: 12/22/2018 CLINICAL DATA:  PT to ED reporting hypoxia at Urgent care. PT is 87% on RA. Placed on  2L [place] and oxygen saturation is at 95% currently. Pt reports having been "Swimy headed" with a headache x 7 days. Pt reports the headache subsided after the oxygen was placed. EXAM: CT ANGIOGRAPHY CHEST WITH CONTRAST TECHNIQUE: Multidetector CT imaging of the chest was performed using the standard protocol during bolus administration of intravenous contrast. Multiplanar CT image reconstructions and MIPs were obtained to evaluate the vascular anatomy. CONTRAST:  32mL OMNIPAQUE IOHEXOL 350 MG/ML SOLN COMPARISON:  Current chest radiograph. FINDINGS: Cardiovascular: Satisfactory opacification of the pulmonary arteries to the segmental level. No evidence of pulmonary embolism. Normal heart size. Trace pericardial effusion. Mild left coronary artery calcification. Great vessels normal in caliber. Mild atherosclerosis along the aortic arch. Branch vessels widely patent. Mediastinum/Nodes: No enlarged mediastinal, hilar, or axillary lymph nodes. Thyroid gland, trachea, and esophagus demonstrate no significant findings.  Lungs/Pleura: Mild centrilobular emphysema. There is no nodule in the left lung to correspond to the nodular opacity noted on the current chest radiograph. This was presumably due to superimposed normal structures. There is linear opacity in the anterior inferior right upper lobe, right middle lobe, left upper lobe lingula and lower lobes consistent with atelectasis and/or scarring. 2-3 mm nodule noted in the peripheral right upper lobe, image 39, series 6. No other lung nodules. No evidence of pneumonia or pulmonary edema. No pleural effusion or pneumothorax. Upper Abdomen: No acute abnormality. Musculoskeletal: No fracture or acute finding. No osteoblastic or osteolytic lesions. Review of the MIP images confirms the above findings. IMPRESSION: 1. No evidence of a pulmonary embolism. 2. No acute findings. 3. Mild lower lung zone linear atelectasis and/or scarring. 4. 3 mm right upper lobe nodule. There is no nodule in the left lung to correspond to the nodular opacity noted on the current chest radiograph. No follow-up needed if patient is low-risk. Non-contrast chest CT can be considered in 12 months if patient is high-risk. This recommendation follows the consensus statement: Guidelines for Management of Incidental Pulmonary Nodules Detected on CT Images: From the Fleischner Society 2017; Radiology 2017; 284:228-243. 5. Trace pericardial effusion. Mild aortic atherosclerosis. Mild centrilobular emphysema. Aortic Atherosclerosis (ICD10-I70.0) and Emphysema (ICD10-J43.9). Electronically Signed   By: Lajean Manes M.D.   On: 12/22/2018 21:36    Discussed with patient the nodule seen, advised her she will need to speak to her primary care doctor and make sure that she has follow-up for this with a repeat CAT scan in a year.  Patient in agreement. ____________________________________________   PROCEDURES  Procedure(s) performed: None  Procedures  Critical Care performed:  No  ____________________________________________   INITIAL IMPRESSION / ASSESSMENT AND PLAN / ED COURSE  Pertinent labs & imaging results that were available during my care of the patient were reviewed by me and considered in my medical decision making (see chart for details).   Dyspnea.  CT negative for PE.  No chest pain.  Reportedly been going on for several days, hypoxia with some wheezing.  Likely consistent with viral syndrome, sinusitis, and bronchospasm associated with underlying COPD.  She does have a O2 requirement even after 3 nebs, but her work of breathing is essentially normal except for her hypoxia.  Most started on steroids, azithromycin, because of ongoing oxygen need admit to the hospitalist for further work-up.      ____________________________________________   FINAL CLINICAL IMPRESSION(S) / ED DIAGNOSES  Final diagnoses:  Hypoxia  COPD exacerbation ([place])  Acute bronchitis, unspecified organism  Lung nodule      Note:  This document was  prepared using Systems analyst and may include unintentional dictation errors       Delman Kitten, MD 12/22/18 2228

## 2018-12-22 NOTE — Progress Notes (Signed)
Family Meeting Note  Advance Directive:yes  Today a meeting took place with the Patient.  Patient is able to participate   The following clinical team members were present during this meeting:MD  The following were discussed:Patient's diagnosis: copd, Patient's progosis: Unable to determine and Goals for treatment: Full Code  Additional follow-up to be provided: prn  Time spent during discussion:20 minutes  Gloria Placide D Cayle [place], MD  

## 2018-12-22 NOTE — ED Notes (Signed)
Patient transported to CT 

## 2018-12-22 NOTE — ED Notes (Signed)
ED Provider at bedside. 

## 2018-12-22 NOTE — ED Triage Notes (Signed)
PT to ED reporting hypoxia at Urgent care. PT is 87% on RA. Placed on 2L [place] and oxygen saturation is at 95% currently. Pt reports having been "Swimy headed" with a headache x 7 days. Pt reports the headache subsided after the oxygen was placed.

## 2018-12-22 NOTE — ED Notes (Signed)
ED TO INPATIENT HANDOFF REPORT  Name/Age/Gender Gloria Jones 58 y.o. female  Code Status   Home/SNF/Other Home  Chief Complaint Low O2  Level of Care/Admitting Diagnosis ED Disposition    ED Disposition Condition [facility]: [place] [[id number]]  Level of Care: Med-Surg [16]  Diagnosis: COPD (chronic obstructive pulmonary disease) [facility]) [[id number]]  Admitting Physician: Gorden Harms [[id number]]  Attending Physician: Gorden Harms [[id number]]  Estimated length of stay: past midnight tomorrow  Certification:: I certify this patient will need inpatient services for at least 2 midnights  PT Class (Do Not Modify): Inpatient [101]  PT Acc Code (Do Not Modify): Private [1]       Medical History Past Medical History:  Diagnosis Date  . Allergic state   . Bronchitis   . COPD (chronic obstructive pulmonary disease) ([place])   . Hypertension     Allergies Allergies  Allergen Reactions  . Lisinopril     Other reaction(s): Cough  . Losartan     Other reaction(s): Headache    IV Location/Drains/Wounds Patient Lines/Drains/Airways Status   Active Line/Drains/Airways    Name:   Placement date:   Placement time:   Site:   Days:   Peripheral IV 12/22/18 Left Forearm   12/22/18    2115    Forearm   less than 1          Labs/Imaging Results for orders placed or performed during the hospital encounter of 12/22/18 (from the past 48 hour(s))  Troponin I - ONCE - STAT     Status: None   Collection Time: 12/22/18  6:27 PM  Result Value Ref Range   Troponin I <0.03 <0.03 ng/mL    Comment: Performed at [facility], [address] [place], [place] [address]  CBC     Status: Abnormal   Collection Time: 12/22/18  6:27 PM  Result Value Ref Range   WBC 13.3 (H) 4.0 - 10.5 K/uL   RBC 5.43 (H) 3.87 - 5.11 MIL/uL   Hemoglobin 16.7 (H) 12.0 - 15.0 g/dL   HCT 52.4 (H) 36.0 - 46.0 %   MCV 96.5 80.0 - 100.0 fL   MCH 30.8 26.0  - 34.0 pg   MCHC 31.9 30.0 - 36.0 g/dL   RDW 12.1 11.5 - 15.5 %   Platelets 237 150 - 400 K/uL   nRBC 0.0 0.0 - 0.2 %    Comment: Performed at [facility], [address] [place], [place] [address]  Basic metabolic panel     Status: None   Collection Time: 12/22/18  6:27 PM  Result Value Ref Range   Sodium 138 135 - 145 mmol/L   Potassium 3.8 3.5 - 5.1 mmol/L   Chloride 98 98 - 111 mmol/L   CO2 32 22 - 32 mmol/L   Glucose, Bld 88 70 - 99 mg/dL   BUN 16 6 - 20 mg/dL   Creatinine, Ser 0.48 0.44 - 1.00 mg/dL   Calcium 9.4 8.9 - 10.3 mg/dL   GFR calc non Af Amer >60 >60 mL/min   GFR calc Af Amer >60 >60 mL/min   Anion gap 8 5 - 15    Comment: Performed at [facility], [address] [place], [place] [address]  Influenza panel by PCR (type A & B)     Status: None   Collection Time: 12/22/18  9:15 PM  Result Value Ref Range   Influenza A By PCR NEGATIVE  NEGATIVE   Influenza B By PCR NEGATIVE NEGATIVE    Comment: (NOTE) The Xpert Xpress Flu assay is intended as an aid in the diagnosis of  influenza and should not be used as a sole basis for treatment.  This  assay is FDA approved for nasopharyngeal swab specimens only. Nasal  washings and aspirates are unacceptable for Xpert Xpress Flu testing. Performed at [facility], [address] [place], [place] [address]    Dg Chest 2 View  Result Date: 12/22/2018 CLINICAL DATA:  Hypoxia with lightheadedness and headache 7 days. EXAM: CHEST - 2 VIEW COMPARISON:  08/20/2013 and 10/22/2011 FINDINGS: Lungs are adequately inflated without focal airspace consolidation or effusion. 1.2 cm nodular density over the left mid to upper lung. Slight prominence of the central vascular markings. Mild cardiomegaly. Minimal degenerative change of the spine. IMPRESSION: Mild cardiomegaly with suggestion of minimal vascular congestion. 1.2 cm nodular density over the left mid to upper lung. Recommend noncontrast chest CT on an  elective basis. Electronically Signed   By: Marin Olp M.D.   On: 12/22/2018 18:54   Ct Angio Chest Pe W And/or Wo Contrast  Result Date: 12/22/2018 CLINICAL DATA:  PT to ED reporting hypoxia at Urgent care. PT is 87% on RA. Placed on 2L [place] and oxygen saturation is at 95% currently. Pt reports having been "Swimy headed" with a headache x 7 days. Pt reports the headache subsided after the oxygen was placed. EXAM: CT ANGIOGRAPHY CHEST WITH CONTRAST TECHNIQUE: Multidetector CT imaging of the chest was performed using the standard protocol during bolus administration of intravenous contrast. Multiplanar CT image reconstructions and MIPs were obtained to evaluate the vascular anatomy. CONTRAST:  61mL OMNIPAQUE IOHEXOL 350 MG/ML SOLN COMPARISON:  Current chest radiograph. FINDINGS: Cardiovascular: Satisfactory opacification of the pulmonary arteries to the segmental level. No evidence of pulmonary embolism. Normal heart size. Trace pericardial effusion. Mild left coronary artery calcification. Great vessels normal in caliber. Mild atherosclerosis along the aortic arch. Branch vessels widely patent. Mediastinum/Nodes: No enlarged mediastinal, hilar, or axillary lymph nodes. Thyroid gland, trachea, and esophagus demonstrate no significant findings. Lungs/Pleura: Mild centrilobular emphysema. There is no nodule in the left lung to correspond to the nodular opacity noted on the current chest radiograph. This was presumably due to superimposed normal structures. There is linear opacity in the anterior inferior right upper lobe, right middle lobe, left upper lobe lingula and lower lobes consistent with atelectasis and/or scarring. 2-3 mm nodule noted in the peripheral right upper lobe, image 39, series 6. No other lung nodules. No evidence of pneumonia or pulmonary edema. No pleural effusion or pneumothorax. Upper Abdomen: No acute abnormality. Musculoskeletal: No fracture or acute finding. No osteoblastic or osteolytic  lesions. Review of the MIP images confirms the above findings. IMPRESSION: 1. No evidence of a pulmonary embolism. 2. No acute findings. 3. Mild lower lung zone linear atelectasis and/or scarring. 4. 3 mm right upper lobe nodule. There is no nodule in the left lung to correspond to the nodular opacity noted on the current chest radiograph. No follow-up needed if patient is low-risk. Non-contrast chest CT can be considered in 12 months if patient is high-risk. This recommendation follows the consensus statement: Guidelines for Management of Incidental Pulmonary Nodules Detected on CT Images: From the Fleischner Society 2017; Radiology 2017; 284:228-243. 5. Trace pericardial effusion. Mild aortic atherosclerosis. Mild centrilobular emphysema. Aortic Atherosclerosis (ICD10-I70.0) and Emphysema (ICD10-J43.9). Electronically Signed   By: Lajean Manes M.D.   On: 12/22/2018 21:36  None  Pending Labs [employer] (From admission, onward)    Start     Ordered   Signed and Held  HIV antibody (Routine Testing)  Once,   R     Signed and Held   Signed and Held  CBC  (enoxaparin (LOVENOX)    CrCl >/= 30 ml/min)  Once,   R    Comments:  Baseline for enoxaparin therapy IF NOT ALREADY DRAWN.  Notify MD if PLT < 100 K.    Signed and Held   Signed and Held  Creatinine, serum  (enoxaparin (LOVENOX)    CrCl >/= 30 ml/min)  Once,   R    Comments:  Baseline for enoxaparin therapy IF NOT ALREADY DRAWN.    Signed and Held   Signed and Held  Creatinine, serum  (enoxaparin (LOVENOX)    CrCl >/= 30 ml/min)  Weekly,   R    Comments:  while on enoxaparin therapy    Signed and Held          Vitals/Pain Today's Vitals   12/22/18 1817 12/22/18 1951 12/22/18 2100 12/22/18 2200  BP:  (!) 136/93 (!) 147/89 (!) 125/92  Pulse:  75 73 76  Resp:  (!) 21 19 (!) 21  Temp:      TempSrc:      SpO2:  95% 92% (!) 88%  Weight: 92 kg     Height: 5\' 2"  (1.575 m)     PainSc:        Isolation Precautions Droplet  precaution  Medications Medications  albuterol (PROVENTIL) (2.5 MG/3ML) 0.083% nebulizer solution 5 mg (5 mg Nebulization Given 12/22/18 1954)  predniSONE (DELTASONE) tablet 50 mg (50 mg Oral Given 12/22/18 2107)  ipratropium-albuterol (DUONEB) 0.5-2.5 (3) MG/3ML nebulizer solution 3 mL (3 mLs Nebulization Given 12/22/18 2109)  ipratropium-albuterol (DUONEB) 0.5-2.5 (3) MG/3ML nebulizer solution 3 mL (3 mLs Nebulization Given 12/22/18 2108)  azithromycin (ZITHROMAX) tablet 500 mg (500 mg Oral Given 12/22/18 2108)  iohexol (OMNIPAQUE) 350 MG/ML injection 75 mL (75 mLs Intravenous Contrast Given 12/22/18 2119)  albuterol (PROVENTIL) (2.5 MG/3ML) 0.083% nebulizer solution 2.5 mg (2.5 mg Nebulization Given 12/22/18 2232)    Mobility walks

## 2018-12-22 NOTE — ED Notes (Signed)
Saturations 88% at rest with removal of O2, pt placed back on oxygen. Meal given. Aware of plans for admission.

## 2018-12-22 NOTE — H&P (Signed)
[facility] [place] NAME: Gloria Jones    MR#:  [id number]  DATE OF BIRTH:  [birth year]  DATE OF ADMISSION:  12/22/2018  PRIMARY CARE PHYSICIAN: Shari Prows, [facility]   REQUESTING/REFERRING PHYSICIAN:   CHIEF COMPLAINT:   Chief Complaint  Patient presents with  . Shortness of Breath    HISTORY OF PRESENT ILLNESS: Gloria Jones  is a 58 y.o. female with a known history per below presenting to the emergency room with 1 week history of shortness of breath, wheezing, chest tightness, also with headache, was thought to be due to sinus headache, noted hypoxia with O2 saturation 87% on room air, also noted to be hypertensive, CT chest noted for left lung nodule, patient valuated in the emergency room, admits to smoking, family present, patient no apparent distress, patient now be admitted for acute hypoxic respiratory failure secondary to COPD exacerbation.  PAST MEDICAL HISTORY:   Past Medical History:  Diagnosis Date  . Allergic state   . Bronchitis   . COPD (chronic obstructive pulmonary disease) ([place])   . Hypertension     PAST SURGICAL HISTORY:  Past Surgical History:  Procedure Laterality Date  . COLONOSCOPY WITH PROPOFOL N/A 06/28/2017   Procedure: COLONOSCOPY WITH PROPOFOL;  Surgeon: Lollie Sails, MD;  Location: [facility] ENDOSCOPY;  Service: Endoscopy;  Laterality: N/A;  . ESOPHAGOGASTRODUODENOSCOPY (EGD) WITH PROPOFOL N/A 06/28/2017   Procedure: ESOPHAGOGASTRODUODENOSCOPY (EGD) WITH PROPOFOL;  Surgeon: Lollie Sails, MD;  Location: [facility] ENDOSCOPY;  Service: Endoscopy;  Laterality: N/A;  . KNEE SURGERY Left   . OVARY SURGERY Left    partial ovary and fallopian tube removed    SOCIAL HISTORY:  Social History   Tobacco Use  . Smoking status: Current Every Day Smoker    Packs/day: 1.00    Types: Cigarettes  . Smokeless tobacco: Never Used  Substance Use Topics  . Alcohol use: Yes    Comment: occasionally     FAMILY HISTORY:  Family History  Problem Relation Age of Onset  . Hypertension Mother   . Colon cancer Maternal Uncle   . Breast cancer Maternal Aunt        mat great aunt    DRUG ALLERGIES:  Allergies  Allergen Reactions  . Lisinopril     Other reaction(s): Cough  . Losartan     Other reaction(s): Headache    REVIEW OF SYSTEMS:   CONSTITUTIONAL: No fever, +fatigue, weakness.  EYES: No blurred or double vision.  EARS, NOSE, AND THROAT: No tinnitus or ear pain.  RESPIRATORY: + cough, shortness of breath, wheezing CARDIOVASCULAR: No chest pain, orthopnea, edema.  GASTROINTESTINAL: No nausea, vomiting, diarrhea or abdominal pain.  GENITOURINARY: No dysuria, hematuria.  ENDOCRINE: No polyuria, nocturia,  HEMATOLOGY: No anemia, easy bruising or bleeding SKIN: No rash or lesion. MUSCULOSKELETAL: No joint pain or arthritis.   NEUROLOGIC: No tingling, numbness, weakness. + Headache PSYCHIATRY: No anxiety or depression.   MEDICATIONS AT HOME:  Prior to Admission medications   Medication Sig Start Date End Date Taking? Authorizing Provider  albuterol (PROVENTIL HFA;VENTOLIN HFA) 108 (90 BASE) MCG/ACT inhaler Inhale 1-2 puffs into the lungs every 4 (four) hours as needed for wheezing or shortness of breath. 05/24/15  Yes Betancourt, Aura Fey, NP  aspirin 81 MG tablet Take 81 mg by mouth daily.   Yes [provider]  beclomethasone (QVAR) 80 MCG/ACT inhaler Inhale 1 puff into the lungs 2 (two) times daily.  08/28/15 12/22/18 Yes [provider]  hydrochlorothiazide (HYDRODIURIL) 25 MG tablet Take 25 mg by mouth daily.   Yes [provider]  potassium chloride SA (K-DUR,KLOR-CON) 20 MEQ tablet Take 20 mEq by mouth 2 (two) times daily.   Yes [provider]      PHYSICAL EXAMINATION:   VITAL SIGNS: Blood pressure (!) 125/92, pulse 76, temperature 98.1 F (36.7 C), temperature source Oral, resp. rate (!) 21, height 5\' 2"  (1.575 m), weight 92 kg, last  menstrual period 02/08/2015, SpO2 (!) 88 %.  GENERAL:  58 y.o.-year-old patient lying in the bed with no acute distress.  Obese EYES: Pupils equal, round, reactive to light and accommodation. No scleral icterus. Extraocular muscles intact.  HEENT: Head atraumatic, normocephalic. Oropharynx and nasopharynx clear.  NECK:  Supple, no jugular venous distention. No thyroid enlargement, no tenderness.  LUNGS: Diminished breath sounds throughout . No use of accessory muscles of respiration.  CARDIOVASCULAR: S1, S2 normal. No murmurs, rubs, or gallops.  ABDOMEN: Soft, nontender, nondistended. Bowel sounds present. No organomegaly or mass.  EXTREMITIES: No pedal edema, cyanosis, or clubbing.  NEUROLOGIC: Cranial nerves II through XII are intact. Muscle strength 5/5 in all extremities. Sensation intact. Gait not checked.  PSYCHIATRIC: The patient is alert and oriented x 3.  SKIN: No obvious rash, lesion, or ulcer.   LABORATORY PANEL:   CBC Recent Labs  Lab 12/22/18 1827  WBC 13.3*  HGB 16.7*  HCT 52.4*  PLT 237  MCV 96.5  MCH 30.8  MCHC 31.9  RDW 12.1   ------------------------------------------------------------------------------------------------------------------  Chemistries  Recent Labs  Lab 12/22/18 1827  NA 138  K 3.8  CL 98  CO2 32  GLUCOSE 88  BUN 16  CREATININE 0.48  CALCIUM 9.4   ------------------------------------------------------------------------------------------------------------------ estimated creatinine clearance is 81 mL/min (by C-G formula based on SCr of 0.48 mg/dL). ------------------------------------------------------------------------------------------------------------------ No results for input(s): TSH, T4TOTAL, T3FREE, THYROIDAB in the last 72 hours.  Invalid input(s): FREET3   Coagulation profile No results for input(s): INR, PROTIME in the last 168  hours. ------------------------------------------------------------------------------------------------------------------- No results for input(s): DDIMER in the last 72 hours. -------------------------------------------------------------------------------------------------------------------  Cardiac Enzymes Recent Labs  Lab 12/22/18 1827  TROPONINI <0.03   ------------------------------------------------------------------------------------------------------------------ Invalid input(s): POCBNP  ---------------------------------------------------------------------------------------------------------------  Urinalysis    Component Value Date/Time   COLORURINE YELLOW 05/24/2015 1524   APPEARANCEUR CLEAR 05/24/2015 [place] 09/29/2014 1514   LABSPEC 1.025 05/24/2015 1524   LABSPEC 1.020 09/29/2014 1514   PHURINE 5.5 05/24/2015 1524   GLUCOSEU NEGATIVE 05/24/2015 1524   GLUCOSEU NEGATIVE 09/29/2014 1514   HGBUR NEGATIVE 05/24/2015 1524   BILIRUBINUR TRACE (A) 05/24/2015 1524   BILIRUBINUR NEGATIVE 09/29/2014 1514   KETONESUR NEGATIVE 05/24/2015 1524   PROTEINUR 30 (A) 05/24/2015 1524   NITRITE NEGATIVE 05/24/2015 1524   LEUKOCYTESUR NEGATIVE 05/24/2015 1524   LEUKOCYTESUR NEGATIVE 09/29/2014 1514     RADIOLOGY: Dg Chest 2 View  Result Date: 12/22/2018 CLINICAL DATA:  Hypoxia with lightheadedness and headache 7 days. EXAM: CHEST - 2 VIEW COMPARISON:  08/20/2013 and 10/22/2011 FINDINGS: Lungs are adequately inflated without focal airspace consolidation or effusion. 1.2 cm nodular density over the left mid to upper lung. Slight prominence of the central vascular markings. Mild cardiomegaly. Minimal degenerative change of the spine. IMPRESSION: Mild cardiomegaly with suggestion of minimal vascular congestion. 1.2 cm nodular density over the left mid to upper lung. Recommend noncontrast chest CT on an elective basis. Electronically Signed   By: Marin Olp M.D.   On:  12/22/2018 18:54  Ct Angio Chest Pe W And/or Wo Contrast  Result Date: 12/22/2018 CLINICAL DATA:  PT to ED reporting hypoxia at Urgent care. PT is 87% on RA. Placed on 2L [place] and oxygen saturation is at 95% currently. Pt reports having been "Swimy headed" with a headache x 7 days. Pt reports the headache subsided after the oxygen was placed. EXAM: CT ANGIOGRAPHY CHEST WITH CONTRAST TECHNIQUE: Multidetector CT imaging of the chest was performed using the standard protocol during bolus administration of intravenous contrast. Multiplanar CT image reconstructions and MIPs were obtained to evaluate the vascular anatomy. CONTRAST:  8mL OMNIPAQUE IOHEXOL 350 MG/ML SOLN COMPARISON:  Current chest radiograph. FINDINGS: Cardiovascular: Satisfactory opacification of the pulmonary arteries to the segmental level. No evidence of pulmonary embolism. Normal heart size. Trace pericardial effusion. Mild left coronary artery calcification. Great vessels normal in caliber. Mild atherosclerosis along the aortic arch. Branch vessels widely patent. Mediastinum/Nodes: No enlarged mediastinal, hilar, or axillary lymph nodes. Thyroid gland, trachea, and esophagus demonstrate no significant findings. Lungs/Pleura: Mild centrilobular emphysema. There is no nodule in the left lung to correspond to the nodular opacity noted on the current chest radiograph. This was presumably due to superimposed normal structures. There is linear opacity in the anterior inferior right upper lobe, right middle lobe, left upper lobe lingula and lower lobes consistent with atelectasis and/or scarring. 2-3 mm nodule noted in the peripheral right upper lobe, image 39, series 6. No other lung nodules. No evidence of pneumonia or pulmonary edema. No pleural effusion or pneumothorax. Upper Abdomen: No acute abnormality. Musculoskeletal: No fracture or acute finding. No osteoblastic or osteolytic lesions. Review of the MIP images confirms the above findings.  IMPRESSION: 1. No evidence of a pulmonary embolism. 2. No acute findings. 3. Mild lower lung zone linear atelectasis and/or scarring. 4. 3 mm right upper lobe nodule. There is no nodule in the left lung to correspond to the nodular opacity noted on the current chest radiograph. No follow-up needed if patient is low-risk. Non-contrast chest CT can be considered in 12 months if patient is high-risk. This recommendation follows the consensus statement: Guidelines for Management of Incidental Pulmonary Nodules Detected on CT Images: From the Fleischner Society 2017; Radiology 2017; 284:228-243. 5. Trace pericardial effusion. Mild aortic atherosclerosis. Mild centrilobular emphysema. Aortic Atherosclerosis (ICD10-I70.0) and Emphysema (ICD10-J43.9). Electronically Signed   By: Lajean Manes M.D.   On: 12/22/2018 21:36    EKG: Orders placed or performed during the hospital encounter of 12/22/18  . ED EKG  . ED EKG    IMPRESSION AND PLAN: *Acute hypoxic respiratory failure *Acute COPD exacerbation *Chronic tobacco smoking abuse/dependency *Acute right lung nodule *Acute headache, resolved  Admit to regular nursing for bed, COPD protocol, IV Solu-Medrol with tapering as tolerated, aggressive pulmonary toilet bronchodilator therapy, inhaled corticosteroids twice daily, mucolytic agents, empiric azithromycin for 5-day course, supplemental oxygen with weaning as tolerated, and will need follow-up status post discharge with pulmonology for pulmonary function testing as well as continued surveillance of lung nodule.   All the records are reviewed and case discussed with ED provider. Management plans discussed with the patient, family and they are in agreement.  CODE STATUS:full    TOTAL TIME TAKING CARE OF THIS PATIENT: 40 minutes.    Avel Peace Salary M.D on 12/22/2018   Between 7am to 6pm - Pager - [phone]  After 6pm go to [email or web address] - password [email or web address] [email or web address]   Office  [phone]  CC: Primary care physician; [facility]   Note: This dictation was prepared with Dragon dictation along with smaller phrase technology. Any transcriptional errors that result from this process are unintentional.

## 2018-12-22 NOTE — ED Provider Notes (Signed)
[facility]    CSN: [id number] Arrival date & time: 12/22/18  1620  History   Chief Complaint Chief Complaint  Patient presents with  . Facial Pain    HPI  58 year old female presents with facial pain.  Patient arrived reporting hypoxia at home of 31.  Patient was hypoxic here and oxygen was placed.  Patient complains of a one-week history of facial pain and associated headache.  Patient thought that she had a sinus infection.  However, her headache has now subsided after oxygen administration.  She states that she has had no cough.  Endorsing shortness of breath.  No medications or interventions tried.  No other associated symptoms.  No other complaints.  PMH, Surgical Hx, Family Hx, Social History reviewed and updated as below.  Past Medical History:  Diagnosis Date  . Allergic state   . Bronchitis   . COPD (chronic obstructive pulmonary disease) ([place])   . Hypertension    Past Surgical History:  Procedure Laterality Date  . COLONOSCOPY WITH PROPOFOL N/A 06/28/2017   Procedure: COLONOSCOPY WITH PROPOFOL;  Surgeon: Lollie Sails, MD;  Location: [facility] ENDOSCOPY;  Service: Endoscopy;  Laterality: N/A;  . ESOPHAGOGASTRODUODENOSCOPY (EGD) WITH PROPOFOL N/A 06/28/2017   Procedure: ESOPHAGOGASTRODUODENOSCOPY (EGD) WITH PROPOFOL;  Surgeon: Lollie Sails, MD;  Location: [facility] ENDOSCOPY;  Service: Endoscopy;  Laterality: N/A;  . KNEE SURGERY Left   . OVARY SURGERY Left    partial ovary and fallopian tube removed    OB History   No obstetric history on file.      Home Medications    Prior to Admission medications   Medication Sig Start Date End Date Taking? Authorizing Provider  albuterol (PROVENTIL HFA;VENTOLIN HFA) 108 (90 BASE) MCG/ACT inhaler Inhale 1-2 puffs into the lungs every 4 (four) hours as needed for wheezing or shortness of breath. 05/24/15  Yes Betancourt, Aura Fey, NP  aspirin 81 MG tablet Take 81 mg by mouth daily.   Yes [provider]    hydrochlorothiazide (HYDRODIURIL) 25 MG tablet Take 25 mg by mouth daily.   Yes [provider]  potassium chloride SA (K-DUR,KLOR-CON) 20 MEQ tablet Take 20 mEq by mouth 2 (two) times daily.   Yes [provider]  beclomethasone (QVAR) 80 MCG/ACT inhaler Inhale into the lungs. 08/28/15 03/18/18  [provider]  ibuprofen (ADVIL,MOTRIN) 200 MG tablet Take 200 mg by mouth every 6 (six) hours as needed.    [provider]    Family History Family History  Problem Relation Age of Onset  . Hypertension Mother   . Colon cancer Maternal Uncle   . Breast cancer Maternal Aunt        mat great aunt    Social History Social History   Tobacco Use  . Smoking status: Current Every Day Smoker    Packs/day: 1.00    Types: Cigarettes  . Smokeless tobacco: Never Used  Substance Use Topics  . Alcohol use: Yes    Comment: occasionally  . Drug use: Yes    Types: Marijuana     Allergies   Lisinopril and Losartan   Review of Systems Review of Systems  HENT:       Facial pain.  Respiratory: Positive for shortness of breath.   Neurological: Positive for headaches.   Physical Exam Triage Vital Signs ED Triage Vitals  Enc Vitals Group     BP 12/22/18 1630 (!) 146/97     Pulse Rate 12/22/18 1630 72  Resp --      Temp 12/22/18 1630 98.2 F (36.8 C)     Temp Source 12/22/18 1630 Temporal     SpO2 12/22/18 1630 (!) 87 %     Weight 12/22/18 1634 205 lb (93 kg)     Height 12/22/18 1634 5\' 2"  (1.575 m)     Head Circumference --      Peak Flow --      Pain Score 12/22/18 1634 8     Pain Loc --      Pain Edu? --      Excl. in [place]? --    Updated Vital Signs BP (!) 146/97   Pulse 72   Temp 98.2 F (36.8 C) (Temporal)   Ht 5\' 2"  (1.575 m)   Wt 93 kg   LMP 02/08/2015   SpO2 (!) 87%   BMI 37.49 kg/m   Visual Acuity Right Eye Distance:   Left Eye Distance:   Bilateral Distance:    Right Eye Near:   Left Eye Near:    Bilateral Near:      Physical Exam Constitutional:      General: She is not in acute distress. HENT:     Head: Normocephalic and atraumatic.     Nose: Nose normal.  Eyes:     General:        Right eye: No discharge.        Left eye: No discharge.     Conjunctiva/sclera: Conjunctivae normal.  Cardiovascular:     Rate and Rhythm: Normal rate and regular rhythm.  Pulmonary:     Effort: Pulmonary effort is normal. No respiratory distress.     Breath sounds: No wheezing or rales.  Neurological:     Mental Status: She is alert.  Psychiatric:        Mood and Affect: Mood normal.        Behavior: Behavior normal.    UC Treatments / Results  Labs (all labs ordered are listed, but only abnormal results are displayed) Labs Reviewed - No data to display  EKG None  Radiology No results found.  Procedures Procedures (including critical care time)  Medications Ordered in UC Medications - No data to display  Initial Impression / Assessment and Plan / UC Course  I have reviewed the triage vital signs and the nursing notes.  Pertinent labs & imaging results that were available during my care of the patient were reviewed by me and considered in my medical decision making (see chart for details).    58 year old female presents complaints of facial pain/pressure and low oxygen saturation at home.  Patient hypoxic here.  Patient given oxygen.  Patient was ambulated without oxygen and she dropped to 80% on room air.  Patient is currently stable on room air at 92 to 93% O2 without exertion.  Patient not want to go via EMS.  She is currently stable and holding her oxygen saturation above 90%.  Her daughter-in-law will take her to the hospital.  Final Clinical Impressions(s) / UC Diagnoses   Final diagnoses:  Facial pain  Hypoxia   Discharge Instructions   None    ED Prescriptions    None     Controlled Substance Prescriptions [place] Controlled Substance Registry consulted? Not Applicable   Coral Spikes, DO 12/22/18 1722

## 2018-12-23 LAB — MAGNESIUM: Magnesium: 1.9 mg/dL (ref 1.7–2.4)

## 2018-12-23 MED ORDER — METOPROLOL TARTRATE 25 MG PO TABS
25.0000 mg | ORAL_TABLET | Freq: Two times a day (BID) | ORAL | Status: DC
  Filled 2018-12-23: qty 1

## 2018-12-23 MED ORDER — IPRATROPIUM-ALBUTEROL 0.5-2.5 (3) MG/3ML IN SOLN: 3.0000 mL | RESPIRATORY_TRACT | Status: DC | PRN

## 2018-12-23 MED ORDER — GUAIFENESIN-DM 100-10 MG/5ML PO SYRP
5.0000 mL | ORAL_SOLUTION | ORAL | Status: DC | PRN
  Filled 2018-12-23: qty 5

## 2018-12-23 MED ORDER — METHYLPREDNISOLONE SODIUM SUCC 125 MG IJ SOLR
60.0000 mg | Freq: Two times a day (BID) | INTRAMUSCULAR | Status: DC
  Administered 2018-12-24: 60 mg via INTRAVENOUS
  Filled 2018-12-23: qty 2

## 2018-12-23 NOTE — Progress Notes (Signed)
[place] [facility] [place] NAME: Gloria Jones    MR#:  [id number]  DATE OF BIRTH:  [birth year]  SUBJECTIVE:  CHIEF COMPLAINT:   Chief Complaint  Patient presents with  . Shortness of Breath   Better shortness of breath and cough, on oxygen by nasal cannula 2 L. REVIEW OF SYSTEMS:  Review of Systems  Constitutional: Negative for chills, fever and malaise/fatigue.  HENT: Negative for sore throat.   Eyes: Negative for blurred vision and double vision.  Respiratory: Positive for cough, shortness of breath and wheezing. Negative for hemoptysis, sputum production and stridor.   Cardiovascular: Negative for chest pain, palpitations, orthopnea and leg swelling.  Gastrointestinal: Negative for abdominal pain, blood in stool, diarrhea, melena, nausea and vomiting.  Genitourinary: Negative for dysuria, flank pain and hematuria.  Musculoskeletal: Negative for back pain and joint pain.  Skin: Negative for rash.  Neurological: Negative for dizziness, sensory change, focal weakness, seizures, loss of consciousness, weakness and headaches.  Endo/Heme/Allergies: Negative for polydipsia.  Psychiatric/Behavioral: Negative for depression. The patient is not nervous/anxious.     DRUG ALLERGIES:   Allergies  Allergen Reactions  . Lisinopril     Other reaction(s): Cough  . Losartan     Other reaction(s): Headache   VITALS:  Blood pressure 119/79, pulse 84, temperature 98.3 F (36.8 C), temperature source Oral, resp. rate 18, height 5\' 2"  (1.575 m), weight 92 kg, last menstrual period 02/08/2015, SpO2 93 %. PHYSICAL EXAMINATION:  Physical Exam Constitutional:      General: She is not in acute distress.    Appearance: She is obese.  HENT:     Head: Normocephalic.     Mouth/Throat:     Mouth: Mucous membranes are moist.  Eyes:     General: No scleral icterus.    Conjunctiva/sclera: Conjunctivae normal.     Pupils: Pupils are equal, round, and  reactive to light.  Neck:     Musculoskeletal: Normal range of motion and neck supple.     Vascular: No JVD.     Trachea: No tracheal deviation.  Cardiovascular:     Rate and Rhythm: Normal rate and regular rhythm.     Heart sounds: Normal heart sounds. No murmur. No gallop.   Pulmonary:     Effort: Pulmonary effort is normal. No respiratory distress.     Breath sounds: Normal breath sounds. No wheezing or rales.  Abdominal:     General: Bowel sounds are normal. There is no distension.     Palpations: Abdomen is soft.     Tenderness: There is no abdominal tenderness. There is no rebound.  Musculoskeletal: Normal range of motion.        General: No tenderness.     Right lower leg: No edema.     Left lower leg: No edema.  Skin:    Findings: No erythema or rash.  Neurological:     General: No focal deficit present.     Mental Status: She is alert and oriented to person, place, and time.     Cranial Nerves: No cranial nerve deficit.  Psychiatric:        Mood and Affect: Mood normal.    LABORATORY PANEL:  Female CBC Recent Labs  Lab 12/22/18 1827  WBC 13.3*  HGB 16.7*  HCT 52.4*  PLT 237   ------------------------------------------------------------------------------------------------------------------ Chemistries  Recent Labs  Lab 12/22/18 1827  NA 138  K 3.8  CL 98  CO2 32  GLUCOSE 88  BUN 16  CREATININE 0.48  CALCIUM 9.4   RADIOLOGY:  Dg Chest 2 View  Result Date: 12/22/2018 CLINICAL DATA:  Hypoxia with lightheadedness and headache 7 days. EXAM: CHEST - 2 VIEW COMPARISON:  08/20/2013 and 10/22/2011 FINDINGS: Lungs are adequately inflated without focal airspace consolidation or effusion. 1.2 cm nodular density over the left mid to upper lung. Slight prominence of the central vascular markings. Mild cardiomegaly. Minimal degenerative change of the spine. IMPRESSION: Mild cardiomegaly with suggestion of minimal vascular congestion. 1.2 cm nodular density over the left  mid to upper lung. Recommend noncontrast chest CT on an elective basis. Electronically Signed   By: Marin Olp M.D.   On: 12/22/2018 18:54   Ct Angio Chest Pe W And/or Wo Contrast  Result Date: 12/22/2018 CLINICAL DATA:  PT to ED reporting hypoxia at Urgent care. PT is 87% on RA. Placed on 2L [place] and oxygen saturation is at 95% currently. Pt reports having been "Swimy headed" with a headache x 7 days. Pt reports the headache subsided after the oxygen was placed. EXAM: CT ANGIOGRAPHY CHEST WITH CONTRAST TECHNIQUE: Multidetector CT imaging of the chest was performed using the standard protocol during bolus administration of intravenous contrast. Multiplanar CT image reconstructions and MIPs were obtained to evaluate the vascular anatomy. CONTRAST:  45mL OMNIPAQUE IOHEXOL 350 MG/ML SOLN COMPARISON:  Current chest radiograph. FINDINGS: Cardiovascular: Satisfactory opacification of the pulmonary arteries to the segmental level. No evidence of pulmonary embolism. Normal heart size. Trace pericardial effusion. Mild left coronary artery calcification. Great vessels normal in caliber. Mild atherosclerosis along the aortic arch. Branch vessels widely patent. Mediastinum/Nodes: No enlarged mediastinal, hilar, or axillary lymph nodes. Thyroid gland, trachea, and esophagus demonstrate no significant findings. Lungs/Pleura: Mild centrilobular emphysema. There is no nodule in the left lung to correspond to the nodular opacity noted on the current chest radiograph. This was presumably due to superimposed normal structures. There is linear opacity in the anterior inferior right upper lobe, right middle lobe, left upper lobe lingula and lower lobes consistent with atelectasis and/or scarring. 2-3 mm nodule noted in the peripheral right upper lobe, image 39, series 6. No other lung nodules. No evidence of pneumonia or pulmonary edema. No pleural effusion or pneumothorax. Upper Abdomen: No acute abnormality. Musculoskeletal: No  fracture or acute finding. No osteoblastic or osteolytic lesions. Review of the MIP images confirms the above findings. IMPRESSION: 1. No evidence of a pulmonary embolism. 2. No acute findings. 3. Mild lower lung zone linear atelectasis and/or scarring. 4. 3 mm right upper lobe nodule. There is no nodule in the left lung to correspond to the nodular opacity noted on the current chest radiograph. No follow-up needed if patient is low-risk. Non-contrast chest CT can be considered in 12 months if patient is high-risk. This recommendation follows the consensus statement: Guidelines for Management of Incidental Pulmonary Nodules Detected on CT Images: From the Fleischner Society 2017; Radiology 2017; 284:228-243. 5. Trace pericardial effusion. Mild aortic atherosclerosis. Mild centrilobular emphysema. Aortic Atherosclerosis (ICD10-I70.0) and Emphysema (ICD10-J43.9). Electronically Signed   By: Lajean Manes M.D.   On: 12/22/2018 21:36   ASSESSMENT AND PLAN:   *Acute hypoxic respiratory failure due to COPD exacerbation Continue IV Solu-Medrol, changed to prednisone tomorrow. Continue Zithromax, DuoNeb, Robitussin as needed.  Right upper lobe lung nodule.  Follow-up as outpatient.  Hypertension.  Controlled.  Continue HCTZ and Lopressor.  Hypokalemia.  Improved with potassium supplement.  Tobacco abuse.  Smoking cessation was counseled for 3 to 4 minutes.  Nicotine patch.  Obesity.  Diet control and follow-up PCP.  All the records are reviewed and case discussed with Care Management/Social Worker. Management plans discussed with the patient, her son and they are in agreement.  CODE STATUS: Full Code  TOTAL TIME TAKING CARE OF THIS PATIENT: 27 minutes.   More than 50% of the time was spent in counseling/coordination of care: YES  POSSIBLE D/C IN 2 DAYS, DEPENDING ON CLINICAL CONDITION.   Demetrios Loll M.D on 12/23/2018 at 3:36 PM  Between 7am to 6pm - Pager - [phone]  After 6pm go to  [email or web address] - [facility]

## 2018-12-23 NOTE — Progress Notes (Signed)
Report called to [place] on 1A. Patients status gone over with nurse. Patient to be transported via wheelchair on ra.

## 2018-12-23 NOTE — Progress Notes (Signed)
Assumed care of pt. Appeared anxious d/t continuous oxygen use at this time. Educated regarding healing as a process, etc. Instructed to do IS to improve breathing pattern.

## 2018-12-24 MED ORDER — PREDNISONE 20 MG PO TABS: 40.0000 mg | ORAL_TABLET | Freq: Every day | ORAL | 0 refills | Status: DC

## 2018-12-24 MED ORDER — GUAIFENESIN-DM 100-10 MG/5ML PO SYRP: 5.0000 mL | ORAL_SOLUTION | ORAL | 0 refills | Status: DC | PRN

## 2018-12-24 MED ORDER — PREDNISONE 50 MG PO TABS
50.0000 mg | ORAL_TABLET | Freq: Every day | ORAL | Status: DC
  Administered 2018-12-24: 50 mg via ORAL
  Filled 2018-12-24: qty 1

## 2018-12-24 MED ORDER — BECLOMETHASONE DIPROPIONATE 80 MCG/ACT IN AERS: 1.0000 | INHALATION_SPRAY | Freq: Two times a day (BID) | RESPIRATORY_TRACT | 2 refills | Status: DC

## 2018-12-24 NOTE — Progress Notes (Signed)
[place] [facility] [place] was admitted to the Hospital on 12/22/2018 and Discharged  12/24/2018 and should be excused from work/school   for 14 days starting 12/22/2018 , may return to work/school without any restrictions.  Demetrios Loll M.D on 12/24/2018,at 3:23 PM  [place] [facility]  [phone]

## 2018-12-24 NOTE — Progress Notes (Signed)
Pt wheeled to car by staff. 

## 2018-12-24 NOTE — Progress Notes (Signed)
Pt O2 84% while ambulating. Pt placed on 2L  nasal cannula. MD Bridgett Larsson notified.

## 2018-12-24 NOTE — Progress Notes (Signed)
DISCHARGE NOTE:  Pt given discharge instructions, scripts and work note. Pt verbalized understanding. Pts home oxygen delivered to room , pt connected to 2L. Pt waiting on ride.

## 2018-12-24 NOTE — Discharge Summary (Signed)
[place] at [place] NAME: Gloria Jones    MR#:  [id number]  DATE OF BIRTH:  February 12, 1961  DATE OF ADMISSION:  12/22/2018   ADMITTING PHYSICIAN: Gorden Harms, MD  DATE OF DISCHARGE: 12/24/2018  PRIMARY CARE PHYSICIAN: [place]   ADMISSION DIAGNOSIS:  Hypoxia [R09.02] COPD exacerbation (HCC) [J44.1] Acute bronchitis, unspecified organism [J20.9] DISCHARGE DIAGNOSIS:  Active Problems:   COPD (chronic obstructive pulmonary disease) ([place])  SECONDARY DIAGNOSIS:   Past Medical History:  Diagnosis Date  . Allergic state   . Bronchitis   . COPD (chronic obstructive pulmonary disease) ([place])   . Hypertension    HOSPITAL COURSE:  *Acute hypoxic respiratory failure due to COPD exacerbation She has been treated with IV Solu-Medrol, changed to prednisone p.o. She is also treated with Zithromax, DuoNeb, Robitussin as needed. She is on oxygen by nasal cannula 2 L.  Hypoxia with O2 saturation at 84% without oxygen.  She need home oxygen 2 L.  Right upper lobe lung nodule.  Follow-up as outpatient.  Hypertension.  Controlled.  Continue HCTZ and discontinued Lopressor.  Hypokalemia.  Improved with potassium supplement.  Tobacco abuse.  Smoking cessation was counseled for 3 to 4 minutes.  Nicotine patch.  Obesity.  Diet control and follow-up PCP.  DISCHARGE CONDITIONS:  Stable, discharge to home today. CONSULTS OBTAINED:   DRUG ALLERGIES:   Allergies  Allergen Reactions  . Lisinopril     Other reaction(s): Cough  . Losartan     Other reaction(s): Headache   DISCHARGE MEDICATIONS:   Allergies as of 12/24/2018      Reactions   Lisinopril    Other reaction(s): Cough   Losartan    Other reaction(s): Headache      Medication List    TAKE these medications   albuterol 108 (90 Base) MCG/ACT inhaler Commonly known as:  PROVENTIL HFA;VENTOLIN HFA Inhale 1-2 puffs into the lungs every 4 (four) hours as  needed for wheezing or shortness of breath.   aspirin 81 MG tablet Take 81 mg by mouth daily.   beclomethasone 80 MCG/ACT inhaler Commonly known as:  QVAR Inhale 1 puff into the lungs 2 (two) times daily.   guaiFENesin-dextromethorphan 100-10 MG/5ML syrup Commonly known as:  ROBITUSSIN DM Take 5 mLs by mouth every 4 (four) hours as needed for cough.   hydrochlorothiazide 25 MG tablet Commonly known as:  HYDRODIURIL Take 25 mg by mouth daily.   potassium chloride SA 20 MEQ tablet Commonly known as:  K-DUR,KLOR-CON Take 20 mEq by mouth 2 (two) times daily.   predniSONE 20 MG tablet Commonly known as:  DELTASONE Take 2 tablets (40 mg total) by mouth daily with breakfast.            Durable Medical Equipment  (From admission, onward)         Start     Ordered   12/24/18 1224  For home use only DME oxygen  Once    Question Answer Comment  Mode or (Route) Nasal cannula   Liters per Minute 2   Frequency Continuous (stationary and portable oxygen unit needed)   Oxygen conserving device Yes   Oxygen delivery system Gas      12/24/18 1223           DISCHARGE INSTRUCTIONS:  See AVS.  If you experience worsening of your admission symptoms, develop shortness of breath, life threatening emergency, suicidal or homicidal thoughts you must seek medical attention immediately  by calling 911 or calling your MD immediately  if symptoms less severe.  You Must read complete instructions/literature along with all the possible adverse reactions/side effects for all the Medicines you take and that have been prescribed to you. Take any new Medicines after you have completely understood and accpet all the possible adverse reactions/side effects.   Please note  You were cared for by a hospitalist during your hospital stay. If you have any questions about your discharge medications or the care you received while you were in the hospital after you are discharged, you can call the unit  and asked to speak with the hospitalist on call if the hospitalist that took care of you is not available. Once you are discharged, your primary care physician will handle any further medical issues. Please note that NO REFILLS for any discharge medications will be authorized once you are discharged, as it is imperative that you return to your primary care physician (or establish a relationship with a primary care physician if you do not have one) for your aftercare needs so that they can reassess your need for medications and monitor your lab values.    On the day of Discharge:  VITAL SIGNS:  Blood pressure (!) 154/94, pulse 81, temperature 98.7 F (37.1 C), temperature source Oral, resp. rate 18, height 5\' 2"  (1.575 m), weight 92 kg, last menstrual period 02/08/2015, SpO2 91 %. PHYSICAL EXAMINATION:  GENERAL:  58 y.o.-year-old patient lying in the bed with no acute distress.  Obesity. EYES: Pupils equal, round, reactive to light and accommodation. No scleral icterus. Extraocular muscles intact.  HEENT: Head atraumatic, normocephalic. Oropharynx and nasopharynx clear.  NECK:  Supple, no jugular venous distention. No thyroid enlargement, no tenderness.  LUNGS: Normal breath sounds bilaterally, no wheezing, rales,rhonchi or crepitation. No use of accessory muscles of respiration.  CARDIOVASCULAR: S1, S2 normal. No murmurs, rubs, or gallops.  ABDOMEN: Soft, non-tender, non-distended. Bowel sounds present. No organomegaly or mass.  EXTREMITIES: No pedal edema, cyanosis, or clubbing.  NEUROLOGIC: Cranial nerves II through XII are intact. Muscle strength 5/5 in all extremities. Sensation intact. Gait not checked.  PSYCHIATRIC: The patient is alert and oriented x 3.  SKIN: No obvious rash, lesion, or ulcer.  DATA REVIEW:   CBC Recent Labs  Lab 12/22/18 1827  WBC 13.3*  HGB 16.7*  HCT 52.4*  PLT 237    Chemistries  Recent Labs  Lab 12/22/18 1827 12/23/18 1541  NA 138  --   K 3.8  --     CL 98  --   CO2 32  --   GLUCOSE 88  --   BUN 16  --   CREATININE 0.48  --   CALCIUM 9.4  --   MG  --  1.9     Microbiology Results  Results for orders placed or performed during the hospital encounter of 10/21/17  Culture, group A strep     Status: None   Collection Time: 10/21/17 12:44 AM  Result Value Ref Range Status   Specimen Description THROAT  Final   Special Requests NONE  Final   Culture   Final    NO GROUP A STREP (S.PYOGENES) ISOLATED Performed at [facility], [place] [address] [place], [place] [address]    Report Status 10/23/2017 FINAL  Final    RADIOLOGY:  No results found.   Management plans discussed with the patient, family and they are in agreement.  CODE STATUS: Full Code   TOTAL  TIME TAKING CARE OF THIS PATIENT: 33 minutes.    Demetrios Loll M.D on 12/24/2018 at 12:54 PM  Between 7am to 6pm - Pager - [phone]  After 6pm go to [email or web address] - Proofreader  [facility]  Office  [phone]  CC: Primary care physician; [facility]   Note: This dictation was prepared with Dragon dictation along with smaller phrase technology. Any transcriptional errors that result from this process are unintentional.

## 2018-12-24 NOTE — Progress Notes (Signed)
Pt refused lovenox. Pt was educated on the benefits and risks of the medication.

## 2018-12-24 NOTE — Progress Notes (Signed)
Pt voiced concern over new order of metoprolol. Pt states she is unclear of the rationale of why she is starting this medications since she does not take it at home. Pt verbally educated on effects and purpose of metoprolol. Pt states she would like to discuss further with a MD.

## 2018-12-24 NOTE — Progress Notes (Signed)
SATURATION QUALIFICATIONS:   Patient Saturations on Room Air at Rest = 90%  Patient Saturations on Room Air while Ambulating = 86%  Patient Saturations on 2L Liters of oxygen while Ambulating = 94%  Please briefly explain why patient needs home oxygen: desaturation in O2

## 2018-12-24 NOTE — Discharge Instructions (Signed)
Smoking cessation  

## 2018-12-24 NOTE — Care Management Note (Signed)
Case Management Note  Patient Details  Name: Ineze Serrao MRN: [id number] Date of Birth: [birth year]  Subjective/Objective:  Patient with orders for DME O2. SATS are documented and there is a qualifying diagnosis. Referral with Brad at [place] care for O2. Per [place] delivery is scheduled between 3-6 pm. Patient will discharge after this.                  Action/Plan:   Expected Discharge Date:  12/24/18               Expected Discharge Plan:  Home/Self Care  In-House Referral:     Discharge planning Services  CM Consult  Post Acute Care Choice:  Durable Medical Equipment Choice offered to:  Patient  DME Arranged:  Oxygen DME Agency:  [place]:    [facility] Agency:     Status of Service:  Completed, signed off  If discussed at [place] of Stay Meetings, dates discussed:    Additional Comments:  Latanya Maudlin, RN 12/24/2018, 2:31 PM

## 2018-12-25 LAB — HIV ANTIBODY (ROUTINE TESTING W REFLEX): HIV Screen 4th Generation wRfx: NONREACTIVE

## 2019-08-01 ENCOUNTER — Other Ambulatory Visit: Payer: Self-pay | Admitting: Family Medicine

## 2019-08-02 ENCOUNTER — Other Ambulatory Visit: Payer: Self-pay | Admitting: Family Medicine

## 2019-08-02 DIAGNOSIS — R319 Hematuria, unspecified: Secondary | ICD-10-CM

## 2019-08-02 DIAGNOSIS — R1030 Lower abdominal pain, unspecified: Secondary | ICD-10-CM

## 2019-08-07 ENCOUNTER — Ambulatory Visit
Admission: RE | Admit: 2019-08-07 | Discharge: 2019-08-07 | Disposition: A | Discharge status: Home / Self Care | Payer: BC Managed Care – PPO | Source: Ambulatory Visit | Attending: Family Medicine | Admitting: Family Medicine

## 2019-08-07 ENCOUNTER — Other Ambulatory Visit: Payer: Self-pay

## 2019-08-07 DIAGNOSIS — R1030 Lower abdominal pain, unspecified: Secondary | ICD-10-CM

## 2019-08-07 DIAGNOSIS — R319 Hematuria, unspecified: Secondary | ICD-10-CM | POA: Insufficient documentation

## 2019-08-07 LAB — POCT I-STAT CREATININE: Creatinine, Ser: 0.6 mg/dL (ref 0.44–1.00)

## 2019-08-07 MED ORDER — IOHEXOL 300 MG/ML  SOLN
125.0000 mL | Freq: Once | INTRAMUSCULAR | Status: AC | PRN
  Administered 2019-08-07: 125 mL via INTRAVENOUS

## 2019-12-04 ENCOUNTER — Ambulatory Visit
Admission: EM | Admit: 2019-12-04 | Discharge: 2019-12-04 | Disposition: A | Discharge status: Home / Self Care | Payer: BC Managed Care – PPO | Attending: Family Medicine | Admitting: Family Medicine

## 2019-12-04 ENCOUNTER — Other Ambulatory Visit: Payer: Self-pay

## 2019-12-04 ENCOUNTER — Encounter: Payer: Self-pay | Admitting: Emergency Medicine

## 2019-12-04 ENCOUNTER — Ambulatory Visit (INDEPENDENT_AMBULATORY_CARE_PROVIDER_SITE_OTHER): Payer: BC Managed Care – PPO

## 2019-12-04 DIAGNOSIS — M545 Low back pain, unspecified: Secondary | ICD-10-CM

## 2019-12-04 DIAGNOSIS — W19XXXA Unspecified fall, initial encounter: Secondary | ICD-10-CM

## 2019-12-04 DIAGNOSIS — M25521 Pain in right elbow: Secondary | ICD-10-CM

## 2019-12-04 DIAGNOSIS — M62838 Other muscle spasm: Secondary | ICD-10-CM

## 2019-12-04 MED ORDER — TIZANIDINE HCL 4 MG PO TABS: 4.0000 mg | ORAL_TABLET | Freq: Three times a day (TID) | ORAL | 0 refills | Status: DC | PRN

## 2019-12-04 MED ORDER — MELOXICAM 15 MG PO TABS: 15.0000 mg | ORAL_TABLET | Freq: Every day | ORAL | 0 refills | Status: DC | PRN

## 2019-12-04 NOTE — ED Provider Notes (Signed)
[facility]    CSN: [id number] Arrival date & time: 12/04/19  1431  History   Chief Complaint Chief Complaint  Patient presents with  . Fall  . Arm Pain   HPI  58 year old female presents with the above complaints.  Patient states that on Saturday she was going up the steps and accidentally fell.  She fell forward on an outstretched right hand/arm.  She states that she injured her right shoulder, right elbow and also reports low back pain.  Denies head injury.  She does report ongoing headache.  She would like to be tested for Covid.  She has no other symptoms.  Rates her pain as 8/10 in severity.  No known relieving factors.  Elbow pain seems to be worse with extension of the elbow.  No other associated symptoms.  No other complaints.  PMH, Surgical Hx, Family Hx, Social History reviewed and updated as below.  Past Medical History:  Diagnosis Date  . Allergic state   . Bronchitis   . COPD (chronic obstructive pulmonary disease) ([place])   . Hypertension    Patient Active Problem List   Diagnosis Date Noted  . COPD (chronic obstructive pulmonary disease) ([place]) 12/22/2018   Past Surgical History:  Procedure Laterality Date  . COLONOSCOPY WITH PROPOFOL N/A 06/28/2017   Procedure: COLONOSCOPY WITH PROPOFOL;  Surgeon: Lollie Sails, MD;  Location: [facility] ENDOSCOPY;  Service: Endoscopy;  Laterality: N/A;  . ESOPHAGOGASTRODUODENOSCOPY (EGD) WITH PROPOFOL N/A 06/28/2017   Procedure: ESOPHAGOGASTRODUODENOSCOPY (EGD) WITH PROPOFOL;  Surgeon: Lollie Sails, MD;  Location: [facility] ENDOSCOPY;  Service: Endoscopy;  Laterality: N/A;  . KNEE SURGERY Left   . OVARY SURGERY Left    partial ovary and fallopian tube removed   OB History   No obstetric history on file.    Home Medications    Prior to Admission medications   Medication Sig Start Date End Date Taking? Authorizing Provider  albuterol (PROVENTIL HFA;VENTOLIN HFA) 108 (90 BASE) MCG/ACT inhaler Inhale 1-2 puffs  into the lungs every 4 (four) hours as needed for wheezing or shortness of breath. 05/24/15  Yes Betancourt, Aura Fey, NP  aspirin 81 MG tablet Take 81 mg by mouth daily.   Yes [provider]  beclomethasone (QVAR) 80 MCG/ACT inhaler Inhale 1 puff into the lungs 2 (two) times daily. 12/24/18 04/19/22 Yes Demetrios Loll, MD  simvastatin (ZOCOR) 20 MG tablet Take by mouth. 06/30/19 06/29/20 Yes [provider]  spironolactone (ALDACTONE) 25 MG tablet Take by mouth. 12/27/18 12/27/19 Yes [provider]  meloxicam (MOBIC) 15 MG tablet Take 1 tablet (15 mg total) by mouth daily as needed. 12/04/19   Coral Spikes, DO  tiZANidine (ZANAFLEX) 4 MG tablet Take 1 tablet (4 mg total) by mouth every 8 (eight) hours as needed for muscle spasms. 12/04/19   Coral Spikes, DO  hydrochlorothiazide (HYDRODIURIL) 25 MG tablet Take 25 mg by mouth daily.  12/04/19  [provider]  potassium chloride SA (K-DUR,KLOR-CON) 20 MEQ tablet Take 20 mEq by mouth 2 (two) times daily.  12/04/19  [provider]   Family History Family History  Problem Relation Age of Onset  . Hypertension Mother   . Colon cancer Maternal Uncle   . Breast cancer Maternal Aunt        mat great aunt   Social History Social History   Tobacco Use  . Smoking status: Current Every Day Smoker    Packs/day: 1.00    Types: Cigarettes  .  Smokeless tobacco: Never Used  Substance Use Topics  . Alcohol use: Not Currently    Comment: occasionally  . Drug use: Not Currently    Types: Marijuana   Allergies   Lisinopril and Losartan   Review of Systems Review of Systems  Musculoskeletal:       Right elbow pain. Right shoulder pain.  Neurological: Positive for headaches.   Physical Exam Triage Vital Signs ED Triage Vitals  Enc Vitals Group     BP 12/04/19 1445 111/69     Pulse Rate 12/04/19 1445 81     Resp 12/04/19 1445 18     Temp 12/04/19 1445 98.9 F (37.2 C)     Temp Source 12/04/19 1445 Oral      SpO2 12/04/19 1445 92 %     Weight 12/04/19 1442 190 lb (86.2 kg)     Height 12/04/19 1442 5\' 3"  (1.6 m)     Head Circumference --      Peak Flow --      Pain Score 12/04/19 1442 8     Pain Loc --      Pain Edu? --      Excl. in [place]? --    Updated Vital Signs BP 111/69 (BP Location: Right Arm)   Pulse 81   Temp 98.9 F (37.2 C) (Oral)   Resp 18   Ht 5\' 3"  (1.6 m)   Wt 86.2 kg   LMP 02/08/2015   SpO2 92%   BMI 33.66 kg/m   Visual Acuity Right Eye Distance:   Left Eye Distance:   Bilateral Distance:    Right Eye Near:   Left Eye Near:    Bilateral Near:     Physical Exam Vitals and nursing note reviewed.  Constitutional:      General: She is not in acute distress.    Appearance: Normal appearance. She is not ill-appearing.  HENT:     Head: Normocephalic and atraumatic.  Eyes:     General:        Right eye: No discharge.        Left eye: No discharge.     Conjunctiva/sclera: Conjunctivae normal.  Cardiovascular:     Rate and Rhythm: Normal rate and regular rhythm.  Pulmonary:     Effort: Pulmonary effort is normal.     Breath sounds: Wheezing present.  Musculoskeletal:     Comments: Right elbow -mild swelling noted.  Mild tenderness laterally.  Decreased range of motion in extension.  Right shoulder -tenderness anteriorly.  Neurological:     Mental Status: She is alert.  Psychiatric:        Mood and Affect: Mood normal.        Behavior: Behavior normal.    UC Treatments / Results  Labs (all labs ordered are listed, but only abnormal results are displayed) Labs Reviewed  NOVEL CORONAVIRUS, NAA (HOSP ORDER, SEND-OUT TO REF LAB; TAT 18-24 HRS)    EKG   Radiology DG Elbow Complete Right  Result Date: 12/04/2019 CLINICAL DATA:  Fall, hit right elbow.  Pain. EXAM: RIGHT ELBOW - COMPLETE 3+ VIEW COMPARISON:  None. FINDINGS: There is no evidence of fracture, dislocation, or joint effusion. There is no evidence of arthropathy or other focal bone  abnormality. Soft tissues are unremarkable. IMPRESSION: Negative. Electronically Signed   By: Rolm Baptise M.D.   On: 12/04/2019 15:22    Procedures Procedures (including critical care time)  Medications Ordered in UC Medications - No data to display  Initial Impression / Assessment and Plan / UC Course  I have reviewed the triage vital signs and the nursing notes.  Pertinent labs & imaging results that were available during my care of the patient were reviewed by me and considered in my medical decision making (see chart for details).    58 year old female presents with right elbow pain, trapezius muscle spasm, and low back pain following a fall.  X-ray negative.  Treating with Zanaflex and meloxicam.  Work note given as patient was tested for Covid at her request.  Final Clinical Impressions(s) / UC Diagnoses   Final diagnoses:  Right elbow pain  Trapezius muscle spasm  Acute midline low back pain without sciatica  Fall, initial encounter     Discharge Instructions     Medication as directed.  Xray was negative.  Take care  Dr. Lacinda Axon   ED Prescriptions    Medication Sig Dispense Auth. Provider   meloxicam (MOBIC) 15 MG tablet Take 1 tablet (15 mg total) by mouth daily as needed. 30 tablet Hibah Odonnell G, DO   tiZANidine (ZANAFLEX) 4 MG tablet Take 1 tablet (4 mg total) by mouth every 8 (eight) hours as needed for muscle spasms. 30 tablet Coral Spikes, DO     PDMP not reviewed this encounter.   Coral Spikes, [place] 12/04/19 1621

## 2019-12-04 NOTE — Discharge Instructions (Addendum)
Medication as directed.  Xray was negative.  Take care  Dr. Lacinda Axon

## 2019-12-04 NOTE — ED Triage Notes (Signed)
Patient states she was carrying some items up the steps on Saturday and she fell, hitting her right elbow and pulled a muscle in her right lower back. She states she didn't hit her head but she does report a headache.

## 2019-12-05 LAB — NOVEL CORONAVIRUS, NAA (HOSP ORDER, SEND-OUT TO REF LAB; TAT 18-24 HRS): SARS-CoV-2, NAA: NOT DETECTED

## 2020-03-16 ENCOUNTER — Other Ambulatory Visit: Payer: Self-pay

## 2020-03-16 ENCOUNTER — Ambulatory Visit (INDEPENDENT_AMBULATORY_CARE_PROVIDER_SITE_OTHER): Payer: BC Managed Care – PPO

## 2020-03-16 ENCOUNTER — Ambulatory Visit
Admission: EM | Admit: 2020-03-16 | Discharge: 2020-03-16 | Disposition: A | Discharge status: Home / Self Care | Payer: BC Managed Care – PPO | Attending: Family Medicine | Admitting: Family Medicine

## 2020-03-16 DIAGNOSIS — S60222A Contusion of left hand, initial encounter: Secondary | ICD-10-CM

## 2020-03-16 DIAGNOSIS — W230XXA Caught, crushed, jammed, or pinched between moving objects, initial encounter: Secondary | ICD-10-CM

## 2020-03-16 DIAGNOSIS — M79642 Pain in left hand: Secondary | ICD-10-CM

## 2020-03-16 DIAGNOSIS — M25532 Pain in left wrist: Secondary | ICD-10-CM | POA: Diagnosis not present

## 2020-03-16 DIAGNOSIS — M79641 Pain in right hand: Secondary | ICD-10-CM

## 2020-03-16 MED ORDER — NAPROXEN 500 MG PO TABS: 500.0000 mg | ORAL_TABLET | Freq: Two times a day (BID) | ORAL | 0 refills | Status: DC | PRN

## 2020-03-16 NOTE — Discharge Instructions (Signed)
Rest, Ice.  Medication as directed.  No evidence of fracture.  Take care  Dr. Lacinda Axon

## 2020-03-16 NOTE — ED Triage Notes (Signed)
Patient states that she was injured while at [facility] yesterday. States that her hands got trapped and hit against plexiglass. Patient with left wrist and hand pain and also pain in right hand.

## 2020-03-18 NOTE — ED Provider Notes (Signed)
[facility]    CSN: [id number] Arrival date & time: 03/16/20  1504      History   Chief Complaint Chief Complaint  Patient presents with  . Hand Injury   HPI  59 year old female presents with injuries to her right and left hands.  Patient reports that she was at [facility] and was checking out.  She states that the conveyor belt continued to go forward and one of her hands got trapped between Plexiglas and an item that she was purchasing.  The other hand was trapped as well.  She reports left hand and wrist pain.  She also reports right hand pain.  Patient notes that she has a knot on the dorsum of the right wrist.  She reports decreased range of motion.  Pain is 8/10 in severity.  No relieving factors.  No other reported symptoms.  No other complaints.   Past Medical History:  Diagnosis Date  . Allergic state   . Bronchitis   . COPD (chronic obstructive pulmonary disease) ([place])   . Hypertension     Patient Active Problem List   Diagnosis Date Noted  . COPD (chronic obstructive pulmonary disease) ([place]) 12/22/2018    Past Surgical History:  Procedure Laterality Date  . COLONOSCOPY WITH PROPOFOL N/A 06/28/2017   Procedure: COLONOSCOPY WITH PROPOFOL;  Surgeon: Lollie Sails, MD;  Location: [facility] ENDOSCOPY;  Service: Endoscopy;  Laterality: N/A;  . ESOPHAGOGASTRODUODENOSCOPY (EGD) WITH PROPOFOL N/A 06/28/2017   Procedure: ESOPHAGOGASTRODUODENOSCOPY (EGD) WITH PROPOFOL;  Surgeon: Lollie Sails, MD;  Location: [facility] ENDOSCOPY;  Service: Endoscopy;  Laterality: N/A;  . KNEE SURGERY Left   . OVARY SURGERY Left    partial ovary and fallopian tube removed    OB History   No obstetric history on file.      Home Medications    Prior to Admission medications   Medication Sig Start Date End Date Taking? Authorizing Provider  albuterol (PROVENTIL HFA;VENTOLIN HFA) 108 (90 BASE) MCG/ACT inhaler Inhale 1-2 puffs into the lungs every 4 (four) hours as needed for  wheezing or shortness of breath. 05/24/15  Yes Betancourt, Aura Fey, NP  aspirin 81 MG tablet Take 81 mg by mouth daily.   Yes [provider]  beclomethasone (QVAR) 80 MCG/ACT inhaler Inhale 1 puff into the lungs 2 (two) times daily. 12/24/18 04/19/22 Yes Demetrios Loll, MD  simvastatin (ZOCOR) 20 MG tablet Take by mouth. 06/30/19 06/29/20 Yes [provider]  spironolactone (ALDACTONE) 25 MG tablet Take by mouth. 12/27/18 03/16/20 Yes [provider]  naproxen (NAPROSYN) 500 MG tablet Take 1 tablet (500 mg total) by mouth 2 (two) times daily as needed for moderate pain. 03/16/20   Coral Spikes, DO  hydrochlorothiazide (HYDRODIURIL) 25 MG tablet Take 25 mg by mouth daily.  12/04/19  [provider]  potassium chloride SA (K-DUR,KLOR-CON) 20 MEQ tablet Take 20 mEq by mouth 2 (two) times daily.  12/04/19  [provider]    Family History Family History  Problem Relation Age of Onset  . Hypertension Mother   . Colon cancer Maternal Uncle   . Breast cancer Maternal Aunt        mat great aunt    Social History Social History   Tobacco Use  . Smoking status: Current Every Day Smoker    Packs/day: 1.00    Types: Cigarettes  . Smokeless tobacco: Never Used  Substance Use Topics  . Alcohol use: Not Currently    Comment: occasionally  .  Drug use: Not Currently    Types: Marijuana     Allergies   Lisinopril and Losartan   Review of Systems Review of Systems  Musculoskeletal:       Hand pain, wrist pain.   Physical Exam Triage Vital Signs ED Triage Vitals  Enc Vitals Group     BP 03/16/20 1525 (!) 144/83     Pulse Rate 03/16/20 1525 79     Resp 03/16/20 1525 16     Temp 03/16/20 1525 99.2 F (37.3 C)     Temp Source 03/16/20 1525 Oral     SpO2 03/16/20 1525 98 %     Weight 03/16/20 1523 195 lb (88.5 kg)     Height 03/16/20 1523 5\' 3"  (1.6 m)     Head Circumference --      Peak Flow --      Pain Score 03/16/20 1523 8     Pain Loc --       Pain Edu? --      Excl. in [place]? --    Updated Vital Signs BP (!) 144/83 (BP Location: Right Arm)   Pulse 79   Temp 99.2 F (37.3 C) (Oral)   Resp 16   Ht 5\' 3"  (1.6 m)   Wt 88.5 kg   LMP 02/08/2015   SpO2 98%   BMI 34.54 kg/m   Visual Acuity Right Eye Distance:   Left Eye Distance:   Bilateral Distance:    Right Eye Near:   Left Eye Near:    Bilateral Near:     Physical Exam Vitals and nursing note reviewed.  Constitutional:      General: She is not in acute distress.    Appearance: Normal appearance. She is not ill-appearing.  HENT:     Head: Normocephalic and atraumatic.  Eyes:     General:        Right eye: No discharge.        Left eye: No discharge.     Conjunctiva/sclera: Conjunctivae normal.  Pulmonary:     Effort: Pulmonary effort is normal. No respiratory distress.  Musculoskeletal:     Comments: Left hand -diffusely tender to palpation.  Left wrist -visibly tender to palpation.  Patient has a small hematoma on the dorsum of the right wrist.  Right hand -diffusely tender.    Neurological:     Mental Status: She is alert.  Psychiatric:        Mood and Affect: Mood normal.        Behavior: Behavior normal.    UC Treatments / Results  Labs (all labs ordered are listed, but only abnormal results are displayed) Labs Reviewed - No data to display  EKG   Radiology DG Wrist Complete Left  Result Date: 03/16/2020 CLINICAL DATA:  Pain EXAM: LEFT WRIST - COMPLETE 3+ VIEW COMPARISON:  None. FINDINGS: There is no evidence of fracture or dislocation. There is no evidence of arthropathy or other focal bone abnormality. Soft tissues are unremarkable. IMPRESSION: Negative. Electronically Signed   By: Constance Holster M.D.   On: 03/16/2020 16:05   DG Hand Complete Left  Result Date: 03/16/2020 CLINICAL DATA:  Pain EXAM: LEFT HAND - COMPLETE 3+ VIEW COMPARISON:  None. FINDINGS: There is no evidence of fracture or dislocation. There is no evidence of  arthropathy or other focal bone abnormality. Soft tissues are unremarkable. IMPRESSION: Negative. Electronically Signed   By: Constance Holster M.D.   On: 03/16/2020 16:06   DG Hand Complete  Right  Result Date: 03/16/2020 CLINICAL DATA:  Pain and bruising to the ulnar side of the left wrist. Pain at the fifth metacarpal. EXAM: RIGHT HAND - COMPLETE 3+ VIEW COMPARISON:  None. FINDINGS: There is no evidence of fracture or dislocation. There is no evidence of arthropathy or other focal bone abnormality. Soft tissues are unremarkable. IMPRESSION: Negative. Electronically Signed   By: Constance Holster M.D.   On: 03/16/2020 16:05    Procedures Procedures (including critical care time)  Medications Ordered in UC Medications - No data to display  Initial Impression / Assessment and Plan / UC Course  I have reviewed the triage vital signs and the nursing notes.  Pertinent labs & imaging results that were available during my care of the patient were reviewed by me and considered in my medical decision making (see chart for details).    59 year old female presents with bilateral injuries to the hands.  Soft tissue injury.  X-rays negative.  Rest, ice.  Naproxen as directed.  Supportive care.  Final Clinical Impressions(s) / UC Diagnoses   Final diagnoses:  Contusion of left hand, initial encounter  Pain of right hand  Left wrist pain     Discharge Instructions     Rest, Ice.  Medication as directed.  No evidence of fracture.  Take care  Dr. Lacinda Axon    ED Prescriptions    Medication Sig Dispense Auth. Provider   naproxen (NAPROSYN) 500 MG tablet Take 1 tablet (500 mg total) by mouth 2 (two) times daily as needed for moderate pain. 30 tablet Coral Spikes, DO     PDMP not reviewed this encounter.   Coral Spikes, [place] 03/18/20 [phone]

## 2020-06-02 ENCOUNTER — Encounter: Payer: Self-pay | Admitting: Emergency Medicine

## 2020-06-02 ENCOUNTER — Ambulatory Visit: Payer: Self-pay

## 2020-06-02 ENCOUNTER — Ambulatory Visit (INDEPENDENT_AMBULATORY_CARE_PROVIDER_SITE_OTHER): Payer: BC Managed Care – PPO

## 2020-06-02 ENCOUNTER — Ambulatory Visit
Admission: EM | Admit: 2020-06-02 | Discharge: 2020-06-02 | Disposition: A | Discharge status: Home / Self Care | Payer: BC Managed Care – PPO | Attending: Family Medicine | Admitting: Family Medicine

## 2020-06-02 ENCOUNTER — Other Ambulatory Visit: Payer: Self-pay

## 2020-06-02 DIAGNOSIS — S1093XA Contusion of unspecified part of neck, initial encounter: Secondary | ICD-10-CM

## 2020-06-02 DIAGNOSIS — S40012A Contusion of left shoulder, initial encounter: Secondary | ICD-10-CM | POA: Diagnosis not present

## 2020-06-02 DIAGNOSIS — S300XXA Contusion of lower back and pelvis, initial encounter: Secondary | ICD-10-CM

## 2020-06-02 MED ORDER — CYCLOBENZAPRINE HCL 10 MG PO TABS: 10.0000 mg | ORAL_TABLET | Freq: Every day | ORAL | 0 refills | Status: DC

## 2020-06-02 MED ORDER — HYDROCODONE-ACETAMINOPHEN 5-325 MG PO TABS: ORAL_TABLET | ORAL | 0 refills | Status: DC

## 2020-06-02 NOTE — ED Provider Notes (Signed)
[facility]    CSN: [id number] Arrival date & time: 06/02/20  1255      History   Chief Complaint Chief Complaint  Patient presents with  . Fall  . Shoulder Pain  . Back Pain    HPI Joeann Steppe is a 59 y.o. female.   59 yo female with a c/o left shoulder, neck and tailbone pain for the past 3 days since slipping and falling at home. States she slipped and landed on her tailbone but also hit her left shoulder and neck. Denies hitting her head or loss of consciousness. Denies any numbness/tingling, bowel or bladder problems, vision changes. Has taken over the counter medications without relief.    Fall  Shoulder Pain Associated symptoms: back pain   Back Pain   Past Medical History:  Diagnosis Date  . Allergic state   . Bronchitis   . COPD (chronic obstructive pulmonary disease) ([place])   . Hypertension     Patient Active Problem List   Diagnosis Date Noted  . COPD (chronic obstructive pulmonary disease) ([place]) 12/22/2018    Past Surgical History:  Procedure Laterality Date  . COLONOSCOPY WITH PROPOFOL N/A 06/28/2017   Procedure: COLONOSCOPY WITH PROPOFOL;  Surgeon: Lollie Sails, MD;  Location: [facility] ENDOSCOPY;  Service: Endoscopy;  Laterality: N/A;  . ESOPHAGOGASTRODUODENOSCOPY (EGD) WITH PROPOFOL N/A 06/28/2017   Procedure: ESOPHAGOGASTRODUODENOSCOPY (EGD) WITH PROPOFOL;  Surgeon: Lollie Sails, MD;  Location: [facility] ENDOSCOPY;  Service: Endoscopy;  Laterality: N/A;  . KNEE SURGERY Left   . OVARY SURGERY Left    partial ovary and fallopian tube removed    OB History   No obstetric history on file.      Home Medications    Prior to Admission medications   Medication Sig Start Date End Date Taking? Authorizing Provider  albuterol (PROVENTIL HFA;VENTOLIN HFA) 108 (90 BASE) MCG/ACT inhaler Inhale 1-2 puffs into the lungs every 4 (four) hours as needed for wheezing or shortness of breath. 05/24/15  Yes Betancourt, Aura Fey, NP  aspirin 81  MG tablet Take 81 mg by mouth daily.   Yes [provider]  simvastatin (ZOCOR) 20 MG tablet Take by mouth. 06/30/19 06/29/20 Yes [provider]  spironolactone (ALDACTONE) 25 MG tablet Take by mouth. 12/27/18 06/02/20 Yes [provider]  beclomethasone (QVAR) 80 MCG/ACT inhaler Inhale 1 puff into the lungs 2 (two) times daily. 12/24/18 04/19/22  Demetrios Loll, MD  cyclobenzaprine (FLEXERIL) 10 MG tablet Take 1 tablet (10 mg total) by mouth at bedtime. 06/02/20   Norval Gable, MD  HYDROcodone-acetaminophen (NORCO/VICODIN) 5-325 MG tablet 1-2 tabs po qd prn 06/02/20   Norval Gable, MD  naproxen (NAPROSYN) 500 MG tablet Take 1 tablet (500 mg total) by mouth 2 (two) times daily as needed for moderate pain. 03/16/20   Coral Spikes, DO  hydrochlorothiazide (HYDRODIURIL) 25 MG tablet Take 25 mg by mouth daily.  12/04/19  [provider]  potassium chloride SA (K-DUR,KLOR-CON) 20 MEQ tablet Take 20 mEq by mouth 2 (two) times daily.  12/04/19  [provider]    Family History Family History  Problem Relation Age of Onset  . Hypertension Mother   . Colon cancer Maternal Uncle   . Breast cancer Maternal Aunt        mat great aunt    Social History Social History   Tobacco Use  . Smoking status: Current Every Day Smoker    Packs/day: 1.00    Types: Cigarettes  . Smokeless  tobacco: Never Used  Vaping Use  . Vaping Use: Never used  Substance Use Topics  . Alcohol use: Not Currently    Comment: occasionally  . Drug use: Not Currently    Types: Marijuana     Allergies   Lisinopril and Losartan   Review of Systems Review of Systems  Musculoskeletal: Positive for back pain.     Physical Exam Triage Vital Signs ED Triage Vitals  Enc Vitals Group     BP 06/02/20 1322 137/83     Pulse Rate 06/02/20 1322 80     Resp 06/02/20 1322 18     Temp 06/02/20 1322 98.7 F (37.1 C)     Temp Source 06/02/20 1322 Oral     SpO2 06/02/20 1322 97 %      Weight 06/02/20 1320 185 lb (83.9 kg)     Height 06/02/20 1320 5\' 3"  (1.6 m)     Head Circumference --      Peak Flow --      Pain Score 06/02/20 1320 8     Pain Loc --      Pain Edu? --      Excl. in [place]? --    No data found.  Updated Vital Signs BP 137/83 (BP Location: Right Arm)   Pulse 80   Temp 98.7 F (37.1 C) (Oral)   Resp 18   Ht 5\' 3"  (1.6 m)   Wt 83.9 kg   LMP 02/08/2015   SpO2 97%   BMI 32.77 kg/m   Visual Acuity Right Eye Distance:   Left Eye Distance:   Bilateral Distance:    Right Eye Near:   Left Eye Near:    Bilateral Near:     Physical Exam Vitals and nursing note reviewed.  Constitutional:      General: She is not in acute distress.    Appearance: She is not toxic-appearing or diaphoretic.  HENT:     Head: Atraumatic.     Right Ear: Tympanic membrane normal.     Left Ear: Tympanic membrane normal.  Eyes:     Extraocular Movements: Extraocular movements intact.     Pupils: Pupils are equal, round, and reactive to light.  Cardiovascular:     Rate and Rhythm: Normal rate.     Heart sounds: Normal heart sounds.  Pulmonary:     Effort: Pulmonary effort is normal.     Breath sounds: Normal breath sounds.  Musculoskeletal:     Left shoulder: Tenderness and bony tenderness present. No swelling, deformity, effusion, laceration or crepitus. Normal range of motion. Normal strength. Normal pulse.     Cervical back: Normal range of motion. Tenderness present.     Comments: Positive tenderness to palpation over the sacrum Extremities neurovascularly intact  Neurological:     General: No focal deficit present.     Mental Status: She is alert and oriented to person, place, and time.     Gait: Gait normal.      UC Treatments / Results  Labs (all labs ordered are listed, but only abnormal results are displayed) Labs Reviewed - No data to display  EKG   Radiology DG Cervical Spine Complete  Result Date: 06/02/2020 CLINICAL DATA:  Left-sided  neck pain radiating into the left shoulder since falling 3 days ago. EXAM: CERVICAL SPINE - COMPLETE 4+ VIEW COMPARISON:  Cervical spine radiographs 06/24/2016. FINDINGS: The prevertebral soft tissues are normal. The alignment is anatomic through T1. There is no evidence of acute fracture or  traumatic subluxation. The C1-2 articulation appears normal in the AP projection. Grossly stable mild disc space narrowing and uncinate spurring at C5-6 and C6-7. Mild to moderate right osseous foraminal narrowing at C5-6. Stable chronic fragmentation of the C7 spinous process. IMPRESSION: No evidence of acute cervical spine fracture, traumatic subluxation or static signs of instability. Stable mild spondylosis at C5-6 and C6-7. Electronically Signed   By: Richardean Sale M.D.   On: 06/02/2020 14:20   DG Sacrum/Coccyx  Result Date: 06/02/2020 CLINICAL DATA:  Fall 3 days ago.  Low back and tailbone pain. EXAM: SACRUM AND COCCYX - 2+ VIEW COMPARISON:  Pelvic CT 08/07/2019. FINDINGS: The mineralization and alignment are normal. There is no evidence of acute fracture or dislocation. There are mild sacroiliac degenerative changes bilaterally. There is moderate chronic osteitis pubis. The hip joint spaces appear preserved. Postsurgical changes are present at L5-S1, and there are chronic fragmented enthesophytes of both ischial tuberosities. IMPRESSION: No acute osseous findings. Mild sacroiliac degenerative changes and chronic osteitis pubis. Electronically Signed   By: Richardean Sale M.D.   On: 06/02/2020 14:18   DG Shoulder Left  Result Date: 06/02/2020 CLINICAL DATA:  Left neck and shoulder pain since falling 3 days ago. EXAM: LEFT SHOULDER - 2+ VIEW COMPARISON:  Limited correlation made with chest radiographs and chest CTA 12/22/2018. FINDINGS: The mineralization and alignment are normal. There is no evidence of acute fracture or dislocation. There are mild acromioclavicular degenerative changes. Amorphous calcification  is noted adjacent to greater tuberosity, suspicious for calcific tendinitis/bursitis. Calcification within the left anterior chest wall is unchanged from previous CT. IMPRESSION: No acute osseous findings. Suspected calcific tendinitis/bursitis. Electronically Signed   By: Richardean Sale M.D.   On: 06/02/2020 14:23    Procedures Procedures (including critical care time)  Medications Ordered in UC Medications - No data to display  Initial Impression / Assessment and Plan / UC Course  I have reviewed the triage vital signs and the nursing notes.  Pertinent labs & imaging results that were available during my care of the patient were reviewed by me and considered in my medical decision making (see chart for details).      Final Clinical Impressions(s) / UC Diagnoses   Final diagnoses:  Contusion of sacrum, initial encounter  Contusion of neck, initial encounter  Contusion of left shoulder, initial encounter     Discharge Instructions     Rest, ice/heat, over the counter tylenol as needed    ED Prescriptions    Medication Sig Dispense Auth. Provider   HYDROcodone-acetaminophen (NORCO/VICODIN) 5-325 MG tablet 1-2 tabs po qd prn 6 tablet Norval Gable, MD   cyclobenzaprine (FLEXERIL) 10 MG tablet Take 1 tablet (10 mg total) by mouth at bedtime. 30 tablet Norval Gable, MD     1. x-ray results and diagnosis reviewed with patient 2. rx as per orders above; reviewed possible side effects, interactions, risks and benefits  3. Recommend supportive treatment as above  4. Follow-up prn if symptoms worsen or don't improve  I have reviewed the PDMP during this encounter.   Norval Gable, MD 06/02/20 1458

## 2020-06-02 NOTE — Discharge Instructions (Signed)
Rest, ice/heat, over the counter tylenol as needed

## 2020-06-02 NOTE — ED Triage Notes (Signed)
Patient states she fell on Friday night when she slipped on steps. She is c/o left shoulder pain, low back pain and tailbone pain.

## 2020-06-12 ENCOUNTER — Other Ambulatory Visit: Payer: Self-pay

## 2020-06-12 ENCOUNTER — Ambulatory Visit
Admission: EM | Admit: 2020-06-12 | Discharge: 2020-06-12 | Disposition: A | Discharge status: Home / Self Care | Payer: BC Managed Care – PPO | Attending: Family Medicine | Admitting: Family Medicine

## 2020-06-12 DIAGNOSIS — R21 Rash and other nonspecific skin eruption: Secondary | ICD-10-CM | POA: Insufficient documentation

## 2020-06-12 DIAGNOSIS — R310 Gross hematuria: Secondary | ICD-10-CM | POA: Insufficient documentation

## 2020-06-12 LAB — URINALYSIS, COMPLETE (UACMP) WITH MICROSCOPIC
Bilirubin Urine: NEGATIVE
Glucose, UA: NEGATIVE mg/dL
Leukocytes,Ua: NEGATIVE
Nitrite: NEGATIVE
Protein, ur: NEGATIVE mg/dL
Specific Gravity, Urine: >1.03 — ABNORMAL HIGH (ref 1.005–1.030)
pH: 5.5 (ref 5.0–8.0)

## 2020-06-12 MED ORDER — MUPIROCIN 2 % EX OINT: 1.0000 "application " | TOPICAL_OINTMENT | Freq: Two times a day (BID) | CUTANEOUS | 0 refills | Status: AC

## 2020-06-12 MED ORDER — VALACYCLOVIR HCL 1 G PO TABS: 1000.0000 mg | ORAL_TABLET | Freq: Three times a day (TID) | ORAL | 0 refills | Status: DC

## 2020-06-12 NOTE — ED Triage Notes (Signed)
Patient states that yesterday she started having blood in her urine and states that she thinks she may have been stung by something on her buttocks.

## 2020-06-12 NOTE — Discharge Instructions (Signed)
I am placing a referral to urology.   Medication as prescribed.  Take care  Dr. Lacinda Axon

## 2020-06-12 NOTE — ED Provider Notes (Signed)
[facility]    CSN: [id number] Arrival date & time: 06/12/20  1732      History   Chief Complaint Chief Complaint  Patient presents with  . Hematuria  . Insect Bite   HPI  59 year old female presents with the above complaints.  Patient reports that she had gross hematuria yesterday.  She reports that she had some lower abdominal cramping and pain.  Reports urinary frequency but states that she urinates frequently at her baseline.  No documented fever.  No flank pain.  She does report that she has had some back pain.  She had her daughter look at her backside and there was an area of concern.  Possible bug bite.  Patient does not recall a bug bite.  No relieving factors.  No other associated symptoms.   Past Medical History:  Diagnosis Date  . Allergic state   . Bronchitis   . COPD (chronic obstructive pulmonary disease) ([place])   . Hypertension     Patient Active Problem List   Diagnosis Date Noted  . COPD (chronic obstructive pulmonary disease) ([place]) 12/22/2018    Past Surgical History:  Procedure Laterality Date  . COLONOSCOPY WITH PROPOFOL N/A 06/28/2017   Procedure: COLONOSCOPY WITH PROPOFOL;  Surgeon: Lollie Sails, MD;  Location: [facility] ENDOSCOPY;  Service: Endoscopy;  Laterality: N/A;  . ESOPHAGOGASTRODUODENOSCOPY (EGD) WITH PROPOFOL N/A 06/28/2017   Procedure: ESOPHAGOGASTRODUODENOSCOPY (EGD) WITH PROPOFOL;  Surgeon: Lollie Sails, MD;  Location: [facility] ENDOSCOPY;  Service: Endoscopy;  Laterality: N/A;  . KNEE SURGERY Left   . OVARY SURGERY Left    partial ovary and fallopian tube removed    OB History   No obstetric history on file.      Home Medications    Prior to Admission medications   Medication Sig Start Date End Date Taking? Authorizing Provider  albuterol (PROVENTIL HFA;VENTOLIN HFA) 108 (90 BASE) MCG/ACT inhaler Inhale 1-2 puffs into the lungs every 4 (four) hours as needed for wheezing or shortness of breath. 05/24/15  Yes  Betancourt, Aura Fey, NP  aspirin 81 MG tablet Take 81 mg by mouth daily.   Yes [provider]  beclomethasone (QVAR) 80 MCG/ACT inhaler Inhale 1 puff into the lungs 2 (two) times daily. 12/24/18 04/19/22 Yes Demetrios Loll, MD  cyclobenzaprine (FLEXERIL) 10 MG tablet Take 1 tablet (10 mg total) by mouth at bedtime. 06/02/20  Yes Norval Gable, MD  HYDROcodone-acetaminophen (NORCO/VICODIN) 5-325 MG tablet 1-2 tabs po qd prn 06/02/20  Yes Conty, Linward Foster, MD  naproxen (NAPROSYN) 500 MG tablet Take 1 tablet (500 mg total) by mouth 2 (two) times daily as needed for moderate pain. 03/16/20  Yes Mitzy Naron G, DO  simvastatin (ZOCOR) 20 MG tablet Take by mouth. 06/30/19 06/29/20 Yes [provider]  mupirocin ointment (BACTROBAN) 2 % Apply 1 application topically 2 (two) times daily for 7 days. 06/12/20 06/19/20  Coral Spikes, DO  spironolactone (ALDACTONE) 25 MG tablet Take by mouth. 12/27/18 06/02/20  [provider]  valACYclovir (VALTREX) 1000 MG tablet Take 1 tablet (1,000 mg total) by mouth 3 (three) times daily. 06/12/20   Coral Spikes, DO  hydrochlorothiazide (HYDRODIURIL) 25 MG tablet Take 25 mg by mouth daily.  12/04/19  [provider]  potassium chloride SA (K-DUR,KLOR-CON) 20 MEQ tablet Take 20 mEq by mouth 2 (two) times daily.  12/04/19  [provider]    Family History Family History  Problem Relation Age of Onset  . Hypertension Mother   .  Colon cancer Maternal Uncle   . Breast cancer Maternal Aunt        mat great aunt    Social History Social History   Tobacco Use  . Smoking status: Current Every Day Smoker    Packs/day: 1.00    Types: Cigarettes  . Smokeless tobacco: Never Used  Vaping Use  . Vaping Use: Never used  Substance Use Topics  . Alcohol use: Not Currently    Comment: occasionally  . Drug use: Not Currently    Types: Marijuana     Allergies   Lisinopril and Losartan   Review of Systems Review of Systems  Genitourinary:  Positive for hematuria.  Skin: Positive for rash.   Physical Exam Triage Vital Signs ED Triage Vitals  Enc Vitals Group     BP 06/12/20 1749 113/75     Pulse Rate 06/12/20 1749 94     Resp 06/12/20 1749 18     Temp 06/12/20 1749 99.1 F (37.3 C)     Temp Source 06/12/20 1749 Oral     SpO2 06/12/20 1749 (!) 89 %     Weight 06/12/20 1747 182 lb (82.6 kg)     Height 06/12/20 1747 5' 3.5" (1.613 m)     Head Circumference --      Peak Flow --      Pain Score 06/12/20 1746 9     Pain Loc --      Pain Edu? --      Excl. in [place]? --    Updated Vital Signs BP 113/75 (BP Location: Right Arm)   Pulse 94   Temp 99.1 F (37.3 C) (Oral)   Resp 18   Ht 5' 3.5" (1.613 m)   Wt 82.6 kg   LMP 02/08/2015   SpO2 (!) 89%   BMI 31.73 kg/m   Visual Acuity Right Eye Distance:   Left Eye Distance:   Bilateral Distance:    Right Eye Near:   Left Eye Near:    Bilateral Near:     Physical Exam Vitals and nursing note reviewed.  Constitutional:      General: She is not in acute distress.    Appearance: Normal appearance. She is not ill-appearing.  HENT:     Head: Normocephalic and atraumatic.  Eyes:     General:        Right eye: No discharge.        Left eye: No discharge.     Conjunctiva/sclera: Conjunctivae normal.  Cardiovascular:     Rate and Rhythm: Normal rate and regular rhythm.  Pulmonary:     Effort: Pulmonary effort is normal.     Breath sounds: Wheezing present.  Abdominal:     General: There is no distension.     Palpations: Abdomen is soft.     Tenderness: There is no abdominal tenderness.  Skin:    Comments: Raised, vesicular rash with mild erythema noted to the left buttock.  Neurological:     Mental Status: She is alert.  Psychiatric:        Mood and Affect: Mood normal.        Behavior: Behavior normal.    UC Treatments / Results  Labs (all labs ordered are listed, but only abnormal results are displayed) Labs Reviewed  URINALYSIS, COMPLETE (UACMP)  WITH MICROSCOPIC - Abnormal; Notable for the following components:      Result Value   Specific Gravity, Urine >1.030 (*)    Hgb urine dipstick MODERATE (*)  Ketones, ur TRACE (*)    Bacteria, UA FEW (*)    All other components within normal limits  URINE CULTURE    EKG   Radiology No results found.  Procedures Procedures (including critical care time)  Medications Ordered in UC Medications - No data to display  Initial Impression / Assessment and Plan / UC Course  I have reviewed the triage vital signs and the nursing notes.  Pertinent labs & imaging results that were available during my care of the patient were reviewed by me and considered in my medical decision making (see chart for details).    59 year old female presents with 2 separate issues.  Patient experiencing gross hematuria.  Confirmed with urine microscopy today.  6-10 RBCs per high-power field.  No pyuria.  Sending culture.  Etiology and prognosis unclear at this time.  She is a smoker and this is concerning.  Sending to urology as she will need further evaluation with cystoscopy and possibly CT scan.  Referral placed.  Regarding her rash, suspect early shingles.  Placing on Valtrex.  Bactroban as well.   Final Clinical Impressions(s) / UC Diagnoses   Final diagnoses:  Gross hematuria  Rash     Discharge Instructions     I am placing a referral to urology.   Medication as prescribed.  Take care  Dr. Lacinda Axon    ED Prescriptions    Medication Sig Dispense Auth. Provider   mupirocin ointment (BACTROBAN) 2 % Apply 1 application topically 2 (two) times daily for 7 days. 22 g Phillipa Morden G, DO   valACYclovir (VALTREX) 1000 MG tablet Take 1 tablet (1,000 mg total) by mouth 3 (three) times daily. 21 tablet Thersa Salt G, DO     PDMP not reviewed this encounter.   Coral Spikes, [place] 06/12/20 1930

## 2020-06-14 LAB — URINE CULTURE: Culture: 30000 — AB

## 2020-06-16 ENCOUNTER — Other Ambulatory Visit: Payer: Self-pay | Admitting: *Deleted

## 2020-06-16 DIAGNOSIS — R310 Gross hematuria: Secondary | ICD-10-CM

## 2020-06-17 ENCOUNTER — Ambulatory Visit (INDEPENDENT_AMBULATORY_CARE_PROVIDER_SITE_OTHER): Payer: BC Managed Care – PPO | Admitting: Urology

## 2020-06-17 ENCOUNTER — Other Ambulatory Visit: Payer: Self-pay

## 2020-06-17 ENCOUNTER — Encounter: Payer: Self-pay | Admitting: Urology

## 2020-06-17 ENCOUNTER — Other Ambulatory Visit
Admission: RE | Admit: 2020-06-17 | Discharge: 2020-06-17 | Disposition: A | Discharge status: Home / Self Care | Payer: BC Managed Care – PPO | Attending: Urology | Admitting: Urology

## 2020-06-17 VITALS — BP 100/67 | HR 83 | Ht 63.5 in | Wt 194.0 lb

## 2020-06-17 DIAGNOSIS — R310 Gross hematuria: Secondary | ICD-10-CM

## 2020-06-17 LAB — URINALYSIS, COMPLETE (UACMP) WITH MICROSCOPIC
Bilirubin Urine: NEGATIVE
Glucose, UA: NEGATIVE mg/dL
Hgb urine dipstick: NEGATIVE
Ketones, ur: NEGATIVE mg/dL
Leukocytes,Ua: NEGATIVE
Nitrite: NEGATIVE
Protein, ur: NEGATIVE mg/dL
Specific Gravity, Urine: 1.02 (ref 1.005–1.030)
pH: 7 (ref 5.0–8.0)

## 2020-06-17 NOTE — Progress Notes (Signed)
   06/17/20 3:27 PM   Gloria Jones 09/05/1961 [id number]  CC: Gross hematuria  HPI: I saw [name] in urology clinic today for evaluation of gross hematuria.  She is a 59 year old female with past medical history notable for COPD, and 45-pack-year smoking history who continues to be a pack-a-day smoker who recently noted 1 day of gross hematuria.  At that time she was seen in the ER and urinalysis showed 6-10 RBCs, and urine culture was negative.  She has not had any further gross hematuria or clots. Urinalysis today is benign.  She also previously worked with dyes at a prior job.  There is no prior cross-sectional imaging to review.  She denies any history of UTIs.  She denies any family history of bladder or kidney cancer.   PMH: Past Medical History:  Diagnosis Date  . Allergic state   . Bronchitis   . COPD (chronic obstructive pulmonary disease) ([place])   . Hypertension     Surgical History: Past Surgical History:  Procedure Laterality Date  . COLONOSCOPY WITH PROPOFOL N/A 06/28/2017   Procedure: COLONOSCOPY WITH PROPOFOL;  Surgeon: Lollie Sails, MD;  Location[facility] ENDOSCOPY;  Service: Endoscopy;  Laterality: N/A;  . ESOPHAGOGASTRODUODENOSCOPY (EGD) WITH PROPOFOL N/A 06/28/2017   Procedure: ESOPHAGOGASTRODUODENOSCOPY (EGD) WITH PROPOFOL;  Surgeon: Lollie Sails, MD;  Location[facility] ENDOSCOPY;  Service: Endoscopy;  Laterality: N/A;  . KNEE SURGERY Left   . OVARY SURGERY Left    partial ovary and fallopian tube removed   Family History: Family History  Problem Relation Age of Onset  . Hypertension Mother   . Colon cancer Maternal Uncle   . Breast cancer Maternal Aunt        mat great aunt    Social History:  reports that she has been smoking cigarettes. She has been smoking about 1.00 pack per day. She has never used smokeless tobacco. She reports previous alcohol use. She reports previous drug use. Drug: Marijuana.  Physical Exam: BP 100/67   Pulse 83    Ht 5' 3.5" (1.613 m)   Wt 194 lb (88 kg)   LMP 02/08/2015   BMI 33.83 kg/m    Constitutional:  Alert and oriented, No acute distress. Cardiovascular: No clubbing, cyanosis, or edema. Respiratory: Normal respiratory effort, no increased work of breathing. GI: Abdomen is soft, nontender, nondistended, no abdominal masses  Laboratory Data: Reviewed, see HPI  Pertinent Imaging: None to review  Assessment & Plan:   In summary, she is a 59 year old female with 45-pack-year smoking history and prior exposure to dyes with one episode of gross hematuria, which was confirmed with 6-10 RBCs on urinalysis and negative urine culture.  We discussed common possible etiologies of hematuria including malignancy, urolithiasis, medical renal disease, and idiopathic. Standard workup recommended by the AUA includes imaging with CT urogram to assess the upper tracts, and cystoscopy. Cytology is performed on patient's with gross hematuria to look for malignant cells in the urine.  Follow-up for CT urogram and cystoscopy  Nickolas Madrid, MD 06/17/2020  [facility] [address] [place], [place] [address] [phone]

## 2020-06-17 NOTE — Patient Instructions (Signed)

## 2020-06-20 ENCOUNTER — Ambulatory Visit: Payer: Self-pay

## 2020-06-20 ENCOUNTER — Ambulatory Visit
Admission: EM | Admit: 2020-06-20 | Discharge: 2020-06-20 | Disposition: A | Discharge status: Home / Self Care | Payer: BC Managed Care – PPO

## 2020-06-20 ENCOUNTER — Other Ambulatory Visit: Payer: Self-pay

## 2020-06-20 DIAGNOSIS — H00022 Hordeolum internum right lower eyelid: Secondary | ICD-10-CM | POA: Diagnosis not present

## 2020-06-20 DIAGNOSIS — M79604 Pain in right leg: Secondary | ICD-10-CM

## 2020-06-20 DIAGNOSIS — M5431 Sciatica, right side: Secondary | ICD-10-CM

## 2020-06-20 MED ORDER — PREDNISONE 10 MG (21) PO TBPK
ORAL_TABLET | Freq: Every day | ORAL | 0 refills | Status: AC
Start: 2020-06-20 — End: 2020-06-26

## 2020-06-20 MED ORDER — HYDROCODONE-ACETAMINOPHEN 5-325 MG PO TABS: 1.0000 | ORAL_TABLET | ORAL | 0 refills | Status: DC | PRN

## 2020-06-20 NOTE — ED Provider Notes (Signed)
[place]   [id number] 06/20/20 Arrival Time: 1412  HA:LPFXT PAIN  SUBJECTIVE: History from: patient. Gloria Jones is a 58 y.o. female complains of lower back pain, right leg pain, stye to the right lower eyelid.  Reports that she has been taking Flexeril that was prescribed here at this office as well as naproxen.  Reports that she had a fall and was x-rayed and seen here, everything was negative.  Has not seen primary care orthopedics since the visit here.  Reports that shingles rash has resolved.  Reports that her pain is constant and is achy.  Reports that is keeping her up at night.  Symptoms are made worse with activity, and not alleviated by much.Denies fever, chills, erythema, ecchymosis, effusion, weakness, numbness and tingling, saddle paresthesias, loss of bowel or bladder function.      ROS: As per HPI.  All other pertinent ROS negative.     Past Medical History:  Diagnosis Date  . Allergic state   . Bronchitis   . COPD (chronic obstructive pulmonary disease) ([place])   . Hypertension    Past Surgical History:  Procedure Laterality Date  . COLONOSCOPY WITH PROPOFOL N/A 06/28/2017   Procedure: COLONOSCOPY WITH PROPOFOL;  Surgeon: Lollie Sails, MD;  Location: [facility] ENDOSCOPY;  Service: Endoscopy;  Laterality: N/A;  . ESOPHAGOGASTRODUODENOSCOPY (EGD) WITH PROPOFOL N/A 06/28/2017   Procedure: ESOPHAGOGASTRODUODENOSCOPY (EGD) WITH PROPOFOL;  Surgeon: Lollie Sails, MD;  Location: [facility] ENDOSCOPY;  Service: Endoscopy;  Laterality: N/A;  . KNEE SURGERY Left   . OVARY SURGERY Left    partial ovary and fallopian tube removed   Allergies  Allergen Reactions  . Lisinopril     Other reaction(s): Cough  . Losartan     Other reaction(s): Headache   No current facility-administered medications on file prior to encounter.   Current Outpatient Medications on File Prior to Encounter  Medication Sig Dispense Refill  . valACYclovir (VALTREX) 1000 MG tablet Take  1,000 mg by mouth 2 (two) times daily.    Marland Kitchen albuterol (PROVENTIL HFA;VENTOLIN HFA) 108 (90 BASE) MCG/ACT inhaler Inhale 1-2 puffs into the lungs every 4 (four) hours as needed for wheezing or shortness of breath. 1 Inhaler 0  . beclomethasone (QVAR) 80 MCG/ACT inhaler Inhale 1 puff into the lungs 2 (two) times daily. 1 Inhaler 2  . cyclobenzaprine (FLEXERIL) 10 MG tablet Take 1 tablet (10 mg total) by mouth at bedtime. 30 tablet 0  . naproxen (NAPROSYN) 500 MG tablet Take 1 tablet (500 mg total) by mouth 2 (two) times daily as needed for moderate pain. 30 tablet 0  . spironolactone (ALDACTONE) 25 MG tablet Take by mouth.     Social History   Socioeconomic History  . Marital status: Married    Spouse name: Not on file  . Number of children: Not on file  . Years of education: Not on file  . Highest education level: Not on file  Occupational History  . Not on file  Tobacco Use  . Smoking status: Current Every Day Smoker    Packs/day: 1.00    Types: Cigarettes  . Smokeless tobacco: Never Used  Vaping Use  . Vaping Use: Never used  Substance and Sexual Activity  . Alcohol use: Not Currently    Comment: occasionally  . Drug use: Not Currently    Types: Marijuana  . Sexual activity: Not on file  Other Topics Concern  . Not on file  Social History Narrative  . Not on file  Social Determinants of Health   Financial Resource Strain:   . Difficulty of Paying Living Expenses:   Food Insecurity:   . Worried About Charity fundraiser in the Last Year:   . Arboriculturist in the Last Year:   Transportation Needs:   . Film/video editor (Medical):   Marland Kitchen Lack of Transportation (Non-Medical):   Physical Activity:   . Days of Exercise per Week:   . Minutes of Exercise per Session:   Stress:   . Feeling of Stress :   Social Connections:   . Frequency of Communication with Friends and Family:   . Frequency of Social Gatherings with Friends and Family:   . Attends Religious  Services:   . Active Member of Clubs or Organizations:   . Attends Archivist Meetings:   Marland Kitchen Marital Status:   Intimate Partner Violence:   . Fear of Current or Ex-Partner:   . Emotionally Abused:   Marland Kitchen Physically Abused:   . Sexually Abused:    Family History  Problem Relation Age of Onset  . Hypertension Mother   . Colon cancer Maternal Uncle   . Breast cancer Maternal Aunt        mat great aunt    OBJECTIVE:  Vitals:   06/20/20 1425 06/20/20 1426  BP: 130/78   Pulse: 76   Resp: 16   Temp: 98.2 F (36.8 C)   TempSrc: Oral   SpO2: 92%   Weight:  193 lb 12.6 oz (87.9 kg)  Height:  5' 3.5" (1.613 m)    General appearance: ALERT; in no acute distress.  Head: NCAT EYES: Internal hordeolum present to right eye Lungs: Normal respiratory effort CV: pedal pulses 2+ bilaterally. Cap refill < 2 seconds Musculoskeletal:  Inspection: Skin warm, dry, clear and intact without obvious erythema, effusion, or ecchymosis.  Palpation: Low back tender to palpation ROM: FROM active and passive Skin: warm and dry Neurologic: Ambulates without difficulty; Sensation intact about the upper/ lower extremities Psychological: alert and cooperative; normal mood and affect  DIAGNOSTIC STUDIES:  No results found.   ASSESSMENT & PLAN:  1. Pain of right lower extremity   2. Sciatica of right side   3. Hordeolum internum of right lower eyelid      Continue conservative management of rest, ice, and gentle stretches Take naproxen as needed for pain relief (may cause abdominal discomfort, ulcers, and GI bleeds avoid taking with other NSAIDs) Take cyclobenzaprine at nighttime for symptomatic relief. Avoid driving or operating heavy machinery while using medication. Norco prescribed Follow up with orthopedics Follow up with PCP if symptoms persist Return or go to the ER if you have any new or worsening symptoms (fever, chills, chest pain, abdominal pain, changes in bowel or bladder  habits, pain radiating into lower legs)   [place] Controlled Substances Registry consulted for this patient. I feel the risk/benefit ratio today is favorable for proceeding with this prescription for a controlled substance. Medication sedation precautions given.  Reviewed expectations re: course of current medical issues. Questions answered. Outlined signs and symptoms indicating need for more acute intervention. Patient verbalized understanding. After Visit Summary given.       Faustino Congress, NP 06/20/20 1457

## 2020-06-20 NOTE — Discharge Instructions (Addendum)
Treated for sciatica with prednisone to help bring down the inflammation  I have sent in a prednisone taper for you to take for 6 days. 6 tablets on day one, 5 tablets on day two, 4 tablets on day three, 3 tablets on day four, 2 tablets on day five, and 1 tablet on day six.  Also sent in a few more Norco for you to take  Get some stye ointment over-the-counter at your pharmacy and use this daily at night.  It may take about a week for you to fully resolve  Follow-up with orthopedics if this is not helping  Follow-up with primary care as needed  Follow-up with the ER for loss of sensation, unrelenting pain, trouble swallowing, trouble breathing, other concerning symptoms

## 2020-06-20 NOTE — ED Triage Notes (Signed)
Pt states she had a fall on 6/16 and is now having right leg pain. Also states she was recently diagnosed here with shingles and now has a stye under her right eye.

## 2020-07-01 ENCOUNTER — Other Ambulatory Visit: Payer: Self-pay

## 2020-07-01 DIAGNOSIS — R310 Gross hematuria: Secondary | ICD-10-CM

## 2020-07-04 ENCOUNTER — Ambulatory Visit: Admission: RE | Admit: 2020-07-04 | Payer: BC Managed Care – PPO | Source: Ambulatory Visit

## 2020-07-04 ENCOUNTER — Other Ambulatory Visit: Payer: Self-pay

## 2020-07-04 ENCOUNTER — Ambulatory Visit
Admission: RE | Admit: 2020-07-04 | Discharge: 2020-07-04 | Disposition: A | Discharge status: Home / Self Care | Payer: BC Managed Care – PPO | Source: Ambulatory Visit | Attending: Urology | Admitting: Urology

## 2020-07-04 DIAGNOSIS — R310 Gross hematuria: Secondary | ICD-10-CM | POA: Insufficient documentation

## 2020-07-04 HISTORY — DX: Unspecified asthma, uncomplicated: J45.909

## 2020-07-04 LAB — POCT I-STAT CREATININE: Creatinine, Ser: 0.6 mg/dL (ref 0.44–1.00)

## 2020-07-04 MED ORDER — IOHEXOL 300 MG/ML  SOLN
125.0000 mL | Freq: Once | INTRAMUSCULAR | Status: AC | PRN
  Administered 2020-07-04: 125 mL via INTRAVENOUS

## 2020-07-07 ENCOUNTER — Other Ambulatory Visit: Payer: Self-pay | Admitting: *Deleted

## 2020-07-07 DIAGNOSIS — R310 Gross hematuria: Secondary | ICD-10-CM

## 2020-07-08 ENCOUNTER — Other Ambulatory Visit
Admission: RE | Admit: 2020-07-08 | Discharge: 2020-07-08 | Disposition: A | Discharge status: Home / Self Care | Payer: BC Managed Care – PPO | Source: Ambulatory Visit | Attending: Urology | Admitting: Urology

## 2020-07-08 ENCOUNTER — Ambulatory Visit (INDEPENDENT_AMBULATORY_CARE_PROVIDER_SITE_OTHER): Payer: BC Managed Care – PPO | Admitting: Urology

## 2020-07-08 ENCOUNTER — Other Ambulatory Visit: Payer: Self-pay

## 2020-07-08 ENCOUNTER — Other Ambulatory Visit
Admission: RE | Admit: 2020-07-08 | Discharge: 2020-07-08 | Disposition: A | Discharge status: Home / Self Care | Payer: BC Managed Care – PPO | Attending: Urology | Admitting: Urology

## 2020-07-08 ENCOUNTER — Encounter: Payer: Self-pay | Admitting: Urology

## 2020-07-08 VITALS — BP 132/86 | HR 71 | Ht 63.0 in | Wt 188.0 lb

## 2020-07-08 DIAGNOSIS — N200 Calculus of kidney: Secondary | ICD-10-CM | POA: Diagnosis not present

## 2020-07-08 DIAGNOSIS — R310 Gross hematuria: Secondary | ICD-10-CM | POA: Insufficient documentation

## 2020-07-08 LAB — URINALYSIS, COMPLETE (UACMP) WITH MICROSCOPIC
Bilirubin Urine: NEGATIVE
Glucose, UA: NEGATIVE mg/dL
Hgb urine dipstick: NEGATIVE
Ketones, ur: NEGATIVE mg/dL
Leukocytes,Ua: NEGATIVE
Nitrite: NEGATIVE
Protein, ur: 100 mg/dL — AB
RBC / HPF: NONE SEEN RBC/hpf (ref 0–5)
Specific Gravity, Urine: 1.025 (ref 1.005–1.030)
pH: 5.5 (ref 5.0–8.0)

## 2020-07-08 NOTE — Progress Notes (Signed)
Cystoscopy Procedure Note:  Indication: Gross hematuria  After informed consent and discussion of the procedure and its risks, Gloria Jones was positioned and prepped in the standard fashion. Cystoscopy was performed with a flexible cystoscope. The urethra, bladder neck and entire bladder was visualized in a standard fashion. The ureteral orifices were visualized in their normal location and orientation.  No abnormalities on retroflexion.  Urine cytology sent  Imaging: I personally reviewed the CT urogram.  There is a 1.3 cm right lower pole stone that is stable for over 1 year.  No hydronephrosis or masses.  Findings: Normal cystoscopy -------------------------------------------------------------  Assessment and Plan: We reviewed her CT results at length regarding the 1.3 cm nonobstructing right lower pole stone.  This is unchanged from CT over a year ago.  She is asymptomatic.  We discussed options including ureteroscopy, shockwave lithotripsy, or observation, as well as the risks of benefits.  She opted for observation with repeat KUB in 1 year which is very reasonable.  We discussed return precautions including worsening gross hematuria, or right-sided renal colic.  We will call with cytology results.  I spent 20 total minutes on the day of the encounter including pre-visit review of the medical record, face-to-face time with the patient, and post visit ordering of labs/imaging/tests.   Nickolas Madrid, MD 07/08/2020

## 2020-07-08 NOTE — Addendum Note (Signed)
Addended by: Despina Hidden on: 07/08/2020 10:54 AM   Modules accepted: Orders

## 2020-07-10 LAB — CYTOLOGY - NON PAP

## 2020-07-11 ENCOUNTER — Telehealth: Payer: Self-pay

## 2020-07-11 NOTE — Telephone Encounter (Signed)
-----   Message from Billey Co, MD sent at 07/10/2020  6:33 PM EDT ----- Doristine Devoid news, no cancer cells on urine sample, keep follow up as scheduled  Nickolas Madrid, MD 07/10/2020

## 2020-07-11 NOTE — Telephone Encounter (Signed)
Thanks- its a team effort for sure and we have so many patient's that are so grateful for your attitude and kindness  Always appreciate ya!!  Aaron Edelman

## 2020-07-11 NOTE — Telephone Encounter (Signed)
Called pt informed her of the information below. Pt gave verbal understanding.  

## 2020-11-27 ENCOUNTER — Other Ambulatory Visit: Payer: Self-pay

## 2020-11-27 ENCOUNTER — Ambulatory Visit (INDEPENDENT_AMBULATORY_CARE_PROVIDER_SITE_OTHER): Payer: BC Managed Care – PPO

## 2020-11-27 ENCOUNTER — Ambulatory Visit
Admission: RE | Admit: 2020-11-27 | Discharge: 2020-11-27 | Disposition: A | Discharge status: Home / Self Care | Payer: BC Managed Care – PPO | Source: Ambulatory Visit | Attending: Family Medicine | Admitting: Family Medicine

## 2020-11-27 VITALS — BP 127/98 | HR 78 | Temp 98.2°F | Resp 18 | Ht 63.0 in | Wt 186.0 lb

## 2020-11-27 DIAGNOSIS — Z7952 Long term (current) use of systemic steroids: Secondary | ICD-10-CM | POA: Diagnosis not present

## 2020-11-27 DIAGNOSIS — T148XXA Other injury of unspecified body region, initial encounter: Secondary | ICD-10-CM

## 2020-11-27 DIAGNOSIS — R0981 Nasal congestion: Secondary | ICD-10-CM | POA: Diagnosis not present

## 2020-11-27 DIAGNOSIS — F17210 Nicotine dependence, cigarettes, uncomplicated: Secondary | ICD-10-CM | POA: Insufficient documentation

## 2020-11-27 DIAGNOSIS — R519 Headache, unspecified: Secondary | ICD-10-CM | POA: Insufficient documentation

## 2020-11-27 DIAGNOSIS — Z79899 Other long term (current) drug therapy: Secondary | ICD-10-CM | POA: Insufficient documentation

## 2020-11-27 DIAGNOSIS — Z888 Allergy status to other drugs, medicaments and biological substances status: Secondary | ICD-10-CM | POA: Diagnosis not present

## 2020-11-27 DIAGNOSIS — J209 Acute bronchitis, unspecified: Secondary | ICD-10-CM | POA: Diagnosis present

## 2020-11-27 DIAGNOSIS — J029 Acute pharyngitis, unspecified: Secondary | ICD-10-CM | POA: Diagnosis not present

## 2020-11-27 DIAGNOSIS — Z20822 Contact with and (suspected) exposure to covid-19: Secondary | ICD-10-CM | POA: Diagnosis not present

## 2020-11-27 DIAGNOSIS — R21 Rash and other nonspecific skin eruption: Secondary | ICD-10-CM | POA: Insufficient documentation

## 2020-11-27 DIAGNOSIS — Z7951 Long term (current) use of inhaled steroids: Secondary | ICD-10-CM | POA: Diagnosis not present

## 2020-11-27 LAB — RESP PANEL BY RT-PCR (FLU A&B, COVID) ARPGX2
Influenza A by PCR: NEGATIVE
Influenza B by PCR: NEGATIVE
SARS Coronavirus 2 by RT PCR: NEGATIVE

## 2020-11-27 MED ORDER — BENZONATATE 100 MG PO CAPS
200.0000 mg | ORAL_CAPSULE | Freq: Three times a day (TID) | ORAL | 0 refills | Status: DC
Start: 2020-11-27 — End: 2021-07-07

## 2020-11-27 MED ORDER — PROMETHAZINE-DM 6.25-15 MG/5ML PO SYRP
5.0000 mL | ORAL_SOLUTION | Freq: Four times a day (QID) | ORAL | 0 refills | Status: DC | PRN
Start: 2020-11-27 — End: 2021-07-07

## 2020-11-27 MED ORDER — PREDNISONE 10 MG (21) PO TBPK: ORAL_TABLET | Freq: Every day | ORAL | 0 refills | Status: DC

## 2020-11-27 MED ORDER — TRIAMCINOLONE ACETONIDE 0.1 % EX CREA
1.0000 | TOPICAL_CREAM | Freq: Two times a day (BID) | CUTANEOUS | 0 refills | Status: DC
Start: 2020-11-27 — End: 2021-07-07

## 2020-11-27 NOTE — ED Triage Notes (Signed)
Pt reports being exposed to covid on Friday. sts she is having a headache, nasal congestion, and sore throat  Also reports having a rash on R side of neck. Rash appeared 3 weeks ago.

## 2020-11-27 NOTE — Discharge Instructions (Addendum)
Your test today was negative for flu and Covid but your chest x-ray showed bronchitis.  Continue to use your albuterol inhaler as needed for wheezing, and continue your Qvar daily.  Take the prednisone according to the package instructions.  Take the [facility] every 8 hours as needed for cough.  Take them with a small sip of water.  Use the Promethazine DM for cough and sleep.  Plan the triamcinolone ointment to the abraded skin on the left side of your neck twice a day.  If your symptoms continue or worsen follow-up with your primary care provider

## 2020-11-27 NOTE — ED Provider Notes (Signed)
[facility]    CSN: [id number] Arrival date & time: 11/27/20  1359      History   Chief Complaint Chief Complaint  Patient presents with  . Appointment  . Headache    HPI Gloria Jones is a 59 y.o. female.   HPI   59 year old female here for evaluation of headache, nasal congestion, and sore throat.  Patient reports that she was exposed to Covid 6 days ago.  She reports that she has been having the symptoms for the past 2 to 3 days.  She also has had associated body aches, productive cough, and wheezing.  Patient denies fever, sore throat, ear pain or pressure, changes to her sense of taste or smell, shortness of breath, nausea, vomiting, diarrhea or sick contacts other than the Covid exposure.  Patient states she had Covid in the past and has not received her vaccine patient is also not had a flu vaccine.  And also wants to have a rash on her right side of her neck evaluated that she is had for the past 3 weeks.  The rash is itchy.  Past Medical History:  Diagnosis Date  . Allergic state   . Asthma   . Bronchitis   . COPD (chronic obstructive pulmonary disease) ([place])   . Hypertension     Patient Active Problem List   Diagnosis Date Noted  . COPD (chronic obstructive pulmonary disease) ([place]) 12/22/2018    Past Surgical History:  Procedure Laterality Date  . COLONOSCOPY WITH PROPOFOL N/A 06/28/2017   Procedure: COLONOSCOPY WITH PROPOFOL;  Surgeon: Lollie Sails, MD;  Location: [facility] ENDOSCOPY;  Service: Endoscopy;  Laterality: N/A;  . ESOPHAGOGASTRODUODENOSCOPY (EGD) WITH PROPOFOL N/A 06/28/2017   Procedure: ESOPHAGOGASTRODUODENOSCOPY (EGD) WITH PROPOFOL;  Surgeon: Lollie Sails, MD;  Location: [facility] ENDOSCOPY;  Service: Endoscopy;  Laterality: N/A;  . KNEE SURGERY Left   . OVARY SURGERY Left    partial ovary and fallopian tube removed    OB History   No obstetric history on file.      Home Medications    Prior to Admission medications    Medication Sig Start Date End Date Taking? Authorizing Provider  albuterol (PROVENTIL HFA;VENTOLIN HFA) 108 (90 BASE) MCG/ACT inhaler Inhale 1-2 puffs into the lungs every 4 (four) hours as needed for wheezing or shortness of breath. 05/24/15   Betancourt, Aura Fey, NP  beclomethasone (QVAR) 80 MCG/ACT inhaler Inhale 1 puff into the lungs 2 (two) times daily. 12/24/18 04/19/22  Demetrios Loll, MD  benzonatate (TESSALON) 100 MG capsule Take 2 capsules (200 mg total) by mouth every 8 (eight) hours. 11/27/20   Margarette Canada, NP  cyclobenzaprine (FLEXERIL) 10 MG tablet Take 1 tablet (10 mg total) by mouth at bedtime. 06/02/20   Norval Gable, MD  HYDROcodone-acetaminophen (NORCO/VICODIN) 5-325 MG tablet Take 1 tablet by mouth every 4 (four) hours as needed for moderate pain or severe pain. 06/20/20   Faustino Congress, NP  predniSONE (STERAPRED UNI-PAK 21 TAB) 10 MG (21) TBPK tablet Take by mouth daily. Take 6 tabs by mouth daily  for 2 days, then 5 tabs for 2 days, then 4 tabs for 2 days, then 3 tabs for 2 days, 2 tabs for 2 days, then 1 tab by mouth daily for 2 days 11/27/20   Margarette Canada, NP  promethazine-dextromethorphan (PROMETHAZINE-DM) 6.25-15 MG/5ML syrup Take 5 mLs by mouth 4 (four) times daily as needed. 11/27/20   Margarette Canada, NP  spironolactone (ALDACTONE) 25 MG tablet Take by mouth.  12/27/18   [provider]    Family History Family History  Problem Relation Age of Onset  . Hypertension Mother   . Colon cancer Maternal Uncle   . Breast cancer Maternal Aunt        mat great aunt    Social History Social History   Tobacco Use  . Smoking status: Current Every Day Smoker    Packs/day: 1.00    Types: Cigarettes  . Smokeless tobacco: Never Used  Vaping Use  . Vaping Use: Never used  Substance Use Topics  . Alcohol use: Not Currently    Comment: occasionally  . Drug use: Not Currently    Types: Marijuana     Allergies   Lisinopril, Losartan, and Pravastatin   Review of  Systems Review of Systems  Constitutional: Negative for activity change, appetite change and fever.  HENT: Positive for congestion and sore throat. Negative for ear pain.   Respiratory: Positive for cough and wheezing. Negative for shortness of breath.   Gastrointestinal: Negative for abdominal pain, diarrhea, nausea and vomiting.  Musculoskeletal: Positive for arthralgias and myalgias.  Skin: Positive for rash.  Hematological: Negative.   Psychiatric/Behavioral: Negative.      Physical Exam Triage Vital Signs ED Triage Vitals  Enc Vitals Group     BP 11/27/20 1411 (!) 127/98     Pulse Rate 11/27/20 1411 78     Resp 11/27/20 1411 18     Temp 11/27/20 1411 98.2 F (36.8 C)     Temp Source 11/27/20 1411 Oral     SpO2 11/27/20 1411 93 %     Weight 11/27/20 1413 186 lb (84.4 kg)     Height 11/27/20 1413 5\' 3"  (1.6 m)     Head Circumference --      Peak Flow --      Pain Score 11/27/20 1412 8     Pain Loc --      Pain Edu? --      Excl. in [place]? --    No data found.  Updated Vital Signs BP (!) 127/98   Pulse 78   Temp 98.2 F (36.8 C) (Oral)   Resp 18   Ht 5\' 3"  (1.6 m)   Wt 186 lb (84.4 kg)   LMP 02/08/2015   SpO2 93%   BMI 32.95 kg/m   Visual Acuity Right Eye Distance:   Left Eye Distance:   Bilateral Distance:    Right Eye Near:   Left Eye Near:    Bilateral Near:     Physical Exam Vitals and nursing note reviewed.  Constitutional:      General: She is not in acute distress.    Appearance: She is well-developed and normal weight. She is not toxic-appearing.  HENT:     Head: Normocephalic and atraumatic.     Comments: Bilateral TMs are pearly gray with a normal light reflex.  EACs unremarkable.  Nasal mucosa is mildly erythematous and edematous with some clear nasal discharge.    Mouth/Throat:     Mouth: Mucous membranes are moist.     Pharynx: Oropharynx is clear.     Comments: Posterior oropharynx has mild erythema and clear postnasal drip. Eyes:      Extraocular Movements: Extraocular movements intact.     Right eye: No nystagmus.     Left eye: No nystagmus.     Pupils: Pupils are equal, round, and reactive to light.  Cardiovascular:     Rate and Rhythm: Normal rate and  regular rhythm.     Heart sounds: Normal heart sounds. No murmur heard. No gallop.   Pulmonary:     Effort: Pulmonary effort is normal.     Breath sounds: Wheezing present. No rhonchi or rales.     Comments: Patient has decreased lung sounds and wheezes in all fields. Musculoskeletal:        General: No swelling or tenderness. Normal range of motion.     Cervical back: Normal range of motion and neck supple.  Lymphadenopathy:     Cervical: No cervical adenopathy.  Skin:    Capillary Refill: Capillary refill takes less than 2 seconds.     Findings: Rash present.     Comments: Patient has a dry, erythematous rash on the right side of her neck.  The rash is not scaly.  Neurological:     Mental Status: She is alert and oriented to person, place, and time.     GCS: GCS eye subscore is 4. GCS verbal subscore is 5. GCS motor subscore is 6.     Cranial Nerves: No cranial nerve deficit.  Psychiatric:        Mood and Affect: Mood normal.        Speech: Speech normal.        Behavior: Behavior normal.      UC Treatments / Results  Labs (all labs ordered are listed, but only abnormal results are displayed) Labs Reviewed  RESP PANEL BY RT-PCR (FLU A&B, COVID) [id number]    EKG   Radiology DG Chest 2 View  Result Date: 11/27/2020 CLINICAL DATA:  Headache. Nasal congestion. Recent exposure to coronavirus. EXAM: CHEST - 2 VIEW COMPARISON:  12/22/2018 FINDINGS: Heart size upper limits of normal. Chronic aortic atherosclerosis. Bronchial thickening suggesting bronchitis. No infiltrate, collapse or effusion. No acute bone finding. IMPRESSION: Possible bronchitis. No consolidation or collapse. Electronically Signed   By: Nelson Chimes M.D.   On: 11/27/2020 15:04     Procedures Procedures (including critical care time)  Medications Ordered in UC Medications - No data to display  Initial Impression / Assessment and Plan / UC Course  I have reviewed the triage vital signs and the nursing notes.  Pertinent labs & imaging results that were available during my care of the patient were reviewed by me and considered in my medical decision making (see chart for details).   For evaluation of cold symptoms after being exposed to Covid 6 days ago.  Patient has not had a fever or changes to her sense of taste or smell.  Patient has mild URI symptoms.  [facility] respiratory panel collected at triage.  Patient does have diffuse wheezing and decreased lung sounds will obtain chest x-ray as well.  Respiratory panel is negative for Covid or flu.  Chest x-ray is being read as bronchitis.  We will discharge patient home with Tessalon Perles, Promethazine DM, and prednisone.  We will have her continue her Qvar and albuterol.   Final Clinical Impressions(s) / UC Diagnoses   Final diagnoses:  Acute bronchitis, unspecified organism     Discharge Instructions     Your test today was negative for flu and Covid but your chest x-ray showed bronchitis.  Continue to use your albuterol inhaler as needed for wheezing, and continue your Qvar daily.  Take the prednisone according to the package instructions.  Take the [facility] every 8 hours as needed for cough.  Take them with a small sip of water.  Use the Promethazine DM for cough  and sleep.  If your symptoms continue or worsen follow-up with your primary care provider    ED Prescriptions    Medication Sig Dispense Auth. Provider   benzonatate (TESSALON) 100 MG capsule Take 2 capsules (200 mg total) by mouth every 8 (eight) hours. 21 capsule Margarette Canada, NP   predniSONE (STERAPRED UNI-PAK 21 TAB) 10 MG (21) TBPK tablet Take by mouth daily. Take 6 tabs by mouth daily  for 2 days, then 5 tabs for 2 days, then  4 tabs for 2 days, then 3 tabs for 2 days, 2 tabs for 2 days, then 1 tab by mouth daily for 2 days 42 tablet Margarette Canada, NP   promethazine-dextromethorphan (PROMETHAZINE-DM) 6.25-15 MG/5ML syrup Take 5 mLs by mouth 4 (four) times daily as needed. 118 mL Margarette Canada, NP     PDMP not reviewed this encounter.   Margarette Canada, NP 11/27/20 1525

## 2020-12-07 ENCOUNTER — Ambulatory Visit: Payer: Self-pay

## 2020-12-18 ENCOUNTER — Ambulatory Visit: Payer: Self-pay

## 2020-12-18 ENCOUNTER — Other Ambulatory Visit: Payer: Self-pay

## 2020-12-18 ENCOUNTER — Ambulatory Visit
Admission: EM | Admit: 2020-12-18 | Discharge: 2020-12-18 | Disposition: A | Discharge status: Home / Self Care | Payer: BC Managed Care – PPO | Attending: Physician Assistant | Admitting: Physician Assistant

## 2020-12-18 DIAGNOSIS — M545 Low back pain, unspecified: Secondary | ICD-10-CM | POA: Diagnosis present

## 2020-12-18 DIAGNOSIS — Z20822 Contact with and (suspected) exposure to covid-19: Secondary | ICD-10-CM

## 2020-12-18 DIAGNOSIS — J441 Chronic obstructive pulmonary disease with (acute) exacerbation: Secondary | ICD-10-CM | POA: Diagnosis present

## 2020-12-18 DIAGNOSIS — R059 Cough, unspecified: Secondary | ICD-10-CM | POA: Insufficient documentation

## 2020-12-18 LAB — RESP PANEL BY RT-PCR (FLU A&B, COVID) ARPGX2
Influenza A by PCR: NEGATIVE
Influenza B by PCR: NEGATIVE
SARS Coronavirus 2 by RT PCR: POSITIVE — AB

## 2020-12-18 MED ORDER — AZITHROMYCIN 250 MG PO TABS
250.0000 mg | ORAL_TABLET | Freq: Every day | ORAL | 0 refills | Status: DC
Start: 2020-12-18 — End: 2021-07-07

## 2020-12-18 MED ORDER — PREDNISONE 20 MG PO TABS
40.0000 mg | ORAL_TABLET | Freq: Every day | ORAL | 0 refills | Status: AC
Start: 2020-12-18 — End: 2020-12-23

## 2020-12-18 NOTE — ED Triage Notes (Addendum)
Patient presents to Urgent Care with complaints of back pain, fatigue, headache x 2 days. Patient reports positive Covid exposure 12/25. Treating pain OTC tylenol last dose 2 hrs ago, lidocaine patch on lower back, and Tessalon and promethazine for cough. Has a hx of asthma and COPD reports her 02 baseline is 93%.  Denies fever, N/V, diarrhea.

## 2020-12-18 NOTE — Discharge Instructions (Addendum)
Treating her COPD exacerbation with azithromycin and prednisone.  Continue her inhalers and breathing treatments as needed.  Take your Tessalon Perles and Promethazine DM as needed for cough.  Increase your rest and fluid intake.  I will call you with the results of your Covid test today.  See more information below.  You have received COVID testing today either for positive exposure, concerning symptoms that could be related to COVID infection, screening purposes, or re-testing after confirmed positive.  Your test obtained today checks for active viral infection in the last 1-2 weeks. If your test is negative now, you can still test positive later. So, if you do develop symptoms you should either get re-tested and/or isolate x 10 days. Please follow CDC guidelines.  While Rapid antigen tests come back in 15-20 minutes, send out PCR/molecular test results typically come back within 24 hours. In the mean time, if you are symptomatic, assume this could be a positive test and treat/monitor yourself as if you do have COVID.   We will call with test results. Please download the MyChart app and set up a profile to access test results.   If symptomatic, go home and rest. Push fluids. Take Tylenol as needed for discomfort. Gargle warm salt water. Throat lozenges. Take Mucinex DM or Robitussin for cough. Humidifier in bedroom to ease coughing. Warm showers. Also review the COVID handout for more information.  COVID-19 INFECTION: The incubation period of COVID-19 is approximately 14 days after exposure, with most symptoms developing in roughly 4-5 days. Symptoms may range in severity from mild to critically severe. Roughly 80% of those infected will have mild symptoms. People of any age may become infected with COVID-19 and have the ability to transmit the virus. The most common symptoms include: fever, fatigue, cough, body aches, headaches, sore throat, nasal congestion, shortness of breath, nausea, vomiting,  diarrhea, changes in smell and/or taste.    COURSE OF ILLNESS Some patients may begin with mild disease which can progress quickly into critical symptoms. If your symptoms are worsening please call ahead to the Emergency Department and proceed there for further treatment. Recovery time appears to be roughly 1-2 weeks for mild symptoms and 3-6 weeks for severe disease.   GO IMMEDIATELY TO ER FOR FEVER YOU ARE UNABLE TO GET DOWN WITH TYLENOL, BREATHING PROBLEMS, CHEST PAIN, FATIGUE, LETHARGY, INABILITY TO EAT OR DRINK, ETC  QUARANTINE AND ISOLATION: To help decrease the spread of COVID-19 please remain isolated if you have COVID infection or are highly suspected to have COVID infection. This means -stay home and isolate to one room in the home if you live with others. Do not share a bed or bathroom with others while ill, sanitize and wipe down all countertops and keep common areas clean and disinfected. You may discontinue isolation if you have a mild case and are asymptomatic 10 days after symptom onset as long as you have been fever free >24 hours without having to take Motrin or Tylenol. If your case is more severe (meaning you develop pneumonia or are admitted in the hospital), you may have to isolate longer.   If you have been in close contact (within 6 feet) of someone diagnosed with COVID 19, you are advised to quarantine in your home for 14 days as symptoms can develop anywhere from 2-14 days after exposure to the virus. If you develop symptoms, you  must isolate.  Most current guidelines for COVID after exposure -isolate 10 days if you ARE NOT tested for  COVID as long as symptoms do not develop -isolate 7 days if you are tested and remain asymptomatic -You do not necessarily need to be tested for COVID if you have + exposure and        develop   symptoms. Just isolate at home x10 days from symptom onset During this global pandemic, CDC advises to practice social distancing, try to stay at least  51ft away from others at all times. Wear a face covering. Wash and sanitize your hands regularly and avoid going anywhere that is not necessary.  KEEP IN MIND THAT THE COVID TEST IS NOT 100% ACCURATE AND YOU SHOULD STILL DO EVERYTHING TO PREVENT POTENTIAL SPREAD OF VIRUS TO OTHERS (WEAR MASK, WEAR GLOVES, WASH HANDS AND SANITIZE REGULARLY). IF INITIAL TEST IS NEGATIVE, THIS MAY NOT MEAN YOU ARE DEFINITELY NEGATIVE. MOST ACCURATE TESTING IS DONE 5-7 DAYS AFTER EXPOSURE.   It is not advised by CDC to get re-tested after receiving a positive COVID test since you can still test positive for weeks to months after you have already cleared the virus.   *If you have not been vaccinated for COVID, I strongly suggest you consider getting vaccinated as long as there are no contraindications.

## 2020-12-18 NOTE — ED Provider Notes (Signed)
[facility]    CSN: [id number] Arrival date & time: 12/18/20  1039      History   Chief Complaint Chief Complaint  Patient presents with   Headache   Back Pain   Appointment    Appt. [id number].     HPI Gloria Jones is a 59 y.o. female presenting for 2-day history of lower back pain, nasal congestion, fatigue, cough, and headaches.  Patient states that she was exposed to her Covid positive grandchildren 5 days ago.  Patient does have history of COPD.  She says that she has not had any increased breathing difficulty.  Patient states that her normal oxygen saturation is around 93% and it is unchanged.  She also has history of asthma.  Patient has been using lidocaine patches on her lower back and taking Tessalon Perles and Promethazine DM which she was prescribed at her last visit to [facility] urgent care on 11/27/2020.  She denies any fevers.  Denies any chest pain, vomiting, diarrhea, or change in smell or taste.  No other complaints or concerns today.  HPI  Past Medical History:  Diagnosis Date   Allergic state    Asthma    Bronchitis    COPD (chronic obstructive pulmonary disease) ([place])    Hypertension     Patient Active Problem List   Diagnosis Date Noted   COPD (chronic obstructive pulmonary disease) ([place]) 12/22/2018    Past Surgical History:  Procedure Laterality Date   COLONOSCOPY WITH PROPOFOL N/A 06/28/2017   Procedure: COLONOSCOPY WITH PROPOFOL;  Surgeon: Lollie Sails, MD;  Location: [facility] ENDOSCOPY;  Service: Endoscopy;  Laterality: N/A;   ESOPHAGOGASTRODUODENOSCOPY (EGD) WITH PROPOFOL N/A 06/28/2017   Procedure: ESOPHAGOGASTRODUODENOSCOPY (EGD) WITH PROPOFOL;  Surgeon: Lollie Sails, MD;  Location: [facility] ENDOSCOPY;  Service: Endoscopy;  Laterality: N/A;   KNEE SURGERY Left    OVARY SURGERY Left    partial ovary and fallopian tube removed    OB History   No obstetric history on file.      Home Medications    Prior to  Admission medications   Medication Sig Start Date End Date Taking? Authorizing Provider  azithromycin (ZITHROMAX) 250 MG tablet Take 1 tablet (250 mg total) by mouth daily. Take first 2 tablets together, then 1 every day until finished. 12/18/20  Yes Danton Clap, PA-C  predniSONE (DELTASONE) 20 MG tablet Take 2 tablets (40 mg total) by mouth daily for 5 days. 12/18/20 12/23/20 Yes Laurene Footman B, PA-C  albuterol (PROVENTIL HFA;VENTOLIN HFA) 108 (90 BASE) MCG/ACT inhaler Inhale 1-2 puffs into the lungs every 4 (four) hours as needed for wheezing or shortness of breath. 05/24/15   Betancourt, Aura Fey, NP  beclomethasone (QVAR) 80 MCG/ACT inhaler Inhale 1 puff into the lungs 2 (two) times daily. 12/24/18 04/19/22  Demetrios Loll, MD  benzonatate (TESSALON) 100 MG capsule Take 2 capsules (200 mg total) by mouth every 8 (eight) hours. 11/27/20   Margarette Canada, NP  cyclobenzaprine (FLEXERIL) 10 MG tablet Take 1 tablet (10 mg total) by mouth at bedtime. 06/02/20   Norval Gable, MD  HYDROcodone-acetaminophen (NORCO/VICODIN) 5-325 MG tablet Take 1 tablet by mouth every 4 (four) hours as needed for moderate pain or severe pain. 06/20/20   Faustino Congress, NP  promethazine-dextromethorphan (PROMETHAZINE-DM) 6.25-15 MG/5ML syrup Take 5 mLs by mouth 4 (four) times daily as needed. Patient taking differently: Take 5 mLs by mouth 4 (four) times daily as needed. 11/27/20   Margarette Canada, NP  spironolactone (ALDACTONE) 25  MG tablet Take by mouth. 12/27/18   [provider]  triamcinolone (KENALOG) 0.1 % Apply 1 application topically 2 (two) times daily. 11/27/20   Becky Augusta, NP    Family History Family History  Problem Relation Age of Onset   Hypertension Mother    Colon cancer Maternal Uncle    Breast cancer Maternal Aunt        mat great aunt    Social History Social History   Tobacco Use   Smoking status: Current Every Day Smoker    Packs/day: 1.00    Types: Cigarettes   Smokeless tobacco:  Never Used  Vaping Use   Vaping Use: Never used  Substance Use Topics   Alcohol use: Not Currently    Comment: occasionally   Drug use: Yes    Types: Marijuana     Allergies   Lisinopril, Losartan, and Pravastatin   Review of Systems Review of Systems  Constitutional: Positive for fatigue. Negative for chills, diaphoresis and fever.  HENT: Positive for congestion. Negative for ear pain, rhinorrhea, sinus pressure, sinus pain and sore throat.   Respiratory: Positive for cough. Negative for shortness of breath.   Cardiovascular: Negative for chest pain.  Gastrointestinal: Negative for abdominal pain, nausea and vomiting.  Genitourinary: Negative for difficulty urinating, dysuria and frequency.  Musculoskeletal: Positive for back pain. Negative for arthralgias and myalgias.  Skin: Negative for rash.  Neurological: Positive for headaches. Negative for weakness.  Hematological: Negative for adenopathy.     Physical Exam Triage Vital Signs ED Triage Vitals  Enc Vitals Group     BP 12/18/20 1224 99/78     Pulse Rate 12/18/20 1224 91     Resp 12/18/20 1224 16     Temp 12/18/20 1224 99.6 F (37.6 C)     Temp Source 12/18/20 1224 Oral     SpO2 12/18/20 1224 93 %     Weight --      Height --      Head Circumference --      Peak Flow --      Pain Score 12/18/20 1218 10     Pain Loc --      Pain Edu? --      Excl. in GC? --    No data found.  Updated Vital Signs BP 99/78 (BP Location: Left Arm)    Pulse 91    Temp 99.6 F (37.6 C) (Oral)    Resp 16    LMP 02/08/2015    SpO2 93%       Physical Exam Vitals and nursing note reviewed.  Constitutional:      General: She is not in acute distress.    Appearance: Normal appearance. She is not ill-appearing or toxic-appearing.  HENT:     Head: Normocephalic and atraumatic.     Nose: Nose normal.     Mouth/Throat:     Mouth: Mucous membranes are moist.     Pharynx: Oropharynx is clear.  Eyes:     General: No scleral  icterus.       Right eye: No discharge.        Left eye: No discharge.     Conjunctiva/sclera: Conjunctivae normal.  Cardiovascular:     Rate and Rhythm: Normal rate and regular rhythm.     Heart sounds: Normal heart sounds.  Pulmonary:     Effort: Pulmonary effort is normal. No respiratory distress.     Breath sounds: Wheezing (diffuse wheezing throughout) present.  Musculoskeletal:  Cervical back: Neck supple.  Skin:    General: Skin is dry.  Neurological:     General: No focal deficit present.     Mental Status: She is alert. Mental status is at baseline.     Motor: No weakness.     Gait: Gait normal.  Psychiatric:        Mood and Affect: Mood normal.        Behavior: Behavior normal.        Thought Content: Thought content normal.      UC Treatments / Results  Labs (all labs ordered are listed, but only abnormal results are displayed) Labs Reviewed  RESP PANEL BY RT-PCR (FLU A&B, COVID) ARPGX2 - Abnormal; Notable for the following components:      Result Value   SARS Coronavirus 2 by RT PCR POSITIVE (*)    All other components within normal limits    EKG   Radiology No results found.  Procedures Procedures (including critical care time)  Medications Ordered in UC Medications - No data to display  Initial Impression / Assessment and Plan / UC Course  I have reviewed the triage vital signs and the nursing notes.  Pertinent labs & imaging results that were available during my care of the patient were reviewed by me and considered in my medical decision making (see chart for details).   59 year old female with history of COPD and asthma presenting for 2-day history of cough, body aches and fatigue.  Positive exposure.  Her vital signs are stable.  Her temperature is a little elevated at 99.3 degrees.  Her oxygen saturation is 93%.  I have reviewed previous office visit notes and she is consistently at about 93% which is what she says.  She denies any  significant shortness of breath at this time and is in no acute respiratory or other distress.  Respiratory panel obtained today which is positive for COVID-19.  Discussed results with patient.  CDC guidelines, isolation protocol, and ED precautions reviewed with patient.  Patient is high risk given that she is unvaccinated, African-American, overweight, and has COPD and asthma.  I sent a message to the infusion center to try to get patient on the list for the MAB therapy.  ED precautions thoroughly reviewed again with patient.  I did send azithromycin and prednisone to the pharmacy for her for COPD exacerbation that is mild at this point.   Final Clinical Impressions(s) / UC Diagnoses   Final diagnoses:  COPD exacerbation (HCC)  Cough  Acute bilateral low back pain, unspecified whether sciatica present  Exposure to COVID-19 virus     Discharge Instructions     Treating her COPD exacerbation with azithromycin and prednisone.  Continue her inhalers and breathing treatments as needed.  Take your Tessalon Perles and Promethazine DM as needed for cough.  Increase your rest and fluid intake.  I will call you with the results of your Covid test today.  See more information below.  You have received COVID testing today either for positive exposure, concerning symptoms that could be related to COVID infection, screening purposes, or re-testing after confirmed positive.  Your test obtained today checks for active viral infection in the last 1-2 weeks. If your test is negative now, you can still test positive later. So, if you do develop symptoms you should either get re-tested and/or isolate x 10 days. Please follow CDC guidelines.  While Rapid antigen tests come back in 15-20 minutes, send out PCR/molecular test results typically  come back within 24 hours. In the mean time, if you are symptomatic, assume this could be a positive test and treat/monitor yourself as if you do have COVID.   We will call  with test results. Please download the MyChart app and set up a profile to access test results.   If symptomatic, go home and rest. Push fluids. Take Tylenol as needed for discomfort. Gargle warm salt water. Throat lozenges. Take Mucinex DM or Robitussin for cough. Humidifier in bedroom to ease coughing. Warm showers. Also review the COVID handout for more information.  COVID-19 INFECTION: The incubation period of COVID-19 is approximately 14 days after exposure, with most symptoms developing in roughly 4-5 days. Symptoms may range in severity from mild to critically severe. Roughly 80% of those infected will have mild symptoms. People of any age may become infected with COVID-19 and have the ability to transmit the virus. The most common symptoms include: fever, fatigue, cough, body aches, headaches, sore throat, nasal congestion, shortness of breath, nausea, vomiting, diarrhea, changes in smell and/or taste.    COURSE OF ILLNESS Some patients may begin with mild disease which can progress quickly into critical symptoms. If your symptoms are worsening please call ahead to the Emergency Department and proceed there for further treatment. Recovery time appears to be roughly 1-2 weeks for mild symptoms and 3-6 weeks for severe disease.   GO IMMEDIATELY TO ER FOR FEVER YOU ARE UNABLE TO GET DOWN WITH TYLENOL, BREATHING PROBLEMS, CHEST PAIN, FATIGUE, LETHARGY, INABILITY TO EAT OR DRINK, ETC  QUARANTINE AND ISOLATION: To help decrease the spread of COVID-19 please remain isolated if you have COVID infection or are highly suspected to have COVID infection. This means -stay home and isolate to one room in the home if you live with others. Do not share a bed or bathroom with others while ill, sanitize and wipe down all countertops and keep common areas clean and disinfected. You may discontinue isolation if you have a mild case and are asymptomatic 10 days after symptom onset as long as you have been fever free  >24 hours without having to take Motrin or Tylenol. If your case is more severe (meaning you develop pneumonia or are admitted in the hospital), you may have to isolate longer.   If you have been in close contact (within 6 feet) of someone diagnosed with COVID 19, you are advised to quarantine in your home for 14 days as symptoms can develop anywhere from 2-14 days after exposure to the virus. If you develop symptoms, you  must isolate.  Most current guidelines for COVID after exposure -isolate 10 days if you ARE NOT tested for COVID as long as symptoms do not develop -isolate 7 days if you are tested and remain asymptomatic -You do not necessarily need to be tested for COVID if you have + exposure and        develop   symptoms. Just isolate at home x10 days from symptom onset During this global pandemic, CDC advises to practice social distancing, try to stay at least 5ft away from others at all times. Wear a face covering. Wash and sanitize your hands regularly and avoid going anywhere that is not necessary.  KEEP IN MIND THAT THE COVID TEST IS NOT 100% ACCURATE AND YOU SHOULD STILL DO EVERYTHING TO PREVENT POTENTIAL SPREAD OF VIRUS TO OTHERS (WEAR MASK, WEAR GLOVES, [place] HANDS AND SANITIZE REGULARLY). IF INITIAL TEST IS NEGATIVE, THIS MAY NOT MEAN YOU ARE DEFINITELY NEGATIVE. MOST  ACCURATE TESTING IS DONE 5-7 DAYS AFTER EXPOSURE.   It is not advised by CDC to get re-tested after receiving a positive COVID test since you can still test positive for weeks to months after you have already cleared the virus.   *If you have not been vaccinated for COVID, I strongly suggest you consider getting vaccinated as long as there are no contraindications.      ED Prescriptions    Medication Sig Dispense Auth. Provider   predniSONE (DELTASONE) 20 MG tablet Take 2 tablets (40 mg total) by mouth daily for 5 days. 10 tablet Laurene Footman B, PA-C   azithromycin (ZITHROMAX) 250 MG tablet Take 1 tablet (250 mg  total) by mouth daily. Take first 2 tablets together, then 1 every day until finished. 6 tablet Gretta Cool     PDMP not reviewed this encounter.   Danton Clap, PA-C 12/18/20 1425

## 2021-04-01 ENCOUNTER — Ambulatory Visit: Payer: BC Managed Care – PPO | Admitting: *Deleted

## 2021-04-10 ENCOUNTER — Ambulatory Visit
Admission: RE | Admit: 2021-04-10 | Discharge: 2021-04-10 | Disposition: A | Discharge status: Home / Self Care | Payer: BC Managed Care – PPO | Source: Ambulatory Visit

## 2021-04-10 ENCOUNTER — Other Ambulatory Visit: Payer: Self-pay

## 2021-04-10 VITALS — BP 134/74 | HR 71 | Temp 98.6°F | Resp 15 | Ht 62.0 in | Wt 186.1 lb

## 2021-04-10 DIAGNOSIS — R519 Headache, unspecified: Secondary | ICD-10-CM

## 2021-04-10 DIAGNOSIS — M545 Low back pain, unspecified: Secondary | ICD-10-CM

## 2021-04-10 MED ORDER — DEXAMETHASONE SODIUM PHOSPHATE 10 MG/ML IJ SOLN
10.0000 mg | Freq: Once | INTRAMUSCULAR | Status: AC
  Administered 2021-04-10: 10 mg via INTRAMUSCULAR

## 2021-04-10 MED ORDER — TIZANIDINE HCL 2 MG PO TABS: 2.0000 mg | ORAL_TABLET | Freq: Every day | ORAL | 0 refills | Status: DC

## 2021-04-10 MED ORDER — NAPROXEN 375 MG PO TABS: 375.0000 mg | ORAL_TABLET | Freq: Two times a day (BID) | ORAL | 0 refills | Status: DC

## 2021-04-10 NOTE — ED Provider Notes (Signed)
[facility]    CSN: [id number] Arrival date & time: 04/10/21  1353      History   Chief Complaint Chief Complaint  Patient presents with  . Back Pain  . Headache    HPI Gloria Jones is a 60 y.o. female.   Gloria Jones presents with complaints of headache and sinus pressure which started around three days ago. Lasting longer than her typical headaches. Facial pressure. Light sensitivity. No nausea or vomiting. No head injury. No vision changes. Congestion. Also with low back pain, bilaterally. Doesn't radiate down her legs. No numbness tingling, saddle symptoms or loss of bladder or bowel function. Tylenol has only provided brief relief. No known back injury.    ROS per HPI, negative if not otherwise mentioned.      Past Medical History:  Diagnosis Date  . Allergic state   . Asthma   . Bronchitis   . COPD (chronic obstructive pulmonary disease) ([place])   . Hypertension     Patient Active Problem List   Diagnosis Date Noted  . COPD (chronic obstructive pulmonary disease) ([place]) 12/22/2018    Past Surgical History:  Procedure Laterality Date  . COLONOSCOPY WITH PROPOFOL N/A 06/28/2017   Procedure: COLONOSCOPY WITH PROPOFOL;  Surgeon: Lollie Sails, MD;  Location: [facility] ENDOSCOPY;  Service: Endoscopy;  Laterality: N/A;  . ESOPHAGOGASTRODUODENOSCOPY (EGD) WITH PROPOFOL N/A 06/28/2017   Procedure: ESOPHAGOGASTRODUODENOSCOPY (EGD) WITH PROPOFOL;  Surgeon: Lollie Sails, MD;  Location: [facility] ENDOSCOPY;  Service: Endoscopy;  Laterality: N/A;  . KNEE SURGERY Left   . OVARY SURGERY Left    partial ovary and fallopian tube removed    OB History   No obstetric history on file.      Home Medications    Prior to Admission medications   Medication Sig Start Date End Date Taking? Authorizing Provider  albuterol (PROVENTIL HFA;VENTOLIN HFA) 108 (90 BASE) MCG/ACT inhaler Inhale 1-2 puffs into the lungs every 4 (four) hours as needed for wheezing or  shortness of breath. 05/24/15  Yes Betancourt, Aura Fey, NP  aspirin EC 81 MG tablet Take 81 mg by mouth daily. Swallow whole.   Yes [provider]  beclomethasone (QVAR) 80 MCG/ACT inhaler Inhale 1 puff into the lungs 2 (two) times daily. 12/24/18 04/19/22 Yes Demetrios Loll, MD  lovastatin (MEVACOR) 10 MG tablet SMARTSIG:0.5 Tablet(s) By Mouth Daily on Weekdays 01/14/21  Yes [provider]  naproxen (NAPROSYN) 375 MG tablet Take 1 tablet (375 mg total) by mouth 2 (two) times daily. 04/10/21  Yes Augusto Gamble B, NP  spironolactone (ALDACTONE) 25 MG tablet Take by mouth. 12/27/18  Yes [provider]  tiZANidine (ZANAFLEX) 2 MG tablet Take 1 tablet (2 mg total) by mouth at bedtime. 04/10/21  Yes Demitris Pokorny, Lanelle Bal B, NP  azithromycin (ZITHROMAX) 250 MG tablet Take 1 tablet (250 mg total) by mouth daily. Take first 2 tablets together, then 1 every day until finished. 12/18/20   Danton Clap, PA-C  benzonatate (TESSALON) 100 MG capsule Take 2 capsules (200 mg total) by mouth every 8 (eight) hours. 11/27/20   Margarette Canada, NP  cyclobenzaprine (FLEXERIL) 10 MG tablet Take 1 tablet (10 mg total) by mouth at bedtime. 06/02/20   Norval Gable, MD  HYDROcodone-acetaminophen (NORCO/VICODIN) 5-325 MG tablet Take 1 tablet by mouth every 4 (four) hours as needed for moderate pain or severe pain. 06/20/20   Faustino Congress, NP  promethazine-dextromethorphan (PROMETHAZINE-DM) 6.25-15 MG/5ML syrup Take 5 mLs by mouth 4 (four) times daily  as needed. Patient taking differently: Take 5 mLs by mouth 4 (four) times daily as needed. 11/27/20   Margarette Canada, NP  triamcinolone (KENALOG) 0.1 % Apply 1 application topically 2 (two) times daily. 11/27/20   Margarette Canada, NP    Family History Family History  Problem Relation Age of Onset  . Hypertension Mother   . Colon cancer Maternal Uncle   . Breast cancer Maternal Aunt        mat great aunt    Social History Social History   Tobacco Use  . Smoking  status: Current Every Day Smoker    Packs/day: 1.00    Types: Cigarettes  . Smokeless tobacco: Never Used  Vaping Use  . Vaping Use: Never used  Substance Use Topics  . Alcohol use: Not Currently    Comment: occasionally  . Drug use: Yes    Types: Marijuana     Allergies   Lisinopril, Losartan, and Pravastatin   Review of Systems Review of Systems   Physical Exam Triage Vital Signs ED Triage Vitals  Enc Vitals Group     BP 04/10/21 1402 134/74     Pulse Rate 04/10/21 1402 71     Resp 04/10/21 1402 15     Temp 04/10/21 1402 98.6 F (37 C)     Temp Source 04/10/21 1402 Oral     SpO2 04/10/21 1402 91 %     Weight 04/10/21 1359 186 lb 1.1 oz (84.4 kg)     Height 04/10/21 1359 5\' 2"  (1.575 m)     Head Circumference --      Peak Flow --      Pain Score 04/10/21 1359 10     Pain Loc --      Pain Edu? --      Excl. in [place]? --    No data found.  Updated Vital Signs BP 134/74 (BP Location: Left Arm)   Pulse 71   Temp 98.6 F (37 C) (Oral)   Resp 15   Ht 5\' 2"  (1.575 m)   Wt 186 lb 1.1 oz (84.4 kg)   LMP 02/08/2015   SpO2 91%   BMI 34.03 kg/m   Visual Acuity Right Eye Distance:   Left Eye Distance:   Bilateral Distance:    Right Eye Near:   Left Eye Near:    Bilateral Near:     Physical Exam Constitutional:      General: She is not in acute distress.    Appearance: She is well-developed.  HENT:     Nose:     Right Sinus: Maxillary sinus tenderness present.     Left Sinus: Maxillary sinus tenderness present.  Cardiovascular:     Rate and Rhythm: Normal rate.  Pulmonary:     Effort: Pulmonary effort is normal.  Musculoskeletal:     Lumbar back: Tenderness present. No bony tenderness. Negative right straight leg raise test and negative left straight leg raise test.  Skin:    General: Skin is warm and dry.  Neurological:     Mental Status: She is alert and oriented to person, place, and time.     Cranial Nerves: No cranial nerve deficit or facial  asymmetry.     Sensory: No sensory deficit.      UC Treatments / Results  Labs (all labs ordered are listed, but only abnormal results are displayed) Labs Reviewed - No data to display  EKG   Radiology No results found.  Procedures Procedures (including critical care  time)  Medications Ordered in UC Medications  dexamethasone (DECADRON) injection 10 mg (has no administration in time range)    Initial Impression / Assessment and Plan / UC Course  I have reviewed the triage vital signs and the nursing notes.  Pertinent labs & imaging results that were available during my care of the patient were reviewed by me and considered in my medical decision making (see chart for details).     No red flag findings. Decadron provided to aid in facial pressure/ headache as well as hopefully help with acute low back pain. nsaids additionally provided as well as muscle relaxer. nitrosamine recommended as well. Return precautions provided. Patient verbalized understanding and agreeable to plan.  Ambulatory out of clinic without difficulty.   Final Clinical Impressions(s) / UC Diagnoses   Final diagnoses:  Acute bilateral low back pain without sciatica  Sinus headache     Discharge Instructions     Light and regular activity as tolerated.  See exercises provided.  Heat application while active can help with muscle spasms.  Sleep with pillow under your knees.   Tizanidine as a muscle relaxer may be taken at bedtime, may cause drowsiness.  Naproxen twice a day as needed for back pain, take with food. Won't cause drowsiness.  I do recommend taking a daily allergy medication as well such as zyrtec or claritin to help prevent sinus headache.  If symptoms worsen or do not improve in the next week to return to be seen or to follow up with your PCP.     ED Prescriptions    Medication Sig Dispense Auth. Provider   naproxen (NAPROSYN) 375 MG tablet Take 1 tablet (375 mg total) by mouth 2  (two) times daily. 20 tablet Augusto Gamble B, NP   tiZANidine (ZANAFLEX) 2 MG tablet Take 1 tablet (2 mg total) by mouth at bedtime. 20 tablet Zigmund Gottron, NP     PDMP not reviewed this encounter.   Zigmund Gottron, NP 04/10/21 1446

## 2021-04-10 NOTE — Discharge Instructions (Addendum)
Light and regular activity as tolerated.  See exercises provided.  Heat application while active can help with muscle spasms.  Sleep with pillow under your knees.   Tizanidine as a muscle relaxer may be taken at bedtime, may cause drowsiness.  Naproxen twice a day as needed for back pain, take with food. Won't cause drowsiness.  I do recommend taking a daily allergy medication as well such as zyrtec or claritin to help prevent sinus headache.  If symptoms worsen or do not improve in the next week to return to be seen or to follow up with your PCP.

## 2021-04-10 NOTE — ED Triage Notes (Addendum)
Patient c/o rash under or below her right breast a few days ago.  Patient c/o headache and back pain that started 3-5 days ago.  Patient denies fevers.  Patient had COVID in December.  Patient has history of COPD.

## 2021-04-21 ENCOUNTER — Ambulatory Visit: Payer: BC Managed Care – PPO | Admitting: *Deleted

## 2021-07-06 ENCOUNTER — Other Ambulatory Visit: Payer: Self-pay | Admitting: Family Medicine

## 2021-07-06 DIAGNOSIS — Z1231 Encounter for screening mammogram for malignant neoplasm of breast: Secondary | ICD-10-CM

## 2021-07-07 ENCOUNTER — Ambulatory Visit (INDEPENDENT_AMBULATORY_CARE_PROVIDER_SITE_OTHER): Payer: BC Managed Care – PPO | Admitting: Urology

## 2021-07-07 ENCOUNTER — Encounter: Payer: Self-pay | Admitting: Urology

## 2021-07-07 ENCOUNTER — Ambulatory Visit
Admission: RE | Admit: 2021-07-07 | Discharge: 2021-07-07 | Disposition: A | Discharge status: Home / Self Care | Payer: BC Managed Care – PPO | Attending: Urology | Admitting: Urology

## 2021-07-07 ENCOUNTER — Ambulatory Visit
Admission: RE | Admit: 2021-07-07 | Discharge: 2021-07-07 | Disposition: A | Discharge status: Home / Self Care | Payer: BC Managed Care – PPO | Source: Ambulatory Visit | Attending: Urology | Admitting: Urology

## 2021-07-07 ENCOUNTER — Other Ambulatory Visit: Payer: Self-pay

## 2021-07-07 VITALS — BP 152/99 | HR 78 | Ht 63.0 in | Wt 190.0 lb

## 2021-07-07 DIAGNOSIS — N200 Calculus of kidney: Secondary | ICD-10-CM | POA: Insufficient documentation

## 2021-07-07 DIAGNOSIS — R310 Gross hematuria: Secondary | ICD-10-CM | POA: Diagnosis not present

## 2021-07-07 NOTE — Patient Instructions (Signed)
Dietary Guidelines to Help Prevent Kidney Stones Kidney stones are deposits of minerals and salts that form inside your kidneys. Your risk of developing kidney stones may be greater depending on your diet, your lifestyle, the medicines you take, and whether you have certain medical conditions. Most people can lower their chances of developing kidney stones by following the instructions below. Your dietitian may give you more specific instructions depending on your overall health and the type of kidney stones you tend to develop. What are tips for following this plan? Reading food labels  Choose foods with "no salt added" or "low-salt" labels. Limit your salt (sodium) intake to less than 1,500 mg a day. Choose foods with calcium for each meal and snack. Try to eat about 300 mg of calcium at each meal. Foods that contain 200-500 mg of calcium a serving include: 8 oz (237 mL) of milk, calcium-fortifiednon-dairy milk, and calcium-fortifiedfruit juice. Calcium-fortified means that calcium has been added to these drinks. 8 oz (237 mL) of kefir, yogurt, and soy yogurt. 4 oz (114 g) of tofu. 1 oz (28 g) of cheese. 1 cup (150 g) of dried figs. 1 cup (91 g) of cooked broccoli. One 3 oz (85 g) can of sardines or mackerel. Most people need 1,000-1,500 mg of calcium a day. Talk to your dietitian about how much calcium is recommended for you. Shopping Buy plenty of fresh fruits and vegetables. Most people do not need to avoid fruits and vegetables, even if these foods contain nutrients that may contribute to kidney stones. When shopping for convenience foods, choose: Whole pieces of fruit. Pre-made salads with dressing on the side. Low-fat fruit and yogurt smoothies. Avoid buying frozen meals or prepared deli foods. These can be high in sodium. Look for foods with live cultures, such as yogurt and kefir. Choose high-fiber grains, such as whole-wheat breads, oat bran, and wheat cereals. Cooking Do not add  salt to food when cooking. Place a salt shaker on the table and allow each person to add his or her own salt to taste. Use vegetable protein, such as beans, textured vegetable protein (TVP), or tofu, instead of meat in pasta, casseroles, and soups. Meal planning Eat less salt, if told by your dietitian. To do this: Avoid eating processed or pre-made food. Avoid eating fast food. Eat less animal protein, including cheese, meat, poultry, or fish, if told by your dietitian. To do this: Limit the number of times you have meat, poultry, fish, or cheese each week. Eat a diet free of meat at least 2 days a week. Eat only one serving each day of meat, poultry, fish, or seafood. When you prepare animal protein, cut pieces into small portion sizes. For most meat and fish, one serving is about the size of the palm of your hand. Eat at least five servings of fresh fruits and vegetables each day. To do this: Keep fruits and vegetables on hand for snacks. Eat one piece of fruit or a handful of berries with breakfast. Have a salad and fruit at lunch. Have two kinds of vegetables at dinner. Limit foods that are high in a substance called oxalate. These include: Spinach (cooked), rhubarb, beets, sweet potatoes, and Swiss chard. Peanuts. Potato chips, french fries, and baked potatoes with skin on. Nuts and nut products. Chocolate. If you regularly take a diuretic medicine, make sure to eat at least 1 or 2 servings of fruits or vegetables that are high in potassium each day. These include: Avocado. Banana. Orange, prune,   carrot, or tomato juice. Baked potato. Cabbage. Beans and split peas. Lifestyle  Drink enough fluid to keep your urine pale yellow. This is the most important thing you can do. Spread your fluid intake throughout the day. If you drink alcohol: Limit how much you use to: 0-1 drink a day for women who are not pregnant. 0-2 drinks a day for men. Be aware of how much alcohol is in your  drink. In the [place] one drink equals one 12 oz bottle of beer (355 mL), one 5 oz glass of wine (148 mL), or one 1 oz glass of hard liquor (44 mL). Lose weight if told by your health care provider. Work with your dietitian to find an eating plan and weight loss strategies that work best for you. General information Talk to your health care provider and dietitian about taking daily supplements. You may be told the following depending on your health and the cause of your kidney stones: Not to take supplements with vitamin C. To take a calcium supplement. To take a daily probiotic supplement. To take other supplements such as magnesium, fish oil, or vitamin B6. Take over-the-counter and prescription medicines only as told by your health care provider. These include supplements. What foods should I limit? Limit your intake of the following foods, or eat them as told by your dietitian. Vegetables Spinach. Rhubarb. Beets. Canned vegetables. Pickles. Olives. Baked potatoes with skin. Grains Wheat bran. Baked goods. Salted crackers. Cereals high in sugar. Meats and other proteins Nuts. Nut butters. Large portions of meat, poultry, or fish. Salted, precooked, or cured meats, such as sausages, meat loaves, and hot dogs. Dairy Cheese. Beverages Regular soft drinks. Regular vegetable juice. Seasonings and condiments Seasoning blends with salt. Salad dressings. Soy sauce. Ketchup. Barbecue sauce. Other foods Canned soups. Canned pasta sauce. Casseroles. Pizza. Lasagna. Frozen meals. Potato chips. French fries. The items listed above may not be a complete list of foods and beverages you should limit. Contact a dietitian for more information. What foods should I avoid? Talk to your dietitian about specific foods you should avoid based on the type of kidney stones you have and your overall health. Fruits Grapefruit. The item listed above may not be a complete list of foods and beverages you should  avoid. Contact a dietitian for more information. Summary Kidney stones are deposits of minerals and salts that form inside your kidneys. You can lower your risk of kidney stones by making changes to your diet. The most important thing you can do is drink enough fluid. Drink enough fluid to keep your urine pale yellow. Talk to your dietitian about how much calcium you should have each day, and eat less salt and animal protein as told by your dietitian. This information is not intended to replace advice given to you by your health care provider. Make sure you discuss any questions you have with your health care provider. Document Revised: 11/29/2019 Document Reviewed: 11/29/2019 Elsevier Patient Education  2022 Elsevier Inc.  

## 2021-07-07 NOTE — Progress Notes (Signed)
   07/07/2021 12:55 PM   Delorese Orie Fisherman [birth year] [id number]  Reason for visit: Follow up gross hematuria, right renal stone  HPI: 60 year old female here for routine follow-up of the above issues.  She underwent a gross hematuria work-up last year which was negative aside from a 1.3 cm right lower pole stone that has been stable for at least 1 year.  She is asymptomatic with no flank pain, recurrent gross hematuria, or UTIs.  I personally viewed and interpreted her KUB today that shows unchanged size and location of the 1.3 cm right lower pole stone.  We again reviewed options including active surveillance, shockwave lithotripsy, or ureteroscopy and laser lithotripsy.  As she is asymptomatic, she would like to continue with surveillance.  Return precautions discussed including flank pain, UTIs, or gross hematuria.  RTC 1 year KUB prior  Billey Co, MD  [facility] [address] [place], [place] [address] [phone]

## 2021-07-15 ENCOUNTER — Ambulatory Visit
Admission: RE | Admit: 2021-07-15 | Discharge: 2021-07-15 | Disposition: A | Discharge status: Home / Self Care | Payer: BC Managed Care – PPO | Source: Ambulatory Visit | Attending: Family Medicine | Admitting: Family Medicine

## 2021-07-15 ENCOUNTER — Other Ambulatory Visit: Payer: Self-pay

## 2021-07-15 VITALS — BP 127/104 | HR 74 | Temp 99.6°F | Resp 18 | Ht 62.0 in | Wt 190.0 lb

## 2021-07-15 DIAGNOSIS — B029 Zoster without complications: Secondary | ICD-10-CM

## 2021-07-15 MED ORDER — VALACYCLOVIR HCL 1 G PO TABS: 1000.0000 mg | ORAL_TABLET | Freq: Three times a day (TID) | ORAL | 0 refills | Status: DC

## 2021-07-15 MED ORDER — PREDNISONE 10 MG PO TABS: ORAL_TABLET | ORAL | 0 refills | Status: DC

## 2021-07-15 NOTE — ED Triage Notes (Addendum)
Pt c/o rash area to her lower back. Pt states the bumps have been there about 2 days, are itching and sometimes painful. Pt is concerned about shingles. Pt states there are also some areas with bumps on her legs.

## 2021-07-16 NOTE — ED Provider Notes (Signed)
[facility]    CSN: [id number] Arrival date & time: 07/15/21  1706      History   Chief Complaint Chief Complaint  Patient presents with   Rash    HPI 60 year old female presents with rash.   Patient reports that she has had a painful, raised rash on her back. Started 2 days ago. She states that the area burns. No relieving factors. She is concerned that she may have shingles. No other complaints.   Past Medical History:  Diagnosis Date   Allergic state    Asthma    Bronchitis    COPD (chronic obstructive pulmonary disease) ([place])    Hypertension     Patient Active Problem List   Diagnosis Date Noted   COPD (chronic obstructive pulmonary disease) ([place]) 12/22/2018    Past Surgical History:  Procedure Laterality Date   COLONOSCOPY WITH PROPOFOL N/A 06/28/2017   Procedure: COLONOSCOPY WITH PROPOFOL;  Surgeon: Lollie Sails, MD;  Location: [facility] ENDOSCOPY;  Service: Endoscopy;  Laterality: N/A;   ESOPHAGOGASTRODUODENOSCOPY (EGD) WITH PROPOFOL N/A 06/28/2017   Procedure: ESOPHAGOGASTRODUODENOSCOPY (EGD) WITH PROPOFOL;  Surgeon: Lollie Sails, MD;  Location[facility] ENDOSCOPY;  Service: Endoscopy;  Laterality: N/A;   KNEE SURGERY Left    OVARY SURGERY Left    partial ovary and fallopian tube removed    OB History   No obstetric history on file.      Home Medications    Prior to Admission medications   Medication Sig Start Date End Date Taking? Authorizing Provider  albuterol (PROVENTIL HFA;VENTOLIN HFA) 108 (90 BASE) MCG/ACT inhaler Inhale 1-2 puffs into the lungs every 4 (four) hours as needed for wheezing or shortness of breath. 05/24/15  Yes Betancourt, Aura Fey, NP  aspirin EC 81 MG tablet Take 81 mg by mouth daily. Swallow whole.   Yes [provider]  beclomethasone (QVAR) 80 MCG/ACT inhaler Inhale 1 puff into the lungs 2 (two) times daily. 12/24/18 04/19/22 Yes Demetrios Loll, MD  predniSONE (DELTASONE) 10 MG tablet 50 mg daily x 2 days, then 40  mg daily x 2 days, then 30 mg daily x 2 days, then 20 mg daily x 2 days, then 10 mg daily x 2 days. 07/15/21  Yes Jaquari Reckner G, DO  spironolactone (ALDACTONE) 25 MG tablet Take by mouth. 12/27/18  Yes [provider]  valACYclovir (VALTREX) 1000 MG tablet Take 1 tablet (1,000 mg total) by mouth 3 (three) times daily. 07/15/21  Yes Coral Spikes, DO    Family History Family History  Problem Relation Age of Onset   Hypertension Mother    Colon cancer Maternal Uncle    Breast cancer Maternal Aunt        mat great aunt    Social History Social History   Tobacco Use   Smoking status: Every Day    Packs/day: 1.00    Types: Cigarettes   Smokeless tobacco: Never  Vaping Use   Vaping Use: Never used  Substance Use Topics   Alcohol use: Not Currently    Comment: occasionally   Drug use: Yes    Types: Marijuana     Allergies   Simvastatin, Statins, Lisinopril, Losartan, and Pravastatin   Review of Systems Review of Systems  Constitutional: Negative.   Skin:  Positive for rash.    Physical Exam Triage Vital Signs ED Triage Vitals  Enc Vitals Group     BP 07/15/21 1732 (!) 127/104     Pulse Rate 07/15/21  1732 74     Resp 07/15/21 1732 18     Temp 07/15/21 1732 99.6 F (37.6 C)     Temp Source 07/15/21 1732 Oral     SpO2 07/15/21 1732 92 %     Weight 07/15/21 1728 190 lb (86.2 kg)     Height 07/15/21 1728 '5\' 2"'$  (1.575 m)     Head Circumference --      Peak Flow --      Pain Score 07/15/21 1728 0     Pain Loc --      Pain Edu? --      Excl. in [place]? --    No data found.  Updated Vital Signs BP (!) 127/104 (BP Location: Left Arm)   Pulse 74   Temp 99.6 F (37.6 C) (Oral)   Resp 18   Ht '5\' 2"'$  (1.575 m)   Wt 86.2 kg   LMP 02/08/2015   SpO2 92%   BMI 34.75 kg/m   Visual Acuity Right Eye Distance:   Left Eye Distance:   Bilateral Distance:    Right Eye Near:   Left Eye Near:    Bilateral Near:     Physical Exam Vitals and nursing note reviewed.   Constitutional:      General: She is not in acute distress.    Appearance: Normal appearance. She is not ill-appearing.  HENT:     Head: Normocephalic and atraumatic.  Pulmonary:     Effort: Pulmonary effort is normal. No respiratory distress.  Skin:    Comments: Lower thoracic spine with a erythematous vesicular rash just to the left of midline.   Neurological:     Mental Status: She is alert.     UC Treatments / Results  Labs (all labs ordered are listed, but only abnormal results are displayed) Labs Reviewed - No data to display  EKG   Radiology No results found.  Procedures Procedures (including critical care time)  Medications Ordered in UC Medications - No data to display  Initial Impression / Assessment and Plan / UC Course  I have reviewed the triage vital signs and the nursing notes.  Pertinent labs & imaging results that were available during my care of the patient were reviewed by me and considered in my medical decision making (see chart for details).    60 year old female presents with herpes zoster. Treating with Valtrex and Prednisone.   Final Clinical Impressions(s) / UC Diagnoses   Final diagnoses:  Herpes zoster without complication   Discharge Instructions   None    ED Prescriptions     Medication Sig Dispense Auth. Provider   valACYclovir (VALTREX) 1000 MG tablet Take 1 tablet (1,000 mg total) by mouth 3 (three) times daily. 21 tablet Danyele Smejkal G, DO   predniSONE (DELTASONE) 10 MG tablet 50 mg daily x 2 days, then 40 mg daily x 2 days, then 30 mg daily x 2 days, then 20 mg daily x 2 days, then 10 mg daily x 2 days. 30 tablet Coral Spikes, DO      PDMP not reviewed this encounter.   Coral Spikes, [place] 07/16/21 2206

## 2021-08-17 ENCOUNTER — Other Ambulatory Visit: Payer: Self-pay

## 2021-08-17 ENCOUNTER — Ambulatory Visit
Admission: RE | Admit: 2021-08-17 | Discharge: 2021-08-17 | Disposition: A | Discharge status: Home / Self Care | Payer: BC Managed Care – PPO | Source: Ambulatory Visit | Attending: Physician Assistant | Admitting: Physician Assistant

## 2021-08-17 DIAGNOSIS — U071 COVID-19: Secondary | ICD-10-CM | POA: Insufficient documentation

## 2021-08-17 NOTE — Discharge Instructions (Signed)

## 2021-08-17 NOTE — ED Triage Notes (Signed)
Pt presents to [place] for covid testing. She was exposed to someone with covid about 3 days ago.

## 2021-08-18 ENCOUNTER — Telehealth (HOSPITAL_COMMUNITY): Payer: Self-pay | Admitting: Emergency Medicine

## 2021-08-18 LAB — SARS CORONAVIRUS 2 (TAT 6-24 HRS): SARS Coronavirus 2: POSITIVE — AB

## 2021-08-18 MED ORDER — NIRMATRELVIR/RITONAVIR (PAXLOVID)TABLET: 3.0000 | ORAL_TABLET | Freq: Two times a day (BID) | ORAL | 0 refills | Status: DC

## 2021-08-18 MED ORDER — NIRMATRELVIR/RITONAVIR (PAXLOVID)TABLET: 3.0000 | ORAL_TABLET | Freq: Two times a day (BID) | ORAL | 0 refills | Status: AC

## 2021-08-18 NOTE — Telephone Encounter (Signed)
Patient seen for nurse visit, COVID positive, requested antivirals.  Paxlovid okay'd by Dr. Lanny Cramp

## 2021-10-02 ENCOUNTER — Other Ambulatory Visit: Payer: Self-pay

## 2021-10-02 ENCOUNTER — Encounter: Payer: Self-pay | Admitting: Emergency Medicine

## 2021-10-02 ENCOUNTER — Ambulatory Visit
Admission: EM | Admit: 2021-10-02 | Discharge: 2021-10-02 | Disposition: A | Discharge status: Home / Self Care | Payer: BC Managed Care – PPO | Attending: Physician Assistant | Admitting: Physician Assistant

## 2021-10-02 DIAGNOSIS — U099 Post covid-19 condition, unspecified: Secondary | ICD-10-CM | POA: Diagnosis not present

## 2021-10-02 DIAGNOSIS — J449 Chronic obstructive pulmonary disease, unspecified: Secondary | ICD-10-CM | POA: Diagnosis not present

## 2021-10-02 MED ORDER — AZITHROMYCIN 500 MG PO TABS: 500.0000 mg | ORAL_TABLET | Freq: Every day | ORAL | 0 refills | Status: AC

## 2021-10-02 MED ORDER — QVAR REDIHALER 80 MCG/ACT IN AERB: 1.0000 | INHALATION_SPRAY | Freq: Two times a day (BID) | RESPIRATORY_TRACT | 0 refills | Status: DC

## 2021-10-02 MED ORDER — BECLOMETHASONE DIPROP HFA 80 MCG/ACT IN AERB: 1.0000 | INHALATION_SPRAY | Freq: Two times a day (BID) | RESPIRATORY_TRACT | 2 refills | Status: DC

## 2021-10-02 NOTE — ED Triage Notes (Signed)
Reports cough and shortness of breath that started after dusting.  States she has had a steroid course and cough meds that have not helped. Symptoms for 2 weeks.

## 2021-10-02 NOTE — ED Provider Notes (Signed)
[facility]    CSN: [id number] Arrival date & time: 10/02/21  1346      History   Chief Complaint Chief Complaint  Patient presents with   Shortness of Breath    HPI Gloria Jones is a 60 y.o. female.   Patient is a 60 year old female who presents with chief complaint of continued cough and wheezing.  Patient states she had COVID about 1 month ago.  Patient has continued to have coughing and reports some clear phlegm production.  Patient has been seen by her primary care and has had 2 back-to-back 5-day courses of prednisone 50 mg daily.  Despite the prednisone course, she reports continued cough without improvement.  Patient also reports that she has been resting and believes that has added to her symptomology.  Patient also reports she is out of her Qvar inhaler but does have her albuterol that she can use.   Past Medical History:  Diagnosis Date   Allergic state    Asthma    Bronchitis    COPD (chronic obstructive pulmonary disease) ([place])    Hypertension     Patient Active Problem List   Diagnosis Date Noted   COPD (chronic obstructive pulmonary disease) ([place]) 12/22/2018    Past Surgical History:  Procedure Laterality Date   COLONOSCOPY WITH PROPOFOL N/A 06/28/2017   Procedure: COLONOSCOPY WITH PROPOFOL;  Surgeon: Lollie Sails, MD;  Location: [facility] ENDOSCOPY;  Service: Endoscopy;  Laterality: N/A;   ESOPHAGOGASTRODUODENOSCOPY (EGD) WITH PROPOFOL N/A 06/28/2017   Procedure: ESOPHAGOGASTRODUODENOSCOPY (EGD) WITH PROPOFOL;  Surgeon: Lollie Sails, MD;  Location: [facility] ENDOSCOPY;  Service: Endoscopy;  Laterality: N/A;   KNEE SURGERY Left    OVARY SURGERY Left    partial ovary and fallopian tube removed    OB History   No obstetric history on file.      Home Medications    Prior to Admission medications   Medication Sig Start Date End Date Taking? Authorizing Provider  albuterol (PROVENTIL HFA;VENTOLIN HFA) 108 (90 BASE) MCG/ACT inhaler  Inhale 1-2 puffs into the lungs every 4 (four) hours as needed for wheezing or shortness of breath. 05/24/15  Yes Betancourt, Aura Fey, NP  aspirin EC 81 MG tablet Take 81 mg by mouth daily. Swallow whole.   Yes [provider]  beclomethasone (QVAR) 80 MCG/ACT inhaler Inhale 1 puff into the lungs 2 (two) times daily. 10/02/21  Yes Luvenia Redden, PA-C  predniSONE (DELTASONE) 10 MG tablet 50 mg daily x 2 days, then 40 mg daily x 2 days, then 30 mg daily x 2 days, then 20 mg daily x 2 days, then 10 mg daily x 2 days. 07/15/21  Yes Cook, Jayce G, DO  spironolactone (ALDACTONE) 25 MG tablet Take by mouth. 12/27/18  Yes [provider]  azithromycin (ZITHROMAX) 500 MG tablet Take 1 tablet (500 mg total) by mouth daily for 5 days. 10/02/21 10/07/21 Yes Luvenia Redden, PA-C  valACYclovir (VALTREX) 1000 MG tablet Take 1 tablet (1,000 mg total) by mouth 3 (three) times daily. 07/15/21   Coral Spikes, DO    Family History Family History  Problem Relation Age of Onset   Hypertension Mother    Colon cancer Maternal Uncle    Breast cancer Maternal Aunt        mat great aunt    Social History Social History   Tobacco Use   Smoking status: Every Day    Packs/day: 1.00    Types: Cigarettes   Smokeless  tobacco: Never  Vaping Use   Vaping Use: Never used  Substance Use Topics   Alcohol use: Not Currently    Comment: occasionally   Drug use: Yes    Types: Marijuana     Allergies   Simvastatin, Statins, Lisinopril, Losartan, and Pravastatin   Review of Systems Review of Systems as noted above in HPI.  Other systems reviewed and found to be negative   Physical Exam Triage Vital Signs ED Triage Vitals  Enc Vitals Group     BP 10/02/21 1447 (!) 156/89     Pulse Rate 10/02/21 1447 84     Resp 10/02/21 1447 20     Temp 10/02/21 1447 99.5 F (37.5 C)     Temp Source 10/02/21 1447 Oral     SpO2 10/02/21 1447 90 %     Weight --      Height --      Head Circumference --       Peak Flow --      Pain Score 10/02/21 1445 2     Pain Loc --      Pain Edu? --      Excl. in [place]? --    No data found.  Updated Vital Signs BP (!) 156/89   Pulse 84   Temp 99.5 F (37.5 C) (Oral)   Resp 20   LMP 02/08/2015   SpO2 90%    Physical Exam Constitutional:      Appearance: She is well-developed.  HENT:     Head: Normocephalic and atraumatic.  Cardiovascular:     Rate and Rhythm: Normal rate and regular rhythm.  Pulmonary:     Effort: Pulmonary effort is normal.     Comments: Diffuse coarse breath sounds with wheezing Musculoskeletal:        General: Normal range of motion.     Cervical back: Normal range of motion and neck supple.  Skin:    General: Skin is warm and dry.  Neurological:     General: No focal deficit present.     Mental Status: She is alert.     UC Treatments / Results  Labs (all labs ordered are listed, but only abnormal results are displayed) Labs Reviewed - No data to display  EKG   Radiology No results found.  Procedures Procedures (including critical care time)  Medications Ordered in UC Medications - No data to display  Initial Impression / Assessment and Plan / UC Course  I have reviewed the triage vital signs and the nursing notes.  Pertinent labs & imaging results that were available during my care of the patient were reviewed by me and considered in my medical decision making (see chart for details).    Patient with history of COPD on daily inhaled steroid and rescue albuterol.  Patient also status post COVID last month and states she is out of her Qvar inhaler.  Patient also reports testing the last couple weeks which she believes is adding to her symptoms.  Again, patient is just completed a 10-day course of 50 mg of prednisone daily so I will hold off on giving her further steroids.  We will refill her Qvar inhaler.  Additionally we will give her prescription for azithromycin 500 mg for 5-day course.  She can  continue her albuterol for her cough.  Follow-up with her primary care.  Final Clinical Impressions(s) / UC Diagnoses   Final diagnoses:  Chronic obstructive pulmonary disease, unspecified COPD type ([place])  Post covid-19 condition,  unspecified     Discharge Instructions      -Azithromycin: 1 tablet daily for 5 days -Qvar inhaler refilled -Can use her albuterol inhaler every 6 hours as needed for continued cough -Follow-up with primary care should symptoms not improve     ED Prescriptions     Medication Sig Dispense Auth. Provider   beclomethasone (QVAR REDIHALER) 80 MCG/ACT inhaler  (Status: Discontinued) Inhale 1 puff into the lungs 2 (two) times daily. 1 each Luvenia Redden, PA-C   azithromycin (ZITHROMAX) 500 MG tablet Take 1 tablet (500 mg total) by mouth daily for 5 days. 5 tablet Luvenia Redden, PA-C   beclomethasone (QVAR) 80 MCG/ACT inhaler Inhale 1 puff into the lungs 2 (two) times daily. 1 each Luvenia Redden, PA-C      PDMP not reviewed this encounter.   Luvenia Redden, PA-C 10/02/21 1529

## 2021-10-02 NOTE — Discharge Instructions (Addendum)
-  Azithromycin: 1 tablet daily for 5 days -Qvar inhaler refilled -Can use her albuterol inhaler every 6 hours as needed for continued cough -Follow-up with primary care should symptoms not improve

## 2021-10-02 NOTE — ED Notes (Signed)
Called pt phone x 1 unable to leave voicemail

## 2021-10-02 NOTE — ED Triage Notes (Signed)
States she had covid last month and cough has persisted

## 2021-11-30 ENCOUNTER — Encounter: Payer: Self-pay | Admitting: Emergency Medicine

## 2021-11-30 ENCOUNTER — Ambulatory Visit
Admission: EM | Admit: 2021-11-30 | Discharge: 2021-11-30 | Disposition: A | Discharge status: Home / Self Care | Payer: BC Managed Care – PPO | Attending: Emergency Medicine | Admitting: Emergency Medicine

## 2021-11-30 ENCOUNTER — Other Ambulatory Visit: Payer: Self-pay

## 2021-11-30 DIAGNOSIS — R0602 Shortness of breath: Secondary | ICD-10-CM

## 2021-11-30 DIAGNOSIS — J441 Chronic obstructive pulmonary disease with (acute) exacerbation: Secondary | ICD-10-CM

## 2021-11-30 DIAGNOSIS — R062 Wheezing: Secondary | ICD-10-CM

## 2021-11-30 MED ORDER — PREDNISONE 10 MG (21) PO TBPK: ORAL_TABLET | Freq: Every day | ORAL | 0 refills | Status: DC

## 2021-11-30 MED ORDER — MONTELUKAST SODIUM 10 MG PO TABS: 10.0000 mg | ORAL_TABLET | Freq: Every day | ORAL | 0 refills | Status: DC

## 2021-11-30 MED ORDER — SPIRIVA HANDIHALER 18 MCG IN CAPS: 18.0000 ug | ORAL_CAPSULE | Freq: Every day | RESPIRATORY_TRACT | 12 refills | Status: DC

## 2021-11-30 MED ORDER — METHYLPREDNISOLONE SODIUM SUCC 125 MG IJ SOLR
125.0000 mg | Freq: Once | INTRAMUSCULAR | Status: AC
  Administered 2021-11-30: 125 mg via INTRAMUSCULAR

## 2021-11-30 NOTE — ED Provider Notes (Signed)
[facility]    CSN: [id number] Arrival date & time: 11/30/21  1312      History   Chief Complaint Chief Complaint  Patient presents with   Shortness of Breath   Wheezing    HPI Gloria Jones is a 60 y.o. female.   Patient is here today due to shortness of breath and wheezing.  Patient states that she became homeless and has to live with her daughter who have cats.  Patient has an allergy to cats and becomes short of breath and begins to have wheezing.  Patient has been using her albuterol inhaler but states that she does not feel that it is working well.  Patient was seen last month for the same and was given prednisone to go home with.  Patient's O2 sats are 88% on room air and patient has audible wheezing in the room.  Patient states that she does not want to go to the hospital.  Patient is alert and oriented talking to staff.   Past Medical History:  Diagnosis Date   Allergic state    Asthma    Bronchitis    COPD (chronic obstructive pulmonary disease) ([place])    Hypertension     Patient Active Problem List   Diagnosis Date Noted   COPD (chronic obstructive pulmonary disease) ([place]) 12/22/2018    Past Surgical History:  Procedure Laterality Date   COLONOSCOPY WITH PROPOFOL N/A 06/28/2017   Procedure: COLONOSCOPY WITH PROPOFOL;  Surgeon: Lollie Sails, MD;  Location[facility] END[facility]COPY;  Service: Endoscopy;  Laterality: N/A;   ESOPHAGOGASTRODUODENOSCOPY (EGD) WITH PROPOFOL N/A 06/28/2017   Procedure: ESOPHAGOGASTRODUODENOSCOPY (EGD) WITH PROPOFOL;  Surgeon: Lollie Sails, MD;  Location: [facility] ENDOSCOPY;  Service: Endoscopy;  Laterality: N/A;   KNEE SURGERY Left    OVARY SURGERY Left    partial ovary and fallopian tube removed    OB History   No obstetric history on file.      Home Medications    Prior to Admission medications   Medication Sig Start Date End Date Taking? Authorizing Provider  albuterol (PROVENTIL HFA;VENTOLIN HFA) 108 (90  BASE) MCG/ACT inhaler Inhale 1-2 puffs into the lungs every 4 (four) hours as needed for wheezing or shortness of breath. 05/24/15  Yes Betancourt, Aura Fey, NP  beclomethasone (QVAR) 80 MCG/ACT inhaler Inhale 1 puff into the lungs 2 (two) times daily. 10/02/21  Yes Luvenia Redden, PA-C  montelukast (SINGULAIR) 10 MG tablet Take 1 tablet (10 mg total) by mouth at bedtime. 11/30/21  Yes Marney Setting, NP  predniSONE (STERAPRED UNI-PAK 21 TAB) 10 MG (21) TBPK tablet Take by mouth daily. Take 6 tabs by mouth daily  for 2 days, then 5 tabs for 2 days, then 4 tabs for 2 days, then 3 tabs for 2 days, 2 tabs for 2 days, then 1 tab by mouth daily for 2 days 11/30/21  Yes Marney Setting, NP  tiotropium (SPIRIVA HANDIHALER) 18 MCG inhalation capsule Place 1 capsule (18 mcg total) into inhaler and inhale daily. 11/30/21  Yes Marney Setting, NP  aspirin EC 81 MG tablet Take 81 mg by mouth daily. Swallow whole.    [provider]  spironolactone (ALDACTONE) 25 MG tablet Take by mouth. 12/27/18   [provider]  valACYclovir (VALTREX) 1000 MG tablet Take 1 tablet (1,000 mg total) by mouth 3 (three) times daily. 07/15/21   Coral Spikes, DO    Family History Family History  Problem Relation Age of Onset  Hypertension Mother    Colon cancer Maternal Uncle    Breast cancer Maternal Aunt        mat great aunt    Social History Social History   Tobacco Use   Smoking status: Every Day    Packs/day: 1.00    Types: Cigarettes   Smokeless tobacco: Never  Vaping Use   Vaping Use: Never used  Substance Use Topics   Alcohol use: Not Currently    Comment: occasionally   Drug use: Yes    Types: Marijuana     Allergies   Simvastatin, Statins, Lisinopril, Losartan, and Pravastatin   Review of Systems Review of Systems  Constitutional:  Negative for fever.  Respiratory:  Positive for shortness of breath and wheezing. Negative for cough.   Cardiovascular: Negative.    Gastrointestinal: Negative.   Genitourinary: Negative.   Neurological: Negative.     Physical Exam Triage Vital Signs ED Triage Vitals [11/30/21 1357]  Enc Vitals Group     BP (!) 164/89     Pulse Rate 74     Resp (!) 24     Temp 98.6 F (37 C)     Temp Source Oral     SpO2 (!) 88 %     Weight      Height      Head Circumference      Peak Flow      Pain Score      Pain Loc      Pain Edu?      Excl. in [place]?    No data found.  Updated Vital Signs BP (!) 164/89 (BP Location: Left Arm)   Pulse 74   Temp 98.6 F (37 C) (Oral)   Resp (!) 24   LMP 02/08/2015   SpO2 95%   Visual Acuity Right Eye Distance:   Left Eye Distance:   Bilateral Distance:    Right Eye Near:   Left Eye Near:    Bilateral Near:     Physical Exam   UC Treatments / Results  Labs (all labs ordered are listed, but only abnormal results are displayed) Labs Reviewed - No data to display  EKG   Radiology No results found.  Procedures Procedures (including critical care time)  Medications Ordered in UC Medications  methylPREDNISolone sodium succinate (SOLU-MEDROL) 125 mg/2 mL injection 125 mg (125 mg Intramuscular Given 11/30/21 1422)    Initial Impression / Assessment and Plan / UC Course  I have reviewed the triage vital signs and the nursing notes.  Pertinent labs & imaging results that were available during my care of the patient were reviewed by me and considered in my medical decision making (see chart for details).    Upon discharge on room air patient's O2 sats are 95% patient states that she feels much better not in any respiratory distress.  Patient states that she is at her baseline she feels like. Patient states that she feels better being on the oxygen and just needs steroids.  Patient is not willing to go to the emergency room. You will need to touch base with your primary care physician to see if they want to keep you on medications for long-term.   Final Clinical  Impressions(s) / UC Diagnoses   Final diagnoses:  COPD exacerbation ([place])  Wheezing  Shortness of breath     Discharge Instructions      You will need to follow-up with your primary care for long-term medication regimen. If symptoms become worse  you need to go to the emergency room. We will start you on Spiriva for you to take daily you can continue to take the albuterol inhaler as needed for shortness of breath or cough. We had given you an injection for steroids while you are here and will place you on a Dosepak for home treatment. You will need to take Singulair daily to prevent exacerbation or breathing difficulties.      ED Prescriptions     Medication Sig Dispense Auth. Provider   predniSONE (STERAPRED UNI-PAK 21 TAB) 10 MG (21) TBPK tablet Take by mouth daily. Take 6 tabs by mouth daily  for 2 days, then 5 tabs for 2 days, then 4 tabs for 2 days, then 3 tabs for 2 days, 2 tabs for 2 days, then 1 tab by mouth daily for 2 days 42 tablet Morley Kos L, NP   tiotropium (SPIRIVA HANDIHALER) 18 MCG inhalation capsule Place 1 capsule (18 mcg total) into inhaler and inhale daily. 30 capsule Morley Kos L, NP   montelukast (SINGULAIR) 10 MG tablet Take 1 tablet (10 mg total) by mouth at bedtime. 20 tablet Marney Setting, NP      PDMP not reviewed this encounter.   Marney Setting, NP 11/30/21 [phone]

## 2021-11-30 NOTE — ED Triage Notes (Signed)
Pt presents today with c/o of SOB with audible wheezing that began yesterday. She is an asthmatic and has used her Albuterol inhaler this morning. 02Sat 88% in triage, RR 24

## 2021-11-30 NOTE — Discharge Instructions (Addendum)
You will need to follow-up with your primary care for long-term medication regimen. If symptoms become worse you need to go to the emergency room. We will start you on Spiriva for you to take daily you can continue to take the albuterol inhaler as needed for shortness of breath or cough. We had given you an injection for steroids while you are here and will place you on a Dosepak for home treatment. You will need to take Singulair daily to prevent exacerbation or breathing difficulties.

## 2021-11-30 NOTE — ED Notes (Signed)
Patient placed on Oxygen at 2L Gloria Jones

## 2022-01-04 ENCOUNTER — Other Ambulatory Visit: Payer: Self-pay | Admitting: Specialist

## 2022-01-04 DIAGNOSIS — R0902 Hypoxemia: Secondary | ICD-10-CM

## 2022-01-04 DIAGNOSIS — J449 Chronic obstructive pulmonary disease, unspecified: Secondary | ICD-10-CM

## 2022-01-04 DIAGNOSIS — R0609 Other forms of dyspnea: Secondary | ICD-10-CM

## 2022-01-13 ENCOUNTER — Ambulatory Visit: Payer: BC Managed Care – PPO

## 2022-01-27 ENCOUNTER — Ambulatory Visit: Payer: BC Managed Care – PPO

## 2022-01-28 ENCOUNTER — Other Ambulatory Visit: Payer: Self-pay

## 2022-01-28 ENCOUNTER — Ambulatory Visit
Admission: RE | Admit: 2022-01-28 | Discharge: 2022-01-28 | Disposition: A | Discharge status: Home / Self Care | Payer: BC Managed Care – PPO | Source: Ambulatory Visit | Attending: Specialist | Admitting: Specialist

## 2022-01-28 DIAGNOSIS — R0902 Hypoxemia: Secondary | ICD-10-CM | POA: Diagnosis present

## 2022-01-28 DIAGNOSIS — J449 Chronic obstructive pulmonary disease, unspecified: Secondary | ICD-10-CM | POA: Insufficient documentation

## 2022-01-28 DIAGNOSIS — R0609 Other forms of dyspnea: Secondary | ICD-10-CM | POA: Insufficient documentation

## 2022-07-13 ENCOUNTER — Ambulatory Visit: Payer: Self-pay | Admitting: Urology

## 2022-07-20 ENCOUNTER — Other Ambulatory Visit: Payer: Self-pay | Admitting: *Deleted

## 2022-07-20 ENCOUNTER — Ambulatory Visit
Admission: RE | Admit: 2022-07-20 | Discharge: 2022-07-20 | Disposition: A | Discharge status: Home / Self Care | Payer: BC Managed Care – PPO | Attending: Urology | Admitting: Urology

## 2022-07-20 ENCOUNTER — Ambulatory Visit (INDEPENDENT_AMBULATORY_CARE_PROVIDER_SITE_OTHER): Payer: BC Managed Care – PPO | Admitting: Urology

## 2022-07-20 ENCOUNTER — Encounter: Payer: Self-pay | Admitting: Urology

## 2022-07-20 ENCOUNTER — Other Ambulatory Visit: Payer: Self-pay | Admitting: Urology

## 2022-07-20 ENCOUNTER — Ambulatory Visit
Admission: RE | Admit: 2022-07-20 | Discharge: 2022-07-20 | Disposition: A | Discharge status: Home / Self Care | Payer: BC Managed Care – PPO | Source: Ambulatory Visit | Attending: Urology | Admitting: Urology

## 2022-07-20 VITALS — BP 132/87 | HR 74 | Ht 63.0 in | Wt 206.6 lb

## 2022-07-20 DIAGNOSIS — N200 Calculus of kidney: Secondary | ICD-10-CM | POA: Insufficient documentation

## 2022-07-20 NOTE — Patient Instructions (Signed)
Laser Therapy for Kidney Stones Laser therapy for kidney stones is a procedure to break up small, hard mineral deposits that form in the kidney (kidney stones). The procedure is done using a device that produces a focused beam of light (laser). The laser breaks up kidney stones into pieces that are small enough to be passed out of the body through urination or removed from the body during the procedure. You may need laser therapy if you have kidney stones that are painful or block your urinary tract. This procedure is done by inserting a tube (ureteroscope) into your kidney through the urethral opening. The urethra is the part of the body that drains urine from the bladder. In women, the urethra opens above the vaginal opening. In men, the urethra opens at the tip of the penis. The ureteroscope is inserted through the urethra, and surgical instruments are moved through the bladder and the muscular tube that connects the kidney to the bladder (ureter) until they reach the kidney. Tell a health care provider about: Any allergies you have. All medicines you are taking, including vitamins, herbs, eye drops, creams, and over-the-counter medicines. Any problems you or family members have had with anesthetic medicines. Any blood disorders you have. Any surgeries you have had. Any medical conditions you have. Whether you are pregnant or may be pregnant. What are the risks? Generally, this is a safe procedure. However, problems may occur, including: Infection. Bleeding. Allergic reactions to medicines. Damage to the urethra, bladder, or ureter. Urinary tract infection (UTI). Narrowing of the urethra (urethral stricture). Difficulty passing urine. Blockage of the kidney caused by a fragment of kidney stone. What happens before the procedure? Medicines Ask your health care provider about: Changing or stopping your regular medicines. This is especially important if you are taking diabetes medicines or  blood thinners. Taking medicines such as aspirin and ibuprofen. These medicines can thin your blood. Do not take these medicines unless your health care provider tells you to take them. Taking over-the-counter medicines, vitamins, herbs, and supplements. Eating and drinking Follow instructions from your health care provider about eating and drinking, which may include: 8 hours before the procedure - stop eating heavy meals or foods, such as meat, fried foods, or fatty foods. 6 hours before the procedure - stop eating light meals or foods, such as toast or cereal. 6 hours before the procedure - stop drinking milk or drinks that contain milk. 2 hours before the procedure - stop drinking clear liquids. Staying hydrated Follow instructions from your health care provider about hydration, which may include: Up to 2 hours before the procedure - you may continue to drink clear liquids, such as water, clear fruit juice, black coffee, and plain tea.  General instructions You may have a physical exam before the procedure. You may also have tests, such as imaging tests and blood or urine tests. If your ureter is too narrow, your health care provider may place a soft, flexible tube (stent) inside of it. The stent may be placed days or weeks before your laser therapy procedure. Plan to have someone take you home from the hospital or clinic. If you will be going home right after the procedure, plan to have someone stay with you for 24 hours. Do not use any products that contain nicotine or tobacco for at least 4 weeks before the procedure. These products include cigarettes, e-cigarettes, and chewing tobacco. If you need help quitting, ask your health care provider. Ask your health care provider: How your   surgical site will be marked or identified. What steps will be taken to help prevent infection. These may include: Removing hair at the surgery site. Washing skin with a germ-killing soap. Taking antibiotic  medicine. What happens during the procedure?  An IV will be inserted into one of your veins. You will be given one or more of the following: A medicine to help you relax (sedative). A medicine to numb the area (local anesthetic). A medicine to make you fall asleep (general anesthetic). A ureteroscope will be inserted into your urethra. The ureteroscope will send images to a video screen in the operating room to guide your surgeon to the area of your kidney that will be treated. A small, flexible tube will be threaded through the ureteroscope and into your bladder and ureter, up to your kidney. The laser device will be inserted into your kidney through the tube. Your surgeon will pulse the laser on and off to break up kidney stones. A surgical instrument that has a tiny wire basket may be inserted through the tube into your kidney to remove the pieces of broken kidney stone. The procedure may vary among health care providers and hospitals. What happens after the procedure? Your blood pressure, heart rate, breathing rate, and blood oxygen level will be monitored until you leave the hospital or clinic. You will be given pain medicine as needed. You may continue to receive antibiotics. You may have a stent temporarily placed in your ureter. Do not drive for 24 hours if you were given a sedative during your procedure. You may be given a strainer to collect any stone fragments that you pass in your urine. Your health care provider may have these tested. Summary Laser therapy for kidney stones is a procedure to break up kidney stones into pieces that are small enough to be passed out of the body through urination or removed during the procedure. Follow instructions from your health care provider about eating and drinking before the procedure. During the procedure, the ureteroscope will send images to a video screen to guide your surgeon to the area of your kidney that will be treated. Do not drive  for 24 hours if you were given a sedative during your procedure. This information is not intended to replace advice given to you by your health care provider. Make sure you discuss any questions you have with your health care provider. Document Revised: 08/10/2021 Document Reviewed: 08/10/2021 Elsevier Patient Education  [place].  Kidney Stones Kidney stones are rock-like masses that form inside of the kidneys. Kidneys are organs that make pee (urine). A kidney stone may move into other parts of the urinary tract, including: The tubes that connect the kidneys to the bladder (ureters). The bladder. The tube that carries urine out of the body (urethra). Kidney stones can cause very bad pain and can block the flow of pee. The stone usually leaves your body (passes) through your pee. You may need to have a doctor take out the stone. What are the causes? Kidney stones may be caused by: A condition in which certain glands make too much parathyroid hormone (primary hyperparathyroidism). A buildup of a type of crystals in the bladder made of a chemical called uric acid. The body makes uric acid when you eat certain foods. Narrowing (stricture) of one or both of the ureters. A kidney blockage that you were born with. Past surgery on the kidney or the ureters, such as gastric bypass surgery. What increases the risk? You  are more likely to develop this condition if: You have had a kidney stone in the past. You have a family history of kidney stones. You do not drink enough water. You eat a diet that is high in protein, salt (sodium), or sugar. You are overweight or very overweight (obese). What are the signs or symptoms? Symptoms of a kidney stone may include: Pain in the side of the belly, right below the ribs (flank pain). Pain usually spreads (radiates) to the groin. Needing to pee often or right away (urgently). Pain when going pee (urinating). Blood in your pee  (hematuria). Feeling like you may vomit (nauseous). Vomiting. Fever and chills. How is this treated? Treatment depends on the size, location, and makeup of the kidney stones. The stones will often pass out of the body through peeing. You may need to: Drink more fluid to help pass the stone. In some cases, you may be given fluids through an IV tube put into one of your veins at the hospital. Take medicine for pain. Make changes in your diet to help keep kidney stones from coming back. Sometimes, medical procedures are needed to remove a kidney stone. This may involve: A procedure to break up kidney stones using a beam of light (laser) or shock waves. Surgery to remove the kidney stones. Follow these instructions at home: Medicines Take over-the-counter and prescription medicines only as told by your doctor. Ask your doctor if the medicine prescribed to you requires you to avoid driving or using heavy machinery. Eating and drinking Drink enough fluid to keep your pee pale yellow. You may be told to drink at least 8-10 glasses of water each day. This will help you pass the stone. If told by your doctor, change your diet. This may include: Limiting how much salt you eat. Eating more fruits and vegetables. Limiting how much meat, poultry, fish, and eggs you eat. Follow instructions from your doctor about eating or drinking restrictions. General instructions Collect pee samples as told by your doctor. You may need to collect a pee sample: 24 hours after a stone comes out. 8-12 weeks after a stone comes out, and every 6-12 months after that. Strain your pee every time you pee (urinate), for as long as told. Use the strainer that your doctor recommends. Do not throw out the stone. Keep it so that it can be tested by your doctor. Keep all follow-up visits as told by your doctor. This is important. You may need follow-up tests. How is this prevented? To prevent another kidney stone: Drink  enough fluid to keep your pee pale yellow. This is the best way to prevent kidney stones. Eat healthy foods. Avoid certain foods as told by your doctor. You may be told to eat less protein. Stay at a healthy weight. Where to find more information [place] [facility]): www.kidney.[facility]): www.urologyhealth.org Contact a doctor if: You have pain that gets worse or does not get better with medicine. Get help right away if: You have a fever or chills. You get very bad pain. You get new pain in your belly (abdomen). You pass out (faint). You cannot pee. Summary Kidney stones are rock-like masses that form inside of the kidneys. Kidney stones can cause very bad pain and can block the flow of pee. The stones will often pass out of the body through peeing. Drink enough fluid to keep your pee pale yellow. This information is not intended to replace advice given to you by  your health care provider. Make sure you discuss any questions you have with your health care provider. Document Revised: 08/10/2021 Document Reviewed: 08/10/2021 Elsevier Patient Education  [place].

## 2022-07-20 NOTE — Progress Notes (Signed)
   07/20/2022 11:20 AM   Gloria Jones [birth year] [id number]  Reason for visit: Follow up nephrolithiasis  HPI: 61 year old female who presented with gross hematuria in June 2021 and underwent CT and cystoscopy.  CT showed a 1.3 cm nonobstructing right lower pole stone, and cystoscopy was normal.  She opted for active surveillance.  She denies any UTIs over the last year, but has had some more low back pain and right-sided flank pain.  She is unsure if this is musculoskeletal or could be related to the stone.    I personally viewed and interpreted her KUB today that shows a stable 1.3 cm lower pole stone with no significant change from last year.  We discussed various treatment options for urolithiasis including observation with or without medical expulsive therapy, shockwave lithotripsy (SWL), ureteroscopy and laser lithotripsy with stent placement, and percutaneous nephrolithotomy.  We discussed that management is based on stone size, location, density, patient co-morbidities, and patient preference.   Stones <30m in size have a >80% spontaneous passage rate. Data surrounding the use of tamsulosin for medical expulsive therapy is controversial, but meta analyses suggests it is most efficacious for distal stones between 5-158min size. Possible side effects include dizziness/lightheadedness, and retrograde ejaculation.SWL has a lower stone free rate in a single procedure, but also a lower complication rate compared to ureteroscopy and avoids a stent and associated stent related symptoms. Possible complications include renal hematoma, steinstrasse, and need for additional treatment.Ureteroscopy with laser lithotripsy and stent placement has a higher stone free rate than SWL in a single procedure, however increased complication rate including possible infection, ureteral injury, bleeding, and stent related morbidity. Common stent related symptoms include dysuria, urgency/frequency, and flank  pain.PCNL is the favored treatment for stones >2cm. It involves a small incision in the flank, with complete fragmentation of stones and removal. It has the highest stone free rate, but also the highest complication rate. Possible complications include bleeding, infection/sepsis, injury to surrounding organs including the pleura, and collecting system injury.   We discussed at length that her stone does not appear to be obstructing based on CT and KUB results, but patients can have intermittent pain if they have intermittent obstruction from a stone that moves.  We discussed options at length, and I recommended considering ureteroscopy and laser lithotripsy, and she would like to schedule.  We discussed possible persistent low back or right-sided flank pain despite stone treatment.  Schedule right ureteroscopy, laser lithotripsy, stent placement  BrBilley CoMD  [id number] [id number]

## 2022-07-20 NOTE — Progress Notes (Unsigned)
Surgical Physician Order Form [facility]  * Scheduling expectation : Next Available patient preference  *Length of Case: 1 hour  *Clearance needed: no  *Anticoagulation Instructions: May continue all anticoagulants  *Aspirin Instructions: Ok to continue Aspirin  *Post-op visit Date/Instructions:   TBD  *Diagnosis: Right Nephrolithiasis  *Procedure: right Ureteroscopy w/laser lithotripsy & stent placement (63785)   Additional orders: N/A  -Admit type: OUTpatient  -Anesthesia: General  -VTE Prophylaxis Standing Order SCD's       Other:   -Standing Lab Orders Per Anesthesia    Lab other: UA&Urine Culture  -Standing Test orders EKG/Chest x-ray per Anesthesia       Test other:   - Medications:  Ancef 2gm IV  -Other orders:  N/A

## 2022-07-21 ENCOUNTER — Telehealth: Payer: Self-pay

## 2022-07-21 NOTE — Telephone Encounter (Signed)
I spoke with Gloria Jones. We have discussed possible surgery dates and Friday August 11th, 2023 was agreed upon by all parties. Patient given information about surgery date, what to expect pre-operatively and post operatively.  We discussed that a Pre-Admission Testing office will be calling to set up the pre-op visit that will take place prior to surgery, and that these appointments are typically done over the phone with a Pre-Admissions RN.  Informed patient that our office will communicate any additional care to be provided after surgery. Patients questions or concerns were discussed during our call. Advised to call our office should there be any additional information, questions or concerns that arise. Patient verbalized understanding.

## 2022-07-21 NOTE — Progress Notes (Signed)
[facility] Posting Form   Surgery Date/Time: Date: 07/30/2022  Surgeon: Dr. Nickolas Madrid, MD  Surgery Location: Day Surgery  Inpt ( No  )   Outpt (Yes)   Obs ( No  )   Diagnosis: N20.0 Right Nephrolithiasis  -CPT: 75301  Surgery: Right Ureteroscopy with laser lithotripsy and stent placement   Stop Anticoagulations: No, may continue ASA  Cardiac/Medical/Pulmonary Clearance needed: no  *Orders entered into EPIC  Date: 07/21/22   *Case booked in [place]  Date: 07/21/22  *Notified pt of Surgery: Date: 07/20/2022  PRE-OP UA & CX: yes, will obtain at [facility] lab on 07/21/2022  *Placed into Prior Authorization Work Fabio Bering Date: 07/21/22   Assistant/laser/rep:No

## 2022-07-26 ENCOUNTER — Encounter
Admission: RE | Admit: 2022-07-26 | Discharge: 2022-07-26 | Disposition: A | Discharge status: Home / Self Care | Payer: BC Managed Care – PPO | Source: Ambulatory Visit | Attending: Urology | Admitting: Urology

## 2022-07-26 VITALS — Ht 63.0 in | Wt 206.0 lb

## 2022-07-26 DIAGNOSIS — I10 Essential (primary) hypertension: Secondary | ICD-10-CM

## 2022-07-26 DIAGNOSIS — Z01812 Encounter for preprocedural laboratory examination: Secondary | ICD-10-CM

## 2022-07-26 HISTORY — DX: Prediabetes: R73.03

## 2022-07-26 HISTORY — DX: Personal history of urinary calculi: Z87.442

## 2022-07-26 NOTE — Patient Instructions (Addendum)
Your procedure is scheduled on: Friday July 30, 2022. Report to Day Surgery inside [place] 2nd floor, stop by admissions desk before getting on elevator.  To find out your arrival time please call [phone] between 1PM - 3PM on Thursday July 29, 2022.  Remember: Instructions that are not followed completely may result in serious medical risk,  up to and including death, or upon the discretion of your surgeon and anesthesiologist your  surgery may need to be rescheduled.     _X__ 1. Do not eat food or drink fluids after midnight the night before your procedure.                 No chewing gum or hard candies.   __X__2.  On the morning of surgery brush your teeth with toothpaste and water, you                may rinse your mouth with mouthwash if you wish.  Do not swallow any toothpaste or mouthwash.     _X__ 3.  No Alcohol for 24 hours before or after surgery.   _X__ 4.  Do Not Smoke or use e-cigarettes For 24 Hours Prior to Your Surgery.                 Do not use any chewable tobacco products for at least 6 hours prior to                 Surgery.  _X__  5.  Do not use any recreational drugs (marijuana, cocaine, heroin, ecstasy, MDMA or other)                For at least one week prior to your surgery.  Combination of these drugs with anesthesia                May have life threatening results.  ____  6.  Bring all medications with you on the day of surgery if instructed.   __X__  7.  Notify your doctor if there is any change in your medical condition      (cold, fever, infections).     Do not wear jewelry, make-up, hairpins, clips or nail polish. Do not wear lotions, powders, or perfumes. You may wear deodorant. Do not shave 48 hours prior to surgery.  Do not bring valuables to the hospital.    [facility] is not responsible for any belongings or valuables.  Contacts, dentures or bridgework may not be worn into surgery. Leave your suitcase in  the car. After surgery it may be brought to your room. For patients admitted to the hospital, discharge time is determined by your treatment team.   Patients discharged the day of surgery will not be allowed to drive home.   Make arrangements for someone to be with you for the first 24 hours of your Same Day Discharge.   __X__ Take these medicines the morning of surgery with A SIP OF WATER:    1. DULoxetine (CYMBALTA) 20 MG capsule  2.  3.   4.  5.  6.  ____ Fleet Enema (as directed)   ____ Use CHG Soap (or wipes) as directed  ____ Use Benzoyl Peroxide Gel as instructed  __X__ Use inhalers on the day of surgery  Fluticasone-Umeclidin-Vilant (TRELEGY ELLIPTA) 100-62.5-25 MCG/ACT AEPB  albuterol (PROVENTIL HFA;VENTOLIN HFA) 108 (90 BASE) MCG/ACT inhaler  ____ Stop metformin 2 days prior to surgery    ____ Take 1/2 of usual insulin  dose the night before surgery. No insulin the morning          of surgery.   ____ Call your PCP, cardiologist, or Pulmonologist if taking Coumadin/Plavix/aspirin and ask when to stop before your surgery.   __X__ One Week prior to surgery- Stop Anti-inflammatories such as Ibuprofen, Aleve, Advil, Motrin, meloxicam (MOBIC), diclofenac, etodolac, ketorolac, Toradol, Daypro, piroxicam, Goody's or BC powders. OK TO USE TYLENOL IF NEEDED   __X__ Stop supplements until after surgery.    ____ Bring C-Pap to the hospital.    If you have any questions regarding your pre-procedure instructions,  Please call Pre-admit Testing at [phone]

## 2022-07-27 ENCOUNTER — Inpatient Hospital Stay: Admission: RE | Admit: 2022-07-27 | Payer: BC Managed Care – PPO | Source: Ambulatory Visit

## 2022-07-28 ENCOUNTER — Encounter
Admission: RE | Admit: 2022-07-28 | Discharge: 2022-07-28 | Disposition: A | Discharge status: Home / Self Care | Payer: BC Managed Care – PPO | Source: Ambulatory Visit | Attending: Urology | Admitting: Urology

## 2022-07-28 DIAGNOSIS — J449 Chronic obstructive pulmonary disease, unspecified: Secondary | ICD-10-CM | POA: Diagnosis not present

## 2022-07-28 DIAGNOSIS — Z01818 Encounter for other preprocedural examination: Secondary | ICD-10-CM | POA: Insufficient documentation

## 2022-07-28 DIAGNOSIS — I10 Essential (primary) hypertension: Secondary | ICD-10-CM | POA: Diagnosis not present

## 2022-07-28 DIAGNOSIS — N200 Calculus of kidney: Secondary | ICD-10-CM | POA: Diagnosis present

## 2022-07-28 DIAGNOSIS — Z79899 Other long term (current) drug therapy: Secondary | ICD-10-CM | POA: Diagnosis not present

## 2022-07-28 DIAGNOSIS — Z01812 Encounter for preprocedural laboratory examination: Secondary | ICD-10-CM

## 2022-07-28 DIAGNOSIS — Z01810 Encounter for preprocedural cardiovascular examination: Secondary | ICD-10-CM | POA: Diagnosis not present

## 2022-07-28 LAB — URINALYSIS, ROUTINE W REFLEX MICROSCOPIC
Bacteria, UA: NONE SEEN
Bilirubin Urine: NEGATIVE
Glucose, UA: NEGATIVE mg/dL
Hgb urine dipstick: NEGATIVE
Ketones, ur: NEGATIVE mg/dL
Leukocytes,Ua: NEGATIVE
Nitrite: NEGATIVE
Protein, ur: 30 mg/dL — AB
Specific Gravity, Urine: 1.016 (ref 1.005–1.030)
pH: 7 (ref 5.0–8.0)

## 2022-07-28 LAB — CBC
HCT: 52.8 % — ABNORMAL HIGH (ref 36.0–46.0)
Hemoglobin: 16.8 g/dL — ABNORMAL HIGH (ref 12.0–15.0)
MCH: 30.7 pg (ref 26.0–34.0)
MCHC: 31.8 g/dL (ref 30.0–36.0)
MCV: 96.5 fL (ref 80.0–100.0)
Platelets: 235 10*3/uL (ref 150–400)
RBC: 5.47 MIL/uL — ABNORMAL HIGH (ref 3.87–5.11)
RDW: 12.3 % (ref 11.5–15.5)
WBC: 9.6 10*3/uL (ref 4.0–10.5)
nRBC: 0 % (ref 0.0–0.2)

## 2022-07-30 ENCOUNTER — Ambulatory Visit
Admission: RE | Admit: 2022-07-30 | Discharge: 2022-07-30 | Disposition: A | Discharge status: Home / Self Care | Payer: BC Managed Care – PPO | Attending: Urology | Admitting: Urology

## 2022-07-30 ENCOUNTER — Encounter: Payer: Self-pay | Admitting: Urology

## 2022-07-30 ENCOUNTER — Ambulatory Visit: Payer: BC Managed Care – PPO | Admitting: Urgent Care

## 2022-07-30 ENCOUNTER — Encounter
Admission: RE | Disposition: A | Discharge status: Home / Self Care | Payer: Self-pay | Source: Home / Self Care | Attending: Urology

## 2022-07-30 ENCOUNTER — Other Ambulatory Visit: Payer: Self-pay

## 2022-07-30 ENCOUNTER — Ambulatory Visit: Payer: BC Managed Care – PPO

## 2022-07-30 ENCOUNTER — Ambulatory Visit: Payer: BC Managed Care – PPO | Admitting: Anesthesiology

## 2022-07-30 DIAGNOSIS — N200 Calculus of kidney: Secondary | ICD-10-CM | POA: Insufficient documentation

## 2022-07-30 DIAGNOSIS — Z79899 Other long term (current) drug therapy: Secondary | ICD-10-CM | POA: Insufficient documentation

## 2022-07-30 DIAGNOSIS — I10 Essential (primary) hypertension: Secondary | ICD-10-CM | POA: Insufficient documentation

## 2022-07-30 DIAGNOSIS — J449 Chronic obstructive pulmonary disease, unspecified: Secondary | ICD-10-CM | POA: Insufficient documentation

## 2022-07-30 HISTORY — PX: CYSTOSCOPY/URETEROSCOPY/HOLMIUM LASER/STENT PLACEMENT: SHX6546

## 2022-07-30 LAB — URINE CULTURE: Special Requests: NORMAL

## 2022-07-30 SURGERY — CYSTOSCOPY/URETEROSCOPY/HOLMIUM LASER/STENT PLACEMENT
Anesthesia: General | Site: Ureter | Laterality: Right

## 2022-07-30 MED ORDER — CEFAZOLIN SODIUM-DEXTROSE 2-4 GM/100ML-% IV SOLN
INTRAVENOUS | Status: DC
Start: 2022-07-30 — End: 2022-07-30
  Filled 2022-07-30: qty 100

## 2022-07-30 MED ORDER — ROCURONIUM BROMIDE 100 MG/10ML IV SOLN
INTRAVENOUS | Status: DC | PRN
  Administered 2022-07-30: 50 mg via INTRAVENOUS

## 2022-07-30 MED ORDER — FLUMAZENIL 0.5 MG/5ML IV SOLN
INTRAVENOUS | Status: DC | PRN
  Administered 2022-07-30: .3 mg via INTRAVENOUS

## 2022-07-30 MED ORDER — ORAL CARE MOUTH RINSE: 15.0000 mL | Freq: Once | OROMUCOSAL | Status: DC

## 2022-07-30 MED ORDER — FENTANYL CITRATE (PF) 100 MCG/2ML IJ SOLN
INTRAMUSCULAR | Status: AC
  Filled 2022-07-30: qty 2

## 2022-07-30 MED ORDER — FAMOTIDINE 20 MG PO TABS: 20.0000 mg | ORAL_TABLET | Freq: Once | ORAL | Status: DC

## 2022-07-30 MED ORDER — CEFAZOLIN SODIUM-DEXTROSE 2-4 GM/100ML-% IV SOLN
2.0000 g | INTRAVENOUS | Status: AC
  Administered 2022-07-30: 2 g via INTRAVENOUS

## 2022-07-30 MED ORDER — SUGAMMADEX SODIUM 200 MG/2ML IV SOLN
INTRAVENOUS | Status: DC | PRN
  Administered 2022-07-30: 200 mg via INTRAVENOUS

## 2022-07-30 MED ORDER — PROPOFOL 10 MG/ML IV BOLUS
INTRAVENOUS | Status: AC
  Filled 2022-07-30: qty 20

## 2022-07-30 MED ORDER — MIDAZOLAM HCL 2 MG/2ML IJ SOLN
INTRAMUSCULAR | Status: DC | PRN
  Administered 2022-07-30: 2 mg via INTRAVENOUS

## 2022-07-30 MED ORDER — ACETAMINOPHEN 10 MG/ML IV SOLN
INTRAVENOUS | Status: DC | PRN
  Administered 2022-07-30: 1000 mg via INTRAVENOUS

## 2022-07-30 MED ORDER — DEXAMETHASONE SODIUM PHOSPHATE 10 MG/ML IJ SOLN
INTRAMUSCULAR | Status: AC
  Filled 2022-07-30: qty 1

## 2022-07-30 MED ORDER — DROPERIDOL 2.5 MG/ML IJ SOLN: 0.6250 mg | Freq: Once | INTRAMUSCULAR | Status: DC | PRN

## 2022-07-30 MED ORDER — OXYCODONE HCL 5 MG PO TABS
5.0000 mg | ORAL_TABLET | Freq: Once | ORAL | Status: AC | PRN
  Administered 2022-07-30: 5 mg via ORAL

## 2022-07-30 MED ORDER — LACTATED RINGERS IV SOLN: INTRAVENOUS | Status: DC

## 2022-07-30 MED ORDER — ONDANSETRON HCL 4 MG/2ML IJ SOLN
INTRAMUSCULAR | Status: AC
  Filled 2022-07-30: qty 2

## 2022-07-30 MED ORDER — ACETAMINOPHEN 10 MG/ML IV SOLN
INTRAVENOUS | Status: AC
  Filled 2022-07-30: qty 100

## 2022-07-30 MED ORDER — OXYCODONE HCL 5 MG/5ML PO SOLN: 5.0000 mg | Freq: Once | ORAL | Status: AC | PRN

## 2022-07-30 MED ORDER — CHLORHEXIDINE GLUCONATE 0.12 % MT SOLN: 15.0000 mL | Freq: Once | OROMUCOSAL | Status: DC

## 2022-07-30 MED ORDER — PROMETHAZINE HCL 25 MG/ML IJ SOLN: 6.2500 mg | INTRAMUSCULAR | Status: DC | PRN

## 2022-07-30 MED ORDER — ROCURONIUM BROMIDE 10 MG/ML (PF) SYRINGE
PREFILLED_SYRINGE | INTRAVENOUS | Status: AC
  Filled 2022-07-30: qty 10

## 2022-07-30 MED ORDER — PHENYLEPHRINE HCL (PRESSORS) 10 MG/ML IV SOLN
INTRAVENOUS | Status: DC | PRN
  Administered 2022-07-30: 80 ug via INTRAVENOUS

## 2022-07-30 MED ORDER — NITROFURANTOIN MACROCRYSTAL 100 MG PO CAPS: 100.0000 mg | ORAL_CAPSULE | Freq: Every day | ORAL | 0 refills | Status: AC

## 2022-07-30 MED ORDER — DEXAMETHASONE SODIUM PHOSPHATE 10 MG/ML IJ SOLN
INTRAMUSCULAR | Status: DC | PRN
  Administered 2022-07-30: 5 mg via INTRAVENOUS

## 2022-07-30 MED ORDER — HYDROCODONE-ACETAMINOPHEN 5-325 MG PO TABS: 1.0000 | ORAL_TABLET | Freq: Four times a day (QID) | ORAL | 0 refills | Status: AC | PRN

## 2022-07-30 MED ORDER — LIDOCAINE HCL (CARDIAC) PF 100 MG/5ML IV SOSY
PREFILLED_SYRINGE | INTRAVENOUS | Status: DC | PRN
  Administered 2022-07-30: 100 mg via INTRAVENOUS

## 2022-07-30 MED ORDER — OXYCODONE HCL 5 MG PO TABS
ORAL_TABLET | ORAL | Status: AC
  Filled 2022-07-30: qty 1

## 2022-07-30 MED ORDER — ONDANSETRON HCL 4 MG/2ML IJ SOLN
INTRAMUSCULAR | Status: DC | PRN
  Administered 2022-07-30: 4 mg via INTRAVENOUS

## 2022-07-30 MED ORDER — FENTANYL CITRATE (PF) 100 MCG/2ML IJ SOLN: 25.0000 ug | INTRAMUSCULAR | Status: DC | PRN

## 2022-07-30 MED ORDER — ACETAMINOPHEN 10 MG/ML IV SOLN: 1000.0000 mg | Freq: Once | INTRAVENOUS | Status: DC | PRN

## 2022-07-30 MED ORDER — PROPOFOL 10 MG/ML IV BOLUS
INTRAVENOUS | Status: DC | PRN
  Administered 2022-07-30: 50 mg via INTRAVENOUS
  Administered 2022-07-30: 150 mg via INTRAVENOUS

## 2022-07-30 MED ORDER — MIDAZOLAM HCL 2 MG/2ML IJ SOLN
INTRAMUSCULAR | Status: AC
  Filled 2022-07-30: qty 2

## 2022-07-30 MED ORDER — FLUMAZENIL 0.5 MG/5ML IV SOLN
INTRAVENOUS | Status: AC
  Filled 2022-07-30: qty 5

## 2022-07-30 MED ORDER — ACETAMINOPHEN 500 MG PO TABS: 1000.0000 mg | ORAL_TABLET | Freq: Once | ORAL | Status: DC

## 2022-07-30 MED ORDER — NALOXONE HCL 0.4 MG/ML IJ SOLN
INTRAMUSCULAR | Status: AC
  Filled 2022-07-30: qty 1

## 2022-07-30 MED ORDER — CHLORHEXIDINE GLUCONATE 0.12 % MT SOLN
OROMUCOSAL | Status: AC
  Filled 2022-07-30: qty 15

## 2022-07-30 MED ORDER — FENTANYL CITRATE (PF) 100 MCG/2ML IJ SOLN
INTRAMUSCULAR | Status: DC | PRN
Start: 2022-07-30 — End: 2022-07-30
  Administered 2022-07-30 (×2): 50 ug via INTRAVENOUS

## 2022-07-30 MED ORDER — OXYBUTYNIN CHLORIDE ER 10 MG PO TB24: 10.0000 mg | ORAL_TABLET | Freq: Every day | ORAL | 0 refills | Status: AC | PRN

## 2022-07-30 SURGICAL SUPPLY — 30 items
ADHESIVE MASTISOL STRL (MISCELLANEOUS) IMPLANT
BAG DRAIN SIEMENS DORNER NS (MISCELLANEOUS) ×2 IMPLANT
BAG PRESSURE INF REUSE 3000 (BAG) ×2 IMPLANT
BRUSH SCRUB EZ 1% IODOPHOR (MISCELLANEOUS) ×2 IMPLANT
CATH URET FLEX-TIP 2 LUMEN 10F (CATHETERS) ×1 IMPLANT
CATH URETL OPEN 5X70 (CATHETERS) ×1 IMPLANT
CNTNR SPEC 2.5X3XGRAD LEK (MISCELLANEOUS)
CONT SPEC 4OZ STER OR WHT (MISCELLANEOUS)
CONTAINER SPEC 2.5X3XGRAD LEK (MISCELLANEOUS) IMPLANT
DRAPE UTILITY 15X26 TOWEL STRL (DRAPES) ×2 IMPLANT
DRSG TEGADERM 2-3/8X2-3/4 SM (GAUZE/BANDAGES/DRESSINGS) IMPLANT
GLOVE SURG UNDER POLY LF SZ7.5 (GLOVE) ×2 IMPLANT
GOWN STRL REUS W/ TWL LRG LVL3 (GOWN DISPOSABLE) ×1 IMPLANT
GOWN STRL REUS W/ TWL XL LVL3 (GOWN DISPOSABLE) ×1 IMPLANT
GOWN STRL REUS W/TWL LRG LVL3 (GOWN DISPOSABLE) ×1
GOWN STRL REUS W/TWL XL LVL3 (GOWN DISPOSABLE) ×1
GUIDEWIRE STR DUAL SENSOR (WIRE) ×3 IMPLANT
IV NS IRRIG 3000ML ARTHROMATIC (IV SOLUTION) ×2 IMPLANT
KIT TURNOVER CYSTO (KITS) ×2 IMPLANT
PACK CYSTO AR (MISCELLANEOUS) ×2 IMPLANT
SET CYSTO W/LG BORE CLAMP LF (SET/KITS/TRAYS/PACK) ×2 IMPLANT
SHEATH NAVIGATOR HD 12/14X36 (SHEATH) IMPLANT
STENT URET 6FRX24 CONTOUR (STENTS) ×1 IMPLANT
STENT URET 6FRX26 CONTOUR (STENTS) IMPLANT
SURGILUBE 2OZ TUBE FLIPTOP (MISCELLANEOUS) ×2 IMPLANT
SYR 10ML LL (SYRINGE) ×2 IMPLANT
TRACTIP FLEXIVA PULSE ID 200 (Laser) ×1 IMPLANT
TRAP FLUID SMOKE EVACUATOR (MISCELLANEOUS) ×2 IMPLANT
VALVE UROSEAL ADJ ENDO (VALVE) IMPLANT
WATER STERILE IRR 500ML POUR (IV SOLUTION) ×2 IMPLANT

## 2022-07-30 NOTE — Transfer of Care (Signed)
Immediate Anesthesia Transfer of Care Note  Patient: Gloria Jones  Procedure(s) Performed: CYSTOSCOPY/URETEROSCOPY/HOLMIUM LASER/STENT PLACEMENT (Right: Ureter)  Patient Location: PACU  Anesthesia Type:General  Level of Consciousness: drowsy and patient cooperative  Airway & Oxygen Therapy: Patient Spontanous Breathing and Patient connected to face mask oxygen  Post-op Assessment: Report given to RN and Post -op Vital signs reviewed and stable  Post vital signs: Reviewed and stable  Last Vitals:  Vitals Value Taken Time  BP 137/88   Temp    Pulse 79 07/30/22 1659  Resp 24 07/30/22 1659  SpO2 94 % 07/30/22 1659  Vitals shown include unvalidated device data.  Last Pain:  Vitals:   07/30/22 1214  TempSrc: Temporal  PainSc: 8          Complications: No notable events documented.

## 2022-07-30 NOTE — Op Note (Signed)
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 Uncomplicated dusting of right lower pole stones and stent placement  EBL: Minimal  Specimens: None  Drains: Right 6 French by 24 cm ureteral stent  Indication: Gloria Jones is a 61 y.o. patient with right renal stones and intermittent flank pain who opted for ureteroscopy.  After reviewing the management options for treatment, they elected to proceed with the above surgical procedure(s). We have discussed the potential benefits and risks of the procedure, side effects of the proposed treatment, the likelihood of the patient achieving the goals of the procedure, and any potential problems that might occur during the procedure or recuperation. Informed consent has been obtained.  Description of procedure:  The patient was taken to the operating room and general anesthesia was induced. SCDs were placed for DVT prophylaxis. The patient was placed in the dorsal lithotomy position, prepped and draped in the usual sterile fashion, and preoperative antibiotics were administered. A preoperative time-out was performed.   A 21 French rigid cystoscope was used to intubate the urethra and thorough cystoscopy was performed.  The bladder was grossly normal, and ureteral orifices were orthotopic bilaterally.  There was a small amount of erythema and what appeared to be  potentially a duplicated system with a second ureter lateral and superior to the right-sided ureteral orifice and I attempted to cannulate this with a sensor wire to perform a retrograde but was unable to.  This was extremely small and may have just represented a subtle abnormality within the bladder, but did not appear papillary or malignant.  A retrograde pyelogram was performed through the obvious right-sided ureter and showed a normal collecting system with the stone located in the lower pole.  A sensor wire advanced easily into the right ureteral orifice up to the kidney under fluoroscopic vision.  A dual-lumen ureteral access catheter was advanced over the wire to the proximal ureter and a second safety sensor wire was added.  A digital single-channel flexible ureteroscope advanced easily over the wire up into the kidney.  Thorough pyeloscopy revealed a 1 cm and 5 mm stone in the right lower pole.  A 200 m laser fiber on settings of 0.6 J and 80 Hz was used to methodically dust the stones to fragments smaller than the laser fiber.  Thorough pyeloscopy showed no evidence of residual stone fragments.  A retrograde pyelogram was performed from the proximal ureter and showed no extravasation or filling defects.  Careful pullback ureteroscopy showed no ureteral injury or residual stones.  A rigid cystoscope was backloaded over the wire and a 6 [place] by 24 cm ureteral stent was uneventfully placed with an excellent curl in the kidney as well as in the bladder.  The bladder was drained and this concluded the procedure.  Disposition: Stable to PACU  Plan: Stent removal in clinic in 1 to 2 weeks  Nickolas Madrid, MD

## 2022-07-30 NOTE — Anesthesia Preprocedure Evaluation (Addendum)
Anesthesia Evaluation  Patient identified by MRN, date of birth, ID band Patient awake    Reviewed: Allergy & Precautions, NPO status , Patient's Chart, lab work & pertinent test results  Airway Mallampati: II  TM Distance: >3 FB Neck ROM: full    Dental no notable dental hx.    Pulmonary asthma , COPD,  COPD inhaler, Current SmokerPatient did not abstain from smoking.,    Pulmonary exam normal        Cardiovascular Exercise Tolerance: Good hypertension, Pt. on medications Normal cardiovascular exam     Neuro/Psych PSYCHIATRIC DISORDERS Anxiety Depression negative neurological ROS     GI/Hepatic negative GI ROS, (+)     substance abuse  marijuana use,   Endo/Other  Prediabetes  Renal/GU Renal diseaseRight Nephrolithiasis     Musculoskeletal   Abdominal (+) + obese,   Peds  Hematology negative hematology ROS (+)   Anesthesia Other Findings Past Medical History: No date: Allergic state No date: Asthma No date: Bronchitis No date: COPD (chronic obstructive pulmonary disease) (HCC) No date: History of kidney stones No date: Hypertension No date: Pre-diabetes  Past Surgical History: 06/28/2017: COLONOSCOPY WITH PROPOFOL; N/A     Comment:  Procedure: COLONOSCOPY WITH PROPOFOL;  Surgeon:               Lollie Sails, MD;  Location: [facility] ENDOSCOPY;                Service: Endoscopy;  Laterality: N/A; 06/28/2017: ESOPHAGOGASTRODUODENOSCOPY (EGD) WITH PROPOFOL; N/A     Comment:  Procedure: ESOPHAGOGASTRODUODENOSCOPY (EGD) WITH               PROPOFOL;  Surgeon: Lollie Sails, MD;  Location:               [facility] ENDOSCOPY;  Service: Endoscopy;  Laterality: N/A; No date: KNEE SURGERY; Left     Comment:  meniscus repair 12/1977: OVARY SURGERY; Left     Comment:  partial ovary and fallopian tube removed  BMI    Body Mass Index: 36.48 kg/m      Reproductive/Obstetrics negative OB ROS                             Anesthesia Physical Anesthesia Plan  ASA: 3  Anesthesia Plan: General ETT   Post-op Pain Management: Tylenol PO (pre-op)* and Dilaudid IV   Induction: Intravenous  PONV Risk Score and Plan: 3 and Ondansetron, Dexamethasone and Midazolam  Airway Management Planned: Oral ETT  Additional Equipment:   Intra-op Plan:   Post-operative Plan: Extubation in OR  Informed Consent: I have reviewed the patients History and Physical, chart, labs and discussed the procedure including the risks, benefits and alternatives for the proposed anesthesia with the patient or authorized representative who has indicated his/her understanding and acceptance.     Dental Advisory Given  Plan Discussed with: Anesthesiologist, CRNA and Surgeon  Anesthesia Plan Comments:        Anesthesia Quick Evaluation

## 2022-07-30 NOTE — Discharge Instructions (Signed)
AMBULATORY SURGERY  ?DISCHARGE INSTRUCTIONS ? ? ?The drugs that you were given will stay in your system until tomorrow so for the next 24 hours you should not: ? ?Drive an automobile ?Make any legal decisions ?Drink any alcoholic beverage ? ? ?You may resume regular meals tomorrow.  Today it is better to start with liquids and gradually work up to solid foods. ? ?You may eat anything you prefer, but it is better to start with liquids, then soup and crackers, and gradually work up to solid foods. ? ? ?Please notify your doctor immediately if you have any unusual bleeding, trouble breathing, redness and pain at the surgery site, drainage, fever, or pain not relieved by medication. ? ? ? ?Additional Instructions: ? ? ? ?Please contact your physician with any problems or Same Day Surgery at [phone], Monday through Friday 6 am to 4 pm, or [place] [facility] [place] [facility] number at [phone].  ?

## 2022-07-30 NOTE — Anesthesia Postprocedure Evaluation (Signed)
Anesthesia Post Note  Patient: Gloria Jones  Procedure(s) Performed: CYSTOSCOPY/URETEROSCOPY/HOLMIUM LASER/STENT PLACEMENT (Right: Ureter)  Patient location during evaluation: PACU Anesthesia Type: General Level of consciousness: awake and alert Pain management: pain level controlled Vital Signs Assessment: post-procedure vital signs reviewed and stable Respiratory status: spontaneous breathing, nonlabored ventilation, respiratory function stable and patient connected to nasal cannula oxygen Cardiovascular status: blood pressure returned to baseline and stable Postop Assessment: no apparent nausea or vomiting Anesthetic complications: no   No notable events documented.   Last Vitals:  Vitals:   07/30/22 1700 07/30/22 1715  BP: 137/88 122/87  Pulse: 79 80  Resp: (!) 24 16  Temp: (!) 36.2 C   SpO2: 94% 96%    Last Pain:  Vitals:   07/30/22 1715  TempSrc:   PainSc: 8                  Precious Haws Alyn Jurney

## 2022-07-30 NOTE — Anesthesia Procedure Notes (Signed)
Procedure Name: Intubation Date/Time: 07/30/2022 4:02 PM  Performed by: Cammie Sickle, CRNAPre-anesthesia Checklist: Patient identified, Patient being monitored, Timeout performed, Emergency Drugs available and Suction available Patient Re-evaluated:Patient Re-evaluated prior to induction Oxygen Delivery Method: Circle system utilized Preoxygenation: Pre-oxygenation with 100% oxygen Induction Type: IV induction Ventilation: Mask ventilation without difficulty Laryngoscope Size: 3 and McGraph Grade View: Grade I Tube type: Oral Tube size: 7.0 mm Number of attempts: 1 Airway Equipment and Method: Stylet Placement Confirmation: ETT inserted through vocal cords under direct vision, positive ETCO2 and breath sounds checked- equal and bilateral Secured at: 21 cm Tube secured with: Tape Dental Injury: Teeth and Oropharynx as per pre-operative assessment

## 2022-07-30 NOTE — H&P (Signed)
   07/30/22 3:41 PM   Gloria Jones February 10, 1961 [id number]  CC: Right renal stone  HPI: 61 year old female with known 1.3 cm right renal stone.  She initially opted for surveillance, but has some intermittent right-sided flank pain that she attributes to the stone.  We discussed that this may be from intermittent obstruction but could also be musculoskeletal and unrelated to the stone.  Using shared decision making she opted for ureteroscopy.   PMH: Past Medical History:  Diagnosis Date   Allergic state    Asthma    Bronchitis    COPD (chronic obstructive pulmonary disease) ([place])    History of kidney stones    Hypertension    Pre-diabetes     Surgical History: Past Surgical History:  Procedure Laterality Date   COLONOSCOPY WITH PROPOFOL N/A 06/28/2017   Procedure: COLONOSCOPY WITH PROPOFOL;  Surgeon: Lollie Sails, MD;  Location: [facility] ENDOSCOPY;  Service: Endoscopy;  Laterality: N/A;   ESOPHAGOGASTRODUODENOSCOPY (EGD) WITH PROPOFOL N/A 06/28/2017   Procedure: ESOPHAGOGASTRODUODENOSCOPY (EGD) WITH PROPOFOL;  Surgeon: Lollie Sails, MD;  Location: [facility] ENDOSCOPY;  Service: Endoscopy;  Laterality: N/A;   KNEE SURGERY Left    meniscus repair   OVARY SURGERY Left 12/1977   partial ovary and fallopian tube removed      Family History: Family History  Problem Relation Age of Onset   Hypertension Mother    Colon cancer Maternal Uncle    Breast cancer Maternal Aunt        mat great aunt    Social History:  reports that she has been smoking cigarettes. She has been smoking an average of 1 pack per day. She has never used smokeless tobacco. She reports that she does not currently use alcohol. She reports current drug use. Drug: Marijuana.  Physical Exam: BP 127/84   Pulse 81   Temp 98.1 F (36.7 C) (Temporal)   Resp 16   Ht '5\' 3"'$  (1.6 m)   Wt 93.4 kg   LMP 02/08/2015   SpO2 91%   BMI 36.48 kg/m    Constitutional:  Alert and oriented, No acute  distress. Cardiovascular: Regular rate and rhythm Respiratory: Clear to auscultation bilaterally GI: Abdomen is soft, nontender, nondistended, no abdominal masses   Laboratory Data: Urinalysis 07/28/2022 completely benign, mixed flora   Assessment & Plan:   61 year old female with intermittent right-sided flank pain and a 1.3 cm renal stone who opted for ureteroscopy.  Risk, benefits, and alternatives including surveillance discussed at length.  We specifically discussed the risks ureteroscopy including bleeding, infection/sepsis, stent related symptoms including flank pain/urgency/frequency/incontinence/dysuria, ureteral injury, inability to access stone, or need for staged or additional procedures.  Right ureteroscopy, laser lithotripsy, stent placement  Nickolas Madrid, MD 07/30/2022  [place] [address] [place],  [address] [phone]

## 2022-07-31 ENCOUNTER — Encounter: Payer: Self-pay | Admitting: Urology

## 2022-08-02 ENCOUNTER — Telehealth: Payer: Self-pay

## 2022-08-02 NOTE — Telephone Encounter (Signed)
Patient called in asking for a work note to cover her after her surgery on 08/11 until she has her stent removed on 08/22. We talked about stent discomfort and encouraged patient to take medication and to call our office with any worsening symptoms. Work Note provided and sent through [employer].

## 2022-08-05 ENCOUNTER — Telehealth: Payer: Self-pay | Admitting: *Deleted

## 2022-08-05 NOTE — Telephone Encounter (Signed)
Will fill out once received. No forms have been received yet.

## 2022-08-05 NOTE — Telephone Encounter (Signed)
Patient called requesting once paperwork is received to fill our for her job to attach return to work note to forms as well.

## 2022-08-06 ENCOUNTER — Other Ambulatory Visit: Payer: Self-pay | Admitting: Urology

## 2022-08-10 ENCOUNTER — Ambulatory Visit (INDEPENDENT_AMBULATORY_CARE_PROVIDER_SITE_OTHER): Payer: BC Managed Care – PPO | Admitting: Urology

## 2022-08-10 ENCOUNTER — Other Ambulatory Visit: Payer: Self-pay | Admitting: *Deleted

## 2022-08-10 ENCOUNTER — Other Ambulatory Visit
Admission: RE | Admit: 2022-08-10 | Discharge: 2022-08-10 | Disposition: A | Discharge status: Home / Self Care | Payer: BC Managed Care – PPO | Attending: Urology | Admitting: Urology

## 2022-08-10 ENCOUNTER — Encounter: Payer: Self-pay | Admitting: Urology

## 2022-08-10 VITALS — BP 105/69 | HR 91 | Ht 62.0 in | Wt 203.0 lb

## 2022-08-10 DIAGNOSIS — N200 Calculus of kidney: Secondary | ICD-10-CM | POA: Insufficient documentation

## 2022-08-10 LAB — URINALYSIS, COMPLETE (UACMP) WITH MICROSCOPIC
Bilirubin Urine: NEGATIVE
Glucose, UA: NEGATIVE mg/dL
Ketones, ur: NEGATIVE mg/dL
Nitrite: NEGATIVE
Protein, ur: 100 mg/dL — AB
Specific Gravity, Urine: 1.02 (ref 1.005–1.030)
pH: 5.5 (ref 5.0–8.0)

## 2022-08-10 MED ORDER — CEPHALEXIN 250 MG PO CAPS
500.0000 mg | ORAL_CAPSULE | Freq: Once | ORAL | Status: AC
  Administered 2022-08-10: 500 mg via ORAL

## 2022-08-10 NOTE — Progress Notes (Signed)
Cystoscopy Procedure Note:  Indication: Stent removal s/p 07/30/22 Right URS/LL/Stent for symptomatic 1cm renal stone  Keflex given for prophylaxis  After informed consent and discussion of the procedure and its risks, Gloria Jones was positioned and prepped in the standard fashion. Cystoscopy was performed with a flexible cystoscope. The stent was grasped with flexible graspers and removed in its entirety. The patient tolerated the procedure well.  Findings: Uncomplicated stent removal  Assessment and Plan: RTC 1 year KUB  Billey Co, MD 08/10/2022

## 2022-08-11 LAB — URINE CULTURE: Culture: NO GROWTH

## 2022-09-10 ENCOUNTER — Ambulatory Visit
Admission: EM | Admit: 2022-09-10 | Discharge: 2022-09-10 | Disposition: A | Discharge status: Home / Self Care | Payer: BC Managed Care – PPO | Attending: Emergency Medicine | Admitting: Emergency Medicine

## 2022-09-10 DIAGNOSIS — Z79899 Other long term (current) drug therapy: Secondary | ICD-10-CM | POA: Diagnosis not present

## 2022-09-10 DIAGNOSIS — J069 Acute upper respiratory infection, unspecified: Secondary | ICD-10-CM | POA: Diagnosis not present

## 2022-09-10 DIAGNOSIS — R059 Cough, unspecified: Secondary | ICD-10-CM | POA: Insufficient documentation

## 2022-09-10 DIAGNOSIS — Z7952 Long term (current) use of systemic steroids: Secondary | ICD-10-CM | POA: Diagnosis not present

## 2022-09-10 DIAGNOSIS — Z20822 Contact with and (suspected) exposure to covid-19: Secondary | ICD-10-CM | POA: Insufficient documentation

## 2022-09-10 LAB — SARS CORONAVIRUS 2 BY RT PCR: SARS Coronavirus 2 by RT PCR: NEGATIVE

## 2022-09-10 MED ORDER — PROMETHAZINE-DM 6.25-15 MG/5ML PO SYRP: 5.0000 mL | ORAL_SOLUTION | Freq: Four times a day (QID) | ORAL | 0 refills | Status: DC | PRN

## 2022-09-10 MED ORDER — IPRATROPIUM BROMIDE 0.06 % NA SOLN: 2.0000 | Freq: Four times a day (QID) | NASAL | 12 refills | Status: DC

## 2022-09-10 MED ORDER — PREDNISONE 20 MG PO TABS: 60.0000 mg | ORAL_TABLET | Freq: Every day | ORAL | 0 refills | Status: AC

## 2022-09-10 MED ORDER — BENZONATATE 100 MG PO CAPS: 200.0000 mg | ORAL_CAPSULE | Freq: Three times a day (TID) | ORAL | 0 refills | Status: DC

## 2022-09-10 NOTE — Discharge Instructions (Addendum)
Your test today was negative for COVID but do believe you have a viral respiratory infection.  Use the Atrovent nasal spray, 2 squirts in each nostril every 6 hours, as needed for runny nose and postnasal drip.  Use the Tessalon Perles every 8 hours during the day.  Take them with a small sip of water.  They may give you some numbness to the base of your tongue or a metallic taste in your mouth, this is normal.  Use the Promethazine DM cough syrup at bedtime for cough and congestion.  It will make you drowsy so do not take it during the day.  Start the prednisone tomorrow morning.  You will take 60 mg each morning with breakfast for 5 days to decrease pulm inflammation.  Return for reevaluation or see your primary care provider for any new or worsening symptoms.

## 2022-09-10 NOTE — ED Provider Notes (Signed)
[facility]    CSN: [id number] Arrival date & time: 09/10/22  1336      History   Chief Complaint Chief Complaint  Patient presents with   Headache    HPI Gloria Jones is a 61 y.o. female.   HPI  61 year old female here for evaluation of respiratory complaints and headache.  Patient reports that she recently traveled to [place] and upon returning she has been experiencing a headache, nasal congestion with occasional nasal discharge, and a nonproductive cough.  She denies any fever, sore throat, shortness breath, wheezing, or GI complaints.  Past Medical History:  Diagnosis Date   Allergic state    Asthma    Bronchitis    COPD (chronic obstructive pulmonary disease) ([place])    History of kidney stones    Hypertension    Pre-diabetes     Patient Active Problem List   Diagnosis Date Noted   COPD (chronic obstructive pulmonary disease) ([place]) 12/22/2018    Past Surgical History:  Procedure Laterality Date   COLONOSCOPY WITH PROPOFOL N/A 06/28/2017   Procedure: COLONOSCOPY WITH PROPOFOL;  Surgeon: Lollie Sails, MD;  Location: [facility] ENDOSCOPY;  Service: Endoscopy;  Laterality: N/A;   CYSTOSCOPY/URETEROSCOPY/HOLMIUM LASER/STENT PLACEMENT Right 07/30/2022   Procedure: CYSTOSCOPY/URETEROSCOPY/HOLMIUM LASER/STENT PLACEMENT;  Surgeon: Billey Co, MD;  Location: [facility] ORS;  Service: Urology;  Laterality: Right;   ESOPHAGOGASTRODUODENOSCOPY (EGD) WITH PROPOFOL N/A 06/28/2017   Procedure: ESOPHAGOGASTRODUODENOSCOPY (EGD) WITH PROPOFOL;  Surgeon: Lollie Sails, MD;  Location: [facility] ENDOSCOPY;  Service: Endoscopy;  Laterality: N/A;   KNEE SURGERY Left    meniscus repair   OVARY SURGERY Left 12/1977   partial ovary and fallopian tube removed    OB History   No obstetric history on file.      Home Medications    Prior to Admission medications   Medication Sig Start Date End Date Taking? Authorizing Provider  albuterol (PROVENTIL HFA;VENTOLIN  HFA) 108 (90 BASE) MCG/ACT inhaler Inhale 1-2 puffs into the lungs every 4 (four) hours as needed for wheezing or shortness of breath. 05/24/15  Yes Betancourt, Aura Fey, NP  aspirin EC 81 MG tablet Take 81 mg by mouth daily. Swallow whole.   Yes [provider]  benzonatate (TESSALON) 100 MG capsule Take 2 capsules (200 mg total) by mouth every 8 (eight) hours. 09/10/22  Yes Margarette Canada, NP  Cyanocobalamin (VITAMIN B12) 1000 MCG TBCR Take by mouth.   Yes [provider]  DULoxetine (CYMBALTA) 20 MG capsule TAKE ONE CAPSULE BY MOUTH THREE TIMES DAILY WITH FOOD 07/02/22  Yes [provider]  ezetimibe (ZETIA) 10 MG tablet Take 1 tablet by mouth daily. 07/06/22 07/06/23 Yes [provider]  Fluticasone-Umeclidin-Vilant (TRELEGY ELLIPTA) 100-62.5-25 MCG/ACT AEPB Inhale into the lungs. 02/01/22  Yes [provider]  ipratropium (ATROVENT) 0.06 % nasal spray Place 2 sprays into both nostrils 4 (four) times daily. 09/10/22  Yes Margarette Canada, NP  predniSONE (DELTASONE) 20 MG tablet Take 3 tablets (60 mg total) by mouth daily with breakfast for 5 days. 3 tablets daily for 5 days. 09/10/22 09/15/22 Yes Margarette Canada, NP  promethazine-dextromethorphan (PROMETHAZINE-DM) 6.25-15 MG/5ML syrup Take 5 mLs by mouth 4 (four) times daily as needed. 09/10/22  Yes Margarette Canada, NP  spironolactone (ALDACTONE) 25 MG tablet Take by mouth. 12/27/18  Yes [provider]    Family History Family History  Problem Relation Age of Onset   Hypertension Mother    Colon cancer Maternal Uncle    Breast cancer  Maternal Aunt        mat great aunt    Social History Social History   Tobacco Use   Smoking status: Every Day    Packs/day: 1.00    Types: Cigarettes    Passive exposure: Current   Smokeless tobacco: Never  Vaping Use   Vaping Use: Never used  Substance Use Topics   Alcohol use: Not Currently    Comment: occasionally   Drug use: Yes    Types: Marijuana    Comment:  daily     Allergies   Simvastatin, Statins, Lisinopril, Losartan, and Pravastatin   Review of Systems Review of Systems  Constitutional:  Negative for fever.  HENT:  Positive for congestion and rhinorrhea. Negative for ear pain and sore throat.   Respiratory:  Positive for cough. Negative for shortness of breath and wheezing.   Gastrointestinal:  Negative for diarrhea, nausea and vomiting.  Neurological:  Positive for headaches.  Hematological: Negative.   Psychiatric/Behavioral: Negative.       Physical Exam Triage Vital Signs ED Triage Vitals  Enc Vitals Group     BP 09/10/22 1351 (!) 140/88     Pulse Rate 09/10/22 1351 84     Resp --      Temp 09/10/22 1351 98.8 F (37.1 C)     Temp Source 09/10/22 1351 Oral     SpO2 09/10/22 1351 94 %     Weight 09/10/22 1349 205 lb (93 kg)     Height 09/10/22 1349 '5\' 3"'$  (1.6 m)     Head Circumference --      Peak Flow --      Pain Score 09/10/22 1349 8     Pain Loc --      Pain Edu? --      Excl. in [place]? --    No data found.  Updated Vital Signs BP (!) 140/88 (BP Location: Left Arm)   Pulse 84   Temp 98.8 F (37.1 C) (Oral)   Ht '5\' 3"'$  (1.6 m)   Wt 205 lb (93 kg)   LMP 02/08/2015   SpO2 94%   BMI 36.31 kg/m   Visual Acuity Right Eye Distance:   Left Eye Distance:   Bilateral Distance:    Right Eye Near:   Left Eye Near:    Bilateral Near:     Physical Exam Vitals and nursing note reviewed.  Constitutional:      Appearance: Normal appearance. She is not ill-appearing.  HENT:     Head: Normocephalic and atraumatic.     Right Ear: Tympanic membrane, ear canal and external ear normal. There is no impacted cerumen.     Left Ear: Tympanic membrane, ear canal and external ear normal. There is no impacted cerumen.     Nose: Congestion and rhinorrhea present.     Mouth/Throat:     Mouth: Mucous membranes are moist.     Pharynx: Oropharynx is clear. Posterior oropharyngeal erythema present. No oropharyngeal exudate.   Cardiovascular:     Rate and Rhythm: Normal rate and regular rhythm.     Pulses: Normal pulses.     Heart sounds: Normal heart sounds. No murmur heard.    No friction rub. No gallop.  Pulmonary:     Effort: Pulmonary effort is normal.     Breath sounds: Normal breath sounds. No wheezing, rhonchi or rales.  Musculoskeletal:     Cervical back: Normal range of motion and neck supple.  Lymphadenopathy:     Cervical:  No cervical adenopathy.  Skin:    General: Skin is warm and dry.     Capillary Refill: Capillary refill takes less than 2 seconds.     Findings: No erythema or rash.  Neurological:     General: No focal deficit present.     Mental Status: She is alert and oriented to person, place, and time.  Psychiatric:        Mood and Affect: Mood normal.        Behavior: Behavior normal.        Thought Content: Thought content normal.        Judgment: Judgment normal.      UC Treatments / Results  Labs (all labs ordered are listed, but only abnormal results are displayed) Labs Reviewed  SARS CORONAVIRUS 2 BY RT PCR    EKG   Radiology No results found.  Procedures Procedures (including critical care time)  Medications Ordered in UC Medications - No data to display  Initial Impression / Assessment and Plan / UC Course  I have reviewed the triage vital signs and the nursing notes.  Pertinent labs & imaging results that were available during my care of the patient were reviewed by me and considered in my medical decision making (see chart for details).   61 year old female here for evaluation of a headache, nasal congestion, and nonproductive cough that been going on for the past 4 days since she traveled back from [place].  She is concerned she may have COVID and is requesting testing.  She denies any fever, sore throat, shortness of breath, wheezing, or GI complaints.  On exam patient has pearly-gray tympanic membranes bilaterally with normal reflex and clear external  auditory canals.  She is able to speak in full sentences and does not demonstrate any dyspnea or tachypnea.  Nasal mucosa is mildly erythematous and edematous with scant clear discharge in both nares.  Oropharyngeal exam reveals mild posterior oropharyngeal erythema without injection.  She does have mild clear postnasal drip.  No anterior cervical adenopathy on exam.  Cardiopulmonary exam reveals clear lung sounds in all fields.  Patient exam is consistent with an upper respiratory infection.  It is most likely viral.  She is outside the window for influenza treatment so I will not swab her for influenza but I will swab her for COVID.  COVID PCR is negative.  I will discharge patient with a diagnosis of viral URI with a cough and treat her with Atrovent nasal spray, Tessalon Perles, and Promethazine DM cough syrup.   Final Clinical Impressions(s) / UC Diagnoses   Final diagnoses:  Viral URI with cough     Discharge Instructions      Your test today was negative for COVID but do believe you have a viral respiratory infection.  Use the Atrovent nasal spray, 2 squirts in each nostril every 6 hours, as needed for runny nose and postnasal drip.  Use the Tessalon Perles every 8 hours during the day.  Take them with a small sip of water.  They may give you some numbness to the base of your tongue or a metallic taste in your mouth, this is normal.  Use the Promethazine DM cough syrup at bedtime for cough and congestion.  It will make you drowsy so do not take it during the day.  Start the prednisone tomorrow morning.  You will take 60 mg each morning with breakfast for 5 days to decrease pulm inflammation.  Return for reevaluation or see your  primary care provider for any new or worsening symptoms.      ED Prescriptions     Medication Sig Dispense Auth. Provider   benzonatate (TESSALON) 100 MG capsule Take 2 capsules (200 mg total) by mouth every 8 (eight) hours. 21 capsule Margarette Canada,  NP   ipratropium (ATROVENT) 0.06 % nasal spray Place 2 sprays into both nostrils 4 (four) times daily. 15 mL Margarette Canada, NP   promethazine-dextromethorphan (PROMETHAZINE-DM) 6.25-15 MG/5ML syrup Take 5 mLs by mouth 4 (four) times daily as needed. 118 mL Margarette Canada, NP   predniSONE (DELTASONE) 20 MG tablet Take 3 tablets (60 mg total) by mouth daily with breakfast for 5 days. 3 tablets daily for 5 days. 15 tablet Margarette Canada, NP      PDMP not reviewed this encounter.   Margarette Canada, NP 09/10/22 1442

## 2022-09-10 NOTE — ED Triage Notes (Signed)
Pt states she traveled to [place], c/o headache x4 days, pt reports hx COPD. Pt states she took some OTC decongestant, pressure in nose

## 2022-12-15 ENCOUNTER — Emergency Department
Admission: EM | Admit: 2022-12-15 | Discharge: 2022-12-15 | Discharge status: Against Medical Advice | Payer: BC Managed Care – PPO | Source: Home / Self Care

## 2022-12-15 ENCOUNTER — Encounter: Payer: Self-pay | Admitting: *Deleted

## 2022-12-15 ENCOUNTER — Emergency Department: Payer: BC Managed Care – PPO

## 2022-12-15 ENCOUNTER — Other Ambulatory Visit: Payer: Self-pay

## 2022-12-15 DIAGNOSIS — J1000 Influenza due to other identified influenza virus with unspecified type of pneumonia: Secondary | ICD-10-CM | POA: Diagnosis not present

## 2022-12-15 DIAGNOSIS — U071 COVID-19: Secondary | ICD-10-CM | POA: Insufficient documentation

## 2022-12-15 DIAGNOSIS — Z5321 Procedure and treatment not carried out due to patient leaving prior to being seen by health care provider: Secondary | ICD-10-CM | POA: Insufficient documentation

## 2022-12-15 DIAGNOSIS — J101 Influenza due to other identified influenza virus with other respiratory manifestations: Secondary | ICD-10-CM | POA: Insufficient documentation

## 2022-12-15 DIAGNOSIS — J441 Chronic obstructive pulmonary disease with (acute) exacerbation: Secondary | ICD-10-CM | POA: Diagnosis not present

## 2022-12-15 LAB — BASIC METABOLIC PANEL
Anion gap: 11 (ref 5–15)
BUN: 15 mg/dL (ref 8–23)
CO2: 30 mmol/L (ref 22–32)
Calcium: 8.6 mg/dL — ABNORMAL LOW (ref 8.9–10.3)
Chloride: 95 mmol/L — ABNORMAL LOW (ref 98–111)
Creatinine, Ser: 0.7 mg/dL (ref 0.44–1.00)
GFR, Estimated: >60 mL/min (ref >60)
Glucose, Bld: 151 mg/dL — ABNORMAL HIGH (ref 70–99)
Potassium: 3.8 mmol/L (ref 3.5–5.1)
Sodium: 136 mmol/L (ref 135–145)

## 2022-12-15 LAB — CBC
HCT: 53.9 % — ABNORMAL HIGH (ref 36.0–46.0)
Hemoglobin: 17.3 g/dL — ABNORMAL HIGH (ref 12.0–15.0)
MCH: 30.9 pg (ref 26.0–34.0)
MCHC: 32.1 g/dL (ref 30.0–36.0)
MCV: 96.4 fL (ref 80.0–100.0)
Platelets: 162 10*3/uL (ref 150–400)
RBC: 5.59 MIL/uL — ABNORMAL HIGH (ref 3.87–5.11)
RDW: 11.9 % (ref 11.5–15.5)
WBC: 11 10*3/uL — ABNORMAL HIGH (ref 4.0–10.5)
nRBC: 0 % (ref 0.0–0.2)

## 2022-12-15 LAB — TROPONIN I (HIGH SENSITIVITY): Troponin I (High Sensitivity): 7 ng/L (ref <18)

## 2022-12-15 NOTE — ED Provider Triage Note (Signed)
Emergency Medicine Provider Triage Evaluation Note  Malva Diesing, a 61 y.o. female  was evaluated in triage.  Pt complains of this of breath patient diagnosed with flu at the primary care today.  She is brought in via EMS from the [place] office with a history of COPD, now requiring 2 L of O2 per nasal cannula.  Patient has been sick for the last 4 to 5 days, but was found to be COVID and flu a positive today.  Review of Systems  Positive: Covid, flu, SOB Negative: FCS  Physical Exam  BP 125/89 (BP Location: Left Arm)   Pulse 77   Temp 99.8 F (37.7 C) (Oral)   Resp 20   Ht '5\' 3"'$  (1.6 m)   Wt 86.2 kg   LMP 02/08/2015   SpO2 92%   BMI 33.66 kg/m  Gen:   Awake, no distress   Resp:  Normal effort  MSK:   Moves extremities without difficulty  Other:    Medical Decision Making  Medically screening exam initiated at 4:00 PM.  Appropriate orders placed.  Isabeau Mccalla was informed that the remainder of the evaluation will be completed by another provider, this initial triage assessment does not replace that evaluation, and the importance of remaining in the ED until their evaluation is complete.  Patient to the ED for evaluation of hypoxia with a recent diagnosis of COVID and flu.  Patient presents on 2 L of O2 per nasal cannula via EMS from the primary care office.   Melvenia Needles, PA-C 12/15/22 2357

## 2022-12-15 NOTE — ED Triage Notes (Signed)
First Nurse Note:  Arrives from [facility] via [facility].  C.O feeling sic x 4-5 days.  -  COVID + Flu A.  At PCP RA sats mid 80's.  Placed on 2l/  sats 94-95%.  VS wnl.

## 2022-12-15 NOTE — ED Triage Notes (Signed)
Pt reports sob.  Pt dx with flu today at [place] primary care.  Pt on 2 liters [place].  Pt brought in via ems from [place] office   hx copd.  Pt alert  speech clear.  No chest pain.

## 2022-12-16 ENCOUNTER — Inpatient Hospital Stay
Admission: EM | Admit: 2022-12-16 | Discharge: 2022-12-19 | DRG: 193 | Disposition: A | Discharge status: Home / Self Care | Payer: BC Managed Care – PPO | Attending: Internal Medicine | Admitting: Internal Medicine

## 2022-12-16 ENCOUNTER — Other Ambulatory Visit: Payer: Self-pay

## 2022-12-16 ENCOUNTER — Emergency Department: Payer: BC Managed Care – PPO

## 2022-12-16 DIAGNOSIS — Z8249 Family history of ischemic heart disease and other diseases of the circulatory system: Secondary | ICD-10-CM | POA: Diagnosis not present

## 2022-12-16 DIAGNOSIS — F17210 Nicotine dependence, cigarettes, uncomplicated: Secondary | ICD-10-CM | POA: Diagnosis present

## 2022-12-16 DIAGNOSIS — E785 Hyperlipidemia, unspecified: Secondary | ICD-10-CM | POA: Diagnosis present

## 2022-12-16 DIAGNOSIS — Z6833 Body mass index (BMI) 33.0-33.9, adult: Secondary | ICD-10-CM | POA: Diagnosis not present

## 2022-12-16 DIAGNOSIS — Z888 Allergy status to other drugs, medicaments and biological substances status: Secondary | ICD-10-CM

## 2022-12-16 DIAGNOSIS — Z87442 Personal history of urinary calculi: Secondary | ICD-10-CM | POA: Diagnosis not present

## 2022-12-16 DIAGNOSIS — J111 Influenza due to unidentified influenza virus with other respiratory manifestations: Secondary | ICD-10-CM

## 2022-12-16 DIAGNOSIS — J209 Acute bronchitis, unspecified: Secondary | ICD-10-CM | POA: Diagnosis present

## 2022-12-16 DIAGNOSIS — J441 Chronic obstructive pulmonary disease with (acute) exacerbation: Secondary | ICD-10-CM | POA: Diagnosis present

## 2022-12-16 DIAGNOSIS — J1000 Influenza due to other identified influenza virus with unspecified type of pneumonia: Principal | ICD-10-CM | POA: Diagnosis present

## 2022-12-16 DIAGNOSIS — J440 Chronic obstructive pulmonary disease with acute lower respiratory infection: Secondary | ICD-10-CM | POA: Diagnosis present

## 2022-12-16 DIAGNOSIS — I10 Essential (primary) hypertension: Secondary | ICD-10-CM | POA: Diagnosis present

## 2022-12-16 DIAGNOSIS — J9621 Acute and chronic respiratory failure with hypoxia: Secondary | ICD-10-CM | POA: Insufficient documentation

## 2022-12-16 DIAGNOSIS — Z1152 Encounter for screening for COVID-19: Secondary | ICD-10-CM

## 2022-12-16 DIAGNOSIS — Z79899 Other long term (current) drug therapy: Secondary | ICD-10-CM

## 2022-12-16 DIAGNOSIS — J09X1 Influenza due to identified novel influenza A virus with pneumonia: Secondary | ICD-10-CM | POA: Diagnosis not present

## 2022-12-16 DIAGNOSIS — Z8616 Personal history of COVID-19: Secondary | ICD-10-CM | POA: Diagnosis not present

## 2022-12-16 DIAGNOSIS — J9601 Acute respiratory failure with hypoxia: Secondary | ICD-10-CM | POA: Insufficient documentation

## 2022-12-16 DIAGNOSIS — I119 Hypertensive heart disease without heart failure: Secondary | ICD-10-CM | POA: Diagnosis present

## 2022-12-16 DIAGNOSIS — R7303 Prediabetes: Secondary | ICD-10-CM | POA: Diagnosis present

## 2022-12-16 LAB — CBC WITH DIFFERENTIAL/PLATELET
Abs Immature Granulocytes: 0.03 10*3/uL (ref 0.00–0.07)
Basophils Absolute: 0 10*3/uL (ref 0.0–0.1)
Basophils Relative: 0 %
Eosinophils Absolute: 0 10*3/uL (ref 0.0–0.5)
Eosinophils Relative: 0 %
HCT: 52.5 % — ABNORMAL HIGH (ref 36.0–46.0)
Hemoglobin: 16.6 g/dL — ABNORMAL HIGH (ref 12.0–15.0)
Immature Granulocytes: 0 %
Lymphocytes Relative: 24 %
Lymphs Abs: 2.2 10*3/uL (ref 0.7–4.0)
MCH: 31.2 pg (ref 26.0–34.0)
MCHC: 31.6 g/dL (ref 30.0–36.0)
MCV: 98.7 fL (ref 80.0–100.0)
Monocytes Absolute: 0.8 10*3/uL (ref 0.1–1.0)
Monocytes Relative: 9 %
Neutro Abs: 6 10*3/uL (ref 1.7–7.7)
Neutrophils Relative %: 67 %
Platelets: 157 10*3/uL (ref 150–400)
RBC: 5.32 MIL/uL — ABNORMAL HIGH (ref 3.87–5.11)
RDW: 11.9 % (ref 11.5–15.5)
WBC: 9.1 10*3/uL (ref 4.0–10.5)
nRBC: 0 % (ref 0.0–0.2)

## 2022-12-16 LAB — BASIC METABOLIC PANEL
Anion gap: 8 (ref 5–15)
BUN: 16 mg/dL (ref 8–23)
CO2: 34 mmol/L — ABNORMAL HIGH (ref 22–32)
Calcium: 8.7 mg/dL — ABNORMAL LOW (ref 8.9–10.3)
Chloride: 97 mmol/L — ABNORMAL LOW (ref 98–111)
Creatinine, Ser: 0.58 mg/dL (ref 0.44–1.00)
GFR, Estimated: >60 mL/min (ref >60)
Glucose, Bld: 112 mg/dL — ABNORMAL HIGH (ref 70–99)
Potassium: 3.6 mmol/L (ref 3.5–5.1)
Sodium: 139 mmol/L (ref 135–145)

## 2022-12-16 LAB — RESP PANEL BY RT-PCR (RSV, FLU A&B, COVID)  RVPGX2
Influenza A by PCR: NEGATIVE
Influenza B by PCR: NEGATIVE
Resp Syncytial Virus by PCR: NEGATIVE
SARS Coronavirus 2 by RT PCR: NEGATIVE

## 2022-12-16 LAB — TROPONIN I (HIGH SENSITIVITY): Troponin I (High Sensitivity): 4 ng/L (ref <18)

## 2022-12-16 LAB — PROCALCITONIN: Procalcitonin: <0.1 ng/mL

## 2022-12-16 LAB — BRAIN NATRIURETIC PEPTIDE: B Natriuretic Peptide: 18.6 pg/mL (ref 0.0–100.0)

## 2022-12-16 MED ORDER — ACETAMINOPHEN 325 MG PO TABS
650.0000 mg | ORAL_TABLET | Freq: Once | ORAL | Status: AC
  Administered 2022-12-16: 650 mg via ORAL
  Filled 2022-12-16: qty 2

## 2022-12-16 MED ORDER — PREDNISONE 20 MG PO TABS
60.0000 mg | ORAL_TABLET | Freq: Once | ORAL | Status: AC
  Administered 2022-12-16: 60 mg via ORAL
  Filled 2022-12-16: qty 3

## 2022-12-16 MED ORDER — IPRATROPIUM-ALBUTEROL 0.5-2.5 (3) MG/3ML IN SOLN
3.0000 mL | Freq: Four times a day (QID) | RESPIRATORY_TRACT | Status: DC
  Administered 2022-12-17 – 2022-12-18 (×5): 3 mL via RESPIRATORY_TRACT
  Filled 2022-12-16 (×6): qty 3

## 2022-12-16 MED ORDER — ENOXAPARIN SODIUM 40 MG/0.4ML IJ SOSY
40.0000 mg | PREFILLED_SYRINGE | Freq: Every day | INTRAMUSCULAR | Status: DC
  Administered 2022-12-17 – 2022-12-18 (×2): 40 mg via SUBCUTANEOUS
  Filled 2022-12-16 (×3): qty 0.4

## 2022-12-16 MED ORDER — INSULIN ASPART 100 UNIT/ML IJ SOLN
0.0000 [IU] | Freq: Three times a day (TID) | INTRAMUSCULAR | Status: DC
  Administered 2022-12-17: 1 [IU] via SUBCUTANEOUS
  Administered 2022-12-17 (×2): 2 [IU] via SUBCUTANEOUS
  Filled 2022-12-16 (×3): qty 1

## 2022-12-16 MED ORDER — ONDANSETRON HCL 4 MG/2ML IJ SOLN: 4.0000 mg | Freq: Four times a day (QID) | INTRAMUSCULAR | Status: DC | PRN

## 2022-12-16 MED ORDER — ASPIRIN 81 MG PO TBEC
81.0000 mg | DELAYED_RELEASE_TABLET | Freq: Every day | ORAL | Status: DC
  Administered 2022-12-17 – 2022-12-19 (×4): 81 mg via ORAL
  Filled 2022-12-16 (×4): qty 1

## 2022-12-16 MED ORDER — EZETIMIBE 10 MG PO TABS
10.0000 mg | ORAL_TABLET | Freq: Every day | ORAL | Status: DC
  Administered 2022-12-17 – 2022-12-19 (×4): 10 mg via ORAL
  Filled 2022-12-16 (×4): qty 1

## 2022-12-16 MED ORDER — ALBUTEROL SULFATE (2.5 MG/3ML) 0.083% IN NEBU: 2.5000 mg | INHALATION_SOLUTION | RESPIRATORY_TRACT | Status: DC | PRN

## 2022-12-16 MED ORDER — ALBUTEROL SULFATE HFA 108 (90 BASE) MCG/ACT IN AERS
2.0000 | INHALATION_SPRAY | Freq: Once | RESPIRATORY_TRACT | Status: DC
  Filled 2022-12-16: qty 6.7

## 2022-12-16 MED ORDER — ONDANSETRON HCL 4 MG PO TABS: 4.0000 mg | ORAL_TABLET | Freq: Four times a day (QID) | ORAL | Status: DC | PRN

## 2022-12-16 MED ORDER — ACETAMINOPHEN 650 MG RE SUPP: 650.0000 mg | Freq: Four times a day (QID) | RECTAL | Status: DC | PRN

## 2022-12-16 MED ORDER — GUAIFENESIN ER 600 MG PO TB12
600.0000 mg | ORAL_TABLET | Freq: Two times a day (BID) | ORAL | Status: DC
  Administered 2022-12-17 – 2022-12-19 (×6): 600 mg via ORAL
  Filled 2022-12-16 (×6): qty 1

## 2022-12-16 MED ORDER — BENZONATATE 100 MG PO CAPS
200.0000 mg | ORAL_CAPSULE | Freq: Three times a day (TID) | ORAL | Status: DC | PRN
  Administered 2022-12-17: 200 mg via ORAL
  Filled 2022-12-16: qty 2

## 2022-12-16 MED ORDER — PREDNISONE 20 MG PO TABS
40.0000 mg | ORAL_TABLET | Freq: Every day | ORAL | Status: DC
  Administered 2022-12-17 – 2022-12-19 (×3): 40 mg via ORAL
  Filled 2022-12-16 (×3): qty 2

## 2022-12-16 MED ORDER — HYDROCODONE-ACETAMINOPHEN 5-325 MG PO TABS
1.0000 | ORAL_TABLET | ORAL | Status: DC | PRN
  Administered 2022-12-17: 2 via ORAL
  Administered 2022-12-17 (×2): 1 via ORAL
  Administered 2022-12-18 – 2022-12-19 (×3): 2 via ORAL
  Filled 2022-12-16 (×2): qty 1
  Filled 2022-12-16 (×4): qty 2

## 2022-12-16 MED ORDER — ACETAMINOPHEN 325 MG PO TABS
650.0000 mg | ORAL_TABLET | Freq: Four times a day (QID) | ORAL | Status: DC | PRN
  Administered 2022-12-17 – 2022-12-18 (×2): 650 mg via ORAL
  Filled 2022-12-16 (×2): qty 2

## 2022-12-16 NOTE — ED Triage Notes (Addendum)
Pt in with co shortness of breath states was dx with flu by pmd yesterday. Results are in epic, came here yesterday for the same but was not seen due to wait times. Pt here for worsening shob, no resp distress noted in triage. Pt able to speak in full sentences. Pt had blood work and cxr done here yesterday. States wears o2 at home when needed, sats normally 93% on RA. Currently 86% on RA, o2 at 2l per [place] sats up to 96%.

## 2022-12-16 NOTE — ED Notes (Signed)
Pt reports she was seen at [place] yesterday and was diagnosed w/ Flu B

## 2022-12-16 NOTE — ED Notes (Signed)
Patient transported to X-ray 

## 2022-12-16 NOTE — Assessment & Plan Note (Signed)
Continue spironolactone?

## 2022-12-16 NOTE — H&P (Signed)
History and Physical    Patient: Gloria Jones MWN:[id number] DOB: 04-Aug-1961 DOA: 12/16/2022 DOS: the patient was seen and examined on 12/16/2022 PCP: [facility]  Patient coming from: Home  Chief Complaint:  Chief Complaint  Patient presents with   Shortness of Breath    HPI: Gloria Jones is a 61 y.o. female with medical history significant for HTN, COVID, prediabetes, Diagnosed with influenza A on 12/27 (seen in [place]) who presents to the ED with worsening shortness of breath over the past 5 days when seen the day prior at the urgent care she was told she was outside the treatment window for Tamiflu.  She had a dry cough, muscle aches.  She denies fever, vomiting, diarrhea or abdominal pain.  She is getting only partial relief with her inhalers. ED course and data review: Afebrile with BP 109/79, pulse 79, respirations 22 with O2 sat 86% on room air improving to the low 90s on 2 L.  WBC WNL with procalcitonin less than 0.10.  BNP 18.6 and troponin 4.  Respiratory viral panel was negative for COVID flu and RSV to a positive influenza A result is seen in care everywhere from 12/27. Chest x-ray showing possible atypical pneumonia as outlined below: IMPRESSION: Cardiomegaly with slightly increased interstitial prominence, may reflect edema or viral/atypical infection.    Patient was treated with prednisone, albuterol.  Hospitalist consulted for admission.   Review of Systems: As mentioned in the history of present illness. All other systems reviewed and are negative.  Past Medical History:  Diagnosis Date   Allergic state    Asthma    Bronchitis    COPD (chronic obstructive pulmonary disease) ([place])    History of kidney stones    Hypertension    Pre-diabetes    Past Surgical History:  Procedure Laterality Date   COLONOSCOPY WITH PROPOFOL N/A 06/28/2017   Procedure: COLONOSCOPY WITH PROPOFOL;  Surgeon: Lollie Sails, MD;  Location: [facility]  ENDOSCOPY;  Service: Endoscopy;  Laterality: N/A;   CYSTOSCOPY/URETEROSCOPY/HOLMIUM LASER/STENT PLACEMENT Right 07/30/2022   Procedure: CYSTOSCOPY/URETEROSCOPY/HOLMIUM LASER/STENT PLACEMENT;  Surgeon: Billey Co, MD;  Location: [facility] ORS;  Service: Urology;  Laterality: Right;   ESOPHAGOGASTRODUODENOSCOPY (EGD) WITH PROPOFOL N/A 06/28/2017   Procedure: ESOPHAGOGASTRODUODENOSCOPY (EGD) WITH PROPOFOL;  Surgeon: Lollie Sails, MD;  Location: [facility] ENDOSCOPY;  Service: Endoscopy;  Laterality: N/A;   KNEE SURGERY Left    meniscus repair   OVARY SURGERY Left 12/1977   partial ovary and fallopian tube removed   Social History:  reports that she has been smoking cigarettes. She has been smoking an average of 1 pack per day. She has been exposed to tobacco smoke. She has never used smokeless tobacco. She reports that she does not currently use alcohol. She reports current drug use. Drug: Marijuana.  Allergies  Allergen Reactions   Simvastatin     Other reaction(s): Muscle Pain   Statins     Other reaction(s): Other (See Comments) arthralgia   Lisinopril     Other reaction(s): Cough   Losartan     Other reaction(s): Headache   Pravastatin Other (See Comments)    Family History  Problem Relation Age of Onset   Hypertension Mother    Colon cancer Maternal Uncle    Breast cancer Maternal Aunt        mat great aunt    Prior to Admission medications   Medication Sig Start Date End Date Taking? Authorizing Provider  albuterol (PROVENTIL HFA;VENTOLIN HFA) 108 (90 BASE)  MCG/ACT inhaler Inhale 1-2 puffs into the lungs every 4 (four) hours as needed for wheezing or shortness of breath. 05/24/15   Betancourt, Aura Fey, NP  aspirin EC 81 MG tablet Take 81 mg by mouth daily. Swallow whole.    [provider]  benzonatate (TESSALON) 100 MG capsule Take 2 capsules (200 mg total) by mouth every 8 (eight) hours. 09/10/22   Margarette Canada, NP  Cyanocobalamin (VITAMIN B12) 1000 MCG TBCR Take by  mouth.    [provider]  DULoxetine (CYMBALTA) 20 MG capsule TAKE ONE CAPSULE BY MOUTH THREE TIMES DAILY WITH FOOD 07/02/22   [provider]  ezetimibe (ZETIA) 10 MG tablet Take 1 tablet by mouth daily. 07/06/22 07/06/23  [provider]  Fluticasone-Umeclidin-Vilant (TRELEGY ELLIPTA) 100-62.5-25 MCG/ACT AEPB Inhale into the lungs. 02/01/22   [provider]  ipratropium (ATROVENT) 0.06 % nasal spray Place 2 sprays into both nostrils 4 (four) times daily. 09/10/22   Margarette Canada, NP  promethazine-dextromethorphan (PROMETHAZINE-DM) 6.25-15 MG/5ML syrup Take 5 mLs by mouth 4 (four) times daily as needed. 09/10/22   Margarette Canada, NP  spironolactone (ALDACTONE) 25 MG tablet Take by mouth. 12/27/18   [provider]    Physical Exam: Vitals:   12/16/22 1825 12/16/22 1840 12/16/22 1900 12/16/22 2100  BP: 128/85 130/71 135/88 (!) 119/104  Pulse: 82 82 83 77  Resp: '17 19 17 18  '$ Temp: 98.4 F (36.9 C)     TempSrc: Oral     SpO2: 91% 92% 93% (!) 88%  Weight:      Height:       Physical Exam Vitals and nursing note reviewed.  Constitutional:      General: She is not in acute distress. HENT:     Head: Normocephalic and atraumatic.  Cardiovascular:     Rate and Rhythm: Normal rate and regular rhythm.     Heart sounds: Normal heart sounds.  Pulmonary:     Effort: Tachypnea present.     Breath sounds: Wheezing present.  Abdominal:     Palpations: Abdomen is soft.     Tenderness: There is no abdominal tenderness.  Neurological:     Mental Status: Mental status is at baseline.     Labs on Admission: I have personally reviewed following labs and imaging studies  CBC: Recent Labs  Lab 12/15/22 1548 12/16/22 1841  WBC 11.0* 9.1  NEUTROABS  --  6.0  HGB 17.3* 16.6*  HCT 53.9* 52.5*  MCV 96.4 98.7  PLT 162 474   Basic Metabolic Panel: Recent Labs  Lab 12/15/22 1548 12/16/22 1841  NA 136 139  K 3.8 3.6  CL 95* 97*  CO2 30 34*  GLUCOSE  151* 112*  BUN 15 16  CREATININE 0.70 0.58  CALCIUM 8.6* 8.7*   GFR: Estimated Creatinine Clearance: 75.9 mL/min (by C-G formula based on SCr of 0.58 mg/dL). Liver Function Tests: No results for input(s): "AST", "ALT", "ALKPHOS", "BILITOT", "PROT", "ALBUMIN" in the last 168 hours. No results for input(s): "LIPASE", "AMYLASE" in the last 168 hours. No results for input(s): "AMMONIA" in the last 168 hours. Coagulation Profile: No results for input(s): "INR", "PROTIME" in the last 168 hours. Cardiac Enzymes: No results for input(s): "CKTOTAL", "CKMB", "CKMBINDEX", "TROPONINI" in the last 168 hours. BNP (last 3 results) No results for input(s): "PROBNP" in the last 8760 hours. HbA1C: No results for input(s): "HGBA1C" in the last 72 hours. CBG: No results for input(s): "GLUCAP" in the last 168 hours. Lipid Profile:  No results for input(s): "CHOL", "HDL", "LDLCALC", "TRIG", "CHOLHDL", "LDLDIRECT" in the last 72 hours. Thyroid Function Tests: No results for input(s): "TSH", "T4TOTAL", "FREET4", "T3FREE", "THYROIDAB" in the last 72 hours. Anemia Panel: No results for input(s): "VITAMINB12", "FOLATE", "FERRITIN", "TIBC", "IRON", "RETICCTPCT" in the last 72 hours. Urine analysis:    Component Value Date/Time   COLORURINE YELLOW 08/10/2022 [place] 08/10/2022 [place] 09/29/2014 1514   LABSPEC 1.020 08/10/2022 1102   LABSPEC 1.020 09/29/2014 1514   PHURINE 5.5 08/10/2022 1102   GLUCOSEU NEGATIVE 08/10/2022 1102   GLUCOSEU NEGATIVE 09/29/2014 1514   HGBUR MODERATE (A) 08/10/2022 [place] 08/10/2022 [place] 09/29/2014 [place] 08/10/2022 1102   PROTEINUR 100 (A) 08/10/2022 1102   NITRITE NEGATIVE 08/10/2022 1102   LEUKOCYTESUR SMALL (A) 08/10/2022 [place] 09/29/2014 1514    Radiological Exams on Admission: DG Chest 2 View  Result Date: 12/16/2022 CLINICAL DATA:   Shortness of breath EXAM: CHEST - 2 VIEW COMPARISON:  Chest radiograph December 15, 2022. FINDINGS: Unchanged cardiac enlargement with central vascular congestion. Slightly increased interstitial prominence no visible pleural effusion or pneumothorax. The visualized skeletal structures are unchanged. IMPRESSION: Cardiomegaly with slightly increased interstitial prominence, may reflect edema or viral/atypical infection. Electronically Signed   By: Dahlia Bailiff M.D.   On: 12/16/2022 18:15   DG Chest 2 View  Result Date: 12/15/2022 CLINICAL DATA:  Shortness of breath EXAM: CHEST - 2 VIEW COMPARISON:  11/27/2020 FINDINGS: Heart is borderline in size. Mediastinal contours within normal limits. Interstitial prominence throughout the lungs. Bibasilar opacities. No effusions or acute bony abnormality. IMPRESSION: Interstitial prominence throughout the lungs could reflect interstitial edema or atypical infection. Bibasilar opacities, favor atelectasis. Electronically Signed   By: Rolm Baptise M.D.   On: 12/15/2022 16:11     Data Reviewed: Relevant notes from primary care and specialist visits, past discharge summaries as available in EHR, including Care Everywhere. Prior diagnostic testing as pertinent to current admission diagnoses Updated medications and problem lists for reconciliation ED course, including vitals, labs, imaging, treatment and response to treatment Triage notes, nursing and pharmacy notes and ED provider's notes Notable results as noted in HPI   Assessment and Plan: * Influenza A with pneumonia COPD with acute bronchitis Acute respiratory failure with hypoxia Patient with flulike symptoms with onset at about 5 days prior.  Flu A+ on Care Everywhere in outpatient setting on 12/27 though negative in the ED on 12/28 Chest x-ray showing possible atypical infection Outside of window for Tamiflu so will hold off Nebulized bronchodilators and oral steroids Antitussives, flutter  valve Supplemental oxygen  Prediabetes Not currently on any medication Will give diabetic diet Hemoglobin A1c and check CBG twice daily given that patient was started on steroid  Essential (primary) hypertension Continue spironolactone        DVT prophylaxis: Lovenox  Consults: none  Advance Care Planning:   Code Status: Full Code   Family Communication: none  Disposition Plan: Back to previous home environment  Severity of Illness: The appropriate patient status for this patient is INPATIENT. Inpatient status is judged to be reasonable and necessary in order to provide the required intensity of service to ensure the patient's safety. The patient's presenting symptoms, physical exam findings, and initial radiographic and laboratory data in the context of their chronic comorbidities is felt to place them at high risk for further clinical deterioration. Furthermore, it is not  anticipated that the patient will be medically stable for discharge from the hospital within 2 midnights of admission.   * I certify that at the point of admission it is my clinical judgment that the patient will require inpatient hospital care spanning beyond 2 midnights from the point of admission due to high intensity of service, high risk for further deterioration and high frequency of surveillance required.*  Author: Athena Masse, MD 12/16/2022 10:00 PM  For on call review [email or web address].

## 2022-12-16 NOTE — Assessment & Plan Note (Signed)
Not currently on any medication Will give diabetic diet Hemoglobin A1c and check CBG twice daily given that patient was started on steroid

## 2022-12-16 NOTE — ED Provider Triage Note (Signed)
Emergency Medicine Provider Triage Evaluation Note  Houda Brau, a 61 y.o. female  was evaluated in triage.  Pt complains of ongoing SOB. She initially presented yesterday, but left prior to evaluation due to the protracted wait. She return noting improved appetite but still requiring O2 supplementation above her PRN base.   Review of Systems  Positive: SOB, PNA, influenza Negative: CP, NVD  Physical Exam  BP 109/79 (BP Location: Left Arm)   Pulse 79   Temp 98.9 F (37.2 C) (Oral)   Resp (!) 22   Ht '5\' 3"'$  (1.6 m)   Wt 86.2 kg   LMP 02/08/2015   SpO2 (!) 86%   BMI 33.66 kg/m  Gen:   Awake, no distress  NAD Resp:  Normal effort  MSK:   Moves extremities without difficulty  Other:    Medical Decision Making  Medically screening exam initiated at 5:01 PM.  Appropriate orders placed.  Angelle Isais was informed that the remainder of the evaluation will be completed by another provider, this initial triage assessment does not replace that evaluation, and the importance of remaining in the ED until their evaluation is complete.  Patient returns to the ED for continued SOB after eloping yesterday.    Melvenia Needles, PA-C 12/16/22 2326

## 2022-12-16 NOTE — ED Triage Notes (Signed)
Oxygen tank to patient, patient placed on 2l/ [place]

## 2022-12-16 NOTE — ED Provider Notes (Signed)
[facility] Provider Note    Event Date/Time   First MD Initiated Contact with Patient 12/16/22 1716     (approximate)   History   Chief Complaint Shortness of Breath   HPI  Gloria Jones is a 61 y.o. female with past medical history of hypertension and COPD who presents to the ED complaining of shortness of breath.  Patient reports that she has been dealing with increasing difficulty breathing over the past 5 days.  She was seen by her PCP for this problem 2 days ago and diagnosed with influenza, was told she was outside the window for Tamiflu.  She has had increasing difficulty breathing since then, was seen in the ED yesterday but left before being seen due to the long wait.  She has continued to feel short of breath since then with a dry cough and some muscle aches, denies fevers, nausea, vomiting, or diarrhea.  She has used her albuterol inhaler at home with partial relief.     Physical Exam   Triage Vital Signs: ED Triage Vitals  Enc Vitals Group     BP 12/16/22 1341 109/79     Pulse Rate 12/16/22 1341 79     Resp 12/16/22 1341 (!) 22     Temp 12/16/22 1341 98.9 F (37.2 C)     Temp Source 12/16/22 1341 Oral     SpO2 12/16/22 1341 (!) 86 %     Weight 12/16/22 1342 190 lb (86.2 kg)     Height 12/16/22 1342 '5\' 3"'$  (1.6 m)     Head Circumference --      Peak Flow --      Pain Score 12/16/22 1342 10     Pain Loc --      Pain Edu? --      Excl. in [place]? --     Most recent vital signs: Vitals:   12/16/22 1840 12/16/22 1900  BP: 130/71 135/88  Pulse: 82 83  Resp: 19 17  Temp:    SpO2: 92% 93%    Constitutional: Alert and oriented. Eyes: Conjunctivae are normal. Head: Atraumatic. Nose: No congestion/rhinnorhea. Mouth/Throat: Mucous membranes are moist.  Cardiovascular: Normal rate, regular rhythm. Grossly normal heart sounds.  2+ radial pulses bilaterally. Respiratory: Normal respiratory effort.  No retractions. Lungs with end  expiratory wheezing noted. Gastrointestinal: Soft and nontender. No distention. Musculoskeletal: No lower extremity tenderness nor edema.  Neurologic:  Normal speech and language. No gross focal neurologic deficits are appreciated.    ED Results / Procedures / Treatments   Labs (all labs ordered are listed, but only abnormal results are displayed) Labs Reviewed  CBC WITH DIFFERENTIAL/PLATELET - Abnormal; Notable for the following components:      Result Value   RBC 5.32 (*)    Hemoglobin 16.6 (*)    HCT 52.5 (*)    All other components within normal limits  BASIC METABOLIC PANEL - Abnormal; Notable for the following components:   Chloride 97 (*)    CO2 34 (*)    Glucose, Bld 112 (*)    Calcium 8.7 (*)    All other components within normal limits  RESP PANEL BY RT-PCR (RSV, FLU A&B, COVID)  RVPGX2  BRAIN NATRIURETIC PEPTIDE  PROCALCITONIN  TROPONIN I (HIGH SENSITIVITY)  TROPONIN I (HIGH SENSITIVITY)    RADIOLOGY Chest x-ray reviewed and interpreted by me with interstitial infiltrates concerning for atypical infection versus edema.  PROCEDURES:  Critical Care performed: Yes, see critical care procedure note(s)  .  Critical Care  Performed by: Blake Divine, MD Authorized by: Blake Divine, MD   Critical care provider statement:    Critical care time (minutes):  30   Critical care time was exclusive of:  Separately billable procedures and treating other patients and teaching time   Critical care was necessary to treat or prevent imminent or life-threatening deterioration of the following conditions:  Respiratory failure   Critical care was time spent personally by me on the following activities:  Development of treatment plan with patient or surrogate, discussions with consultants, evaluation of patient's response to treatment, examination of patient, ordering and review of laboratory studies, ordering and review of radiographic studies, ordering and performing treatments  and interventions, pulse oximetry, re-evaluation of patient's condition and review of old charts   I assumed direction of critical care for this patient from another provider in my specialty: no     Care discussed with: admitting provider      MEDICATIONS ORDERED IN ED: Medications  albuterol (VENTOLIN HFA) 108 (90 Base) MCG/ACT inhaler 2 puff (2 puffs Inhalation Not Given 12/16/22 1827)  acetaminophen (TYLENOL) tablet 650 mg (650 mg Oral Given 12/16/22 1827)  predniSONE (DELTASONE) tablet 60 mg (60 mg Oral Given 12/16/22 1827)     IMPRESSION / MDM / [place] / ED COURSE  I reviewed the triage vital signs and the nursing notes.                              61 y.o. female with past medical history of hypertension and COPD who presents to the ED complaining of increasing difficulty breathing over the past 5 days after she was diagnosed with influenza by her PCP.  Patient's presentation is most consistent with acute presentation with potential threat to life or bodily function.  Differential diagnosis includes, but is not limited to, influenza, pneumonia, CHF, COPD exacerbation, AKI, anemia, electrolyte abnormality.  Patient nontoxic-appearing and in no acute distress, but noted to be hypoxic to 86% on room air in triage.  She was placed on 2 L nasal cannula with improvement, no significant increased work of breathing on my assessment but she does have some end expiratory wheezing.  Hypoxia likely due to commendation of influenza as well as mild COPD exacerbation.  Chest x-ray performed yesterday showed interstitial opacities consistent with atypical infection versus edema.  CHF seems less likely given patient does not appear clinically fluid overloaded.  We will repeat chest x-ray today along with labs and EKG.  Anticipate admission for hypoxic respiratory failure.  Chest x-ray again shows interstitial infiltrates, low suspicion for CHF given BNP within normal limits.  Findings  likely due to influenza and low suspicion for bacterial pneumonia but will check procalcitonin.  No significant anemia, leukocytosis, electrolyte abnormality, or AKI noted, troponin within normal limits.  Case discussed with hospitalist for admission for hypoxic respiratory failure secondary to influenza and COPD exacerbation.      FINAL CLINICAL IMPRESSION(S) / ED DIAGNOSES   Final diagnoses:  Acute respiratory failure with hypoxia (HCC)  Influenza  COPD exacerbation ([place])     Rx / DC Orders   ED Discharge Orders     None        Note:  This document was prepared using Dragon voice recognition software and may include unintentional dictation errors.   Blake Divine, MD 12/16/22 [phone]

## 2022-12-16 NOTE — Assessment & Plan Note (Signed)
COPD with acute bronchitis Acute respiratory failure with hypoxia Patient with flulike symptoms with onset at about 5 days prior.  Flu A+ on Care Everywhere in outpatient setting on 12/27 though negative in the ED on 12/28 Chest x-ray showing possible atypical infection Outside of window for Tamiflu so will hold off Nebulized bronchodilators and oral steroids Antitussives, flutter valve Supplemental oxygen

## 2022-12-17 ENCOUNTER — Encounter: Payer: Self-pay | Admitting: Internal Medicine

## 2022-12-17 DIAGNOSIS — J09X1 Influenza due to identified novel influenza A virus with pneumonia: Secondary | ICD-10-CM | POA: Diagnosis not present

## 2022-12-17 LAB — CBG MONITORING, ED
Glucose-Capillary: 138 mg/dL — ABNORMAL HIGH (ref 70–99)
Glucose-Capillary: 150 mg/dL — ABNORMAL HIGH (ref 70–99)
Glucose-Capillary: 189 mg/dL — ABNORMAL HIGH (ref 70–99)
Glucose-Capillary: 197 mg/dL — ABNORMAL HIGH (ref 70–99)

## 2022-12-17 MED ORDER — UMECLIDINIUM BROMIDE 62.5 MCG/ACT IN AEPB
1.0000 | INHALATION_SPRAY | Freq: Every day | RESPIRATORY_TRACT | Status: DC
  Administered 2022-12-17 – 2022-12-19 (×3): 1 via RESPIRATORY_TRACT
  Filled 2022-12-17: qty 7

## 2022-12-17 MED ORDER — FLUTICASONE FUROATE-VILANTEROL 100-25 MCG/ACT IN AEPB
1.0000 | INHALATION_SPRAY | Freq: Every day | RESPIRATORY_TRACT | Status: DC
  Administered 2022-12-17 – 2022-12-19 (×3): 1 via RESPIRATORY_TRACT
  Filled 2022-12-17: qty 28

## 2022-12-17 NOTE — Progress Notes (Signed)
PROGRESS NOTE    Gloria Jones  ZDG:[id number] DOB: July 11, 1961 DOA: 12/16/2022 PCP: [facility]    Brief Narrative:   Gloria Jones is a 61 y.o. female with past medical history significant for essential pretension, type 2 diabetes mellitus, history of COVID-19 viral infection recently diagnosed with influenza A on 12/27 who presented to [facility] ED on 12/28 with progressive shortness of breath.  Patient reports dry cough, muscle aches.  Denies fever, no vomiting, no diarrhea, no abdominal pain.  She was seen at urgent care day prior to admission and was told she was outside the treatment window for Tamiflu.  She has not been getting good relief with her inhalers.  In the ED, temperature 98.9 F, HR 79, RR 22, BP 109/79, SpO2 86% on room air.  Sodium 139, potassium 3.6, chloride 97, CO2 34, glucose 112, BUN 16, creatinine 0.58.  WBC 9.1, hemoglobin 16.6, platelets 157.  BNP 18.6.  Procalcitonin less than 0.10.  High sensitivity troponin 4.  Cova-19 PCR negative.  Influenza A/B PCR negative.  RSV negative.  Chest x-ray with cardiomegaly, slightly increased interstitial prominence reflective of edema versus viral/atypical infection.  Assessment & Plan:   Acute hypoxic respiratory failure, POA Influenza A viral infection Patient presenting to ED with progressive shortness of breath, dry cough and muscle aches.  Was initially diagnosed with influenza A on 12/27 but given length of symptoms was deemed outside the treatment window for Tamiflu.  On arrival to the ED patient was afebrile without leukocytosis but notably hypoxic on room air with SpO2 86%.  Chest x-ray with no focal consolidation but notable for interstitial prominence consistent with viral/atypical infection. -- Prednisone 40 mg p.o. daily -- DuoNebs every 6 hours -- Albuterol neb every 2 hours as needed shortness of breath/wheezing -- Mucinex 600 mg p.o. twice daily -- Continue supplemental oxygen, maintain SpO2  greater than 92% -- Supportive care, antitussives, flutter valve, incentive spirometry  Essential hypertension BP 101/64 this morning. -- Hold home spironolactone --Continue aspirin -- Continue monitor BP closely  Type 2 diabetes mellitus Hemoglobin A1c 6.6 on 07/06/2022, well-controlled. --SSI for coverage --CBGs qAC/HS  Dyslipidemia --Continue Zetia 10 mg p.o. daily  Morbid obesity Body mass index is 33.66 kg/m.  Discussed with patient needs for aggressive lifestyle changes/weight loss as this complicates all facets of care.  Outpatient follow-up with PCP.     DVT prophylaxis: enoxaparin (LOVENOX) injection 40 mg Start: 12/16/22 2200    Code Status: Full Code Family Communication: No family present at bedside this morning  Disposition Plan:  Level of care: Telemetry Medical Status is: Inpatient Remains inpatient appropriate because: Remains on supplemental oxygen, scheduled DuoNebs, anticipate discharge home in 1-2 days    Consultants:  None  Procedures:  None  Antimicrobials:  None   Subjective: Patient seen examined bedside, resting comfortably.  Remains in ED holding area.  Continues with mild shortness of breath, slightly improved this morning.  Remains on 2 L nasal cannula.  No other specific complaints or concerns at this time.  Patient requesting restart of Trulicity, not listed on her home medication list and awaiting pharmacy reconciliation.  Denies headache, no dizziness, no chest pain, no palpitations, no abdominal pain, no fever/chills/night sweats, no nausea/vomiting/diarrhea, no focal weakness, no fatigue, no paresthesias.  No acute events overnight per nursing staff.  Objective: Vitals:   12/17/22 0804 12/17/22 0930 12/17/22 1000 12/17/22 1030  BP: 131/85 (!) 144/79 131/73 136/85  Pulse: 76 71 63 63  Resp: 17 18  14 (!) 21  Temp: 98 F (36.7 C)     TempSrc:      SpO2: 100% 91% 96% 94%  Weight:      Height:       No intake or output data in the  24 hours ending 12/17/22 1147 Filed Weights   12/16/22 1342  Weight: 86.2 kg    Examination:  Physical Exam: GEN: NAD, alert and oriented x 3, obese HEENT: NCAT, PERRL, EOMI, sclera clear, MMM PULM: Breath sounds diminished bilateral bases, mild late expiratory wheezing throughout all lung fields, no crackles, normal respiratory effort without accessory muscle use, on 2 L nasal cannula CV: RRR w/o M/G/R GI: abd soft, NTND, NABS, no R/G/M MSK: no peripheral edema, muscle strength globally intact 5/5 bilateral upper/lower extremities NEURO: CN II-XII intact, no focal deficits, sensation to light touch intact PSYCH: normal mood/affect Integumentary: dry/intact, no rashes or wounds    Data Reviewed: I have personally reviewed following labs and imaging studies  CBC: Recent Labs  Lab 12/15/22 1548 12/16/22 1841  WBC 11.0* 9.1  NEUTROABS  --  6.0  HGB 17.3* 16.6*  HCT 53.9* 52.5*  MCV 96.4 98.7  PLT 162 109   Basic Metabolic Panel: Recent Labs  Lab 12/15/22 1548 12/16/22 1841  NA 136 139  K 3.8 3.6  CL 95* 97*  CO2 30 34*  GLUCOSE 151* 112*  BUN 15 16  CREATININE 0.70 0.58  CALCIUM 8.6* 8.7*   GFR: Estimated Creatinine Clearance: 75.9 mL/min (by C-G formula based on SCr of 0.58 mg/dL). Liver Function Tests: No results for input(s): "AST", "ALT", "ALKPHOS", "BILITOT", "PROT", "ALBUMIN" in the last 168 hours. No results for input(s): "LIPASE", "AMYLASE" in the last 168 hours. No results for input(s): "AMMONIA" in the last 168 hours. Coagulation Profile: No results for input(s): "INR", "PROTIME" in the last 168 hours. Cardiac Enzymes: No results for input(s): "CKTOTAL", "CKMB", "CKMBINDEX", "TROPONINI" in the last 168 hours. BNP (last 3 results) No results for input(s): "PROBNP" in the last 8760 hours. HbA1C: No results for input(s): "HGBA1C" in the last 72 hours. CBG: Recent Labs  Lab 12/17/22 0030 12/17/22 0717 12/17/22 1123  GLUCAP 138* 189* 197*    Lipid Profile: No results for input(s): "CHOL", "HDL", "LDLCALC", "TRIG", "CHOLHDL", "LDLDIRECT" in the last 72 hours. Thyroid Function Tests: No results for input(s): "TSH", "T4TOTAL", "FREET4", "T3FREE", "THYROIDAB" in the last 72 hours. Anemia Panel: No results for input(s): "VITAMINB12", "FOLATE", "FERRITIN", "TIBC", "IRON", "RETICCTPCT" in the last 72 hours. Sepsis Labs: Recent Labs  Lab 12/16/22 1841  PROCALCITON <0.10    Recent Results (from the past 240 hour(s))  Resp panel by RT-PCR (RSV, Flu A&B, Covid) Anterior Nasal Swab     Status: None   Collection Time: 12/16/22  6:41 PM   Specimen: Anterior Nasal Swab  Result Value Ref Range Status   SARS Coronavirus 2 by RT PCR NEGATIVE NEGATIVE Final    Comment: (NOTE) SARS-CoV-2 target nucleic acids are NOT DETECTED.  The SARS-CoV-2 RNA is generally detectable in upper respiratory specimens during the acute phase of infection. The lowest concentration of SARS-CoV-2 viral copies this assay can detect is 138 copies/mL. A negative result does not preclude SARS-Cov-2 infection and should not be used as the sole basis for treatment or other patient management decisions. A negative result may occur with  improper specimen collection/handling, submission of specimen other than nasopharyngeal swab, presence of viral mutation(s) within the areas targeted by this assay, and inadequate number of viral copies(<138  copies/mL). A negative result must be combined with clinical observations, patient history, and epidemiological information. The expected result is Negative.  Fact Sheet for Patients:  EntrepreneurPulse.com.au  Fact Sheet for Healthcare Providers:  IncredibleEmployment.be  This test is no t yet approved or cleared by the [place] FDA and  has been authorized for detection and/or diagnosis of SARS-CoV-2 by FDA under an Emergency Use Authorization (EUA). This EUA will remain  in  effect (meaning this test can be used) for the duration of the COVID-19 declaration under Section 564(b)(1) of the Act, 21 U.S.C.section 360bbb-3(b)(1), unless the authorization is terminated  or revoked sooner.       Influenza A by PCR NEGATIVE NEGATIVE Final   Influenza B by PCR NEGATIVE NEGATIVE Final    Comment: (NOTE) The Xpert Xpress SARS-CoV-2/FLU/RSV plus assay is intended as an aid in the diagnosis of influenza from Nasopharyngeal swab specimens and should not be used as a sole basis for treatment. Nasal washings and aspirates are unacceptable for Xpert Xpress SARS-CoV-2/FLU/RSV testing.  Fact Sheet for Patients: EntrepreneurPulse.com.au  Fact Sheet for Healthcare Providers: IncredibleEmployment.be  This test is not yet approved or cleared by the [place] FDA and has been authorized for detection and/or diagnosis of SARS-CoV-2 by FDA under an Emergency Use Authorization (EUA). This EUA will remain in effect (meaning this test can be used) for the duration of the COVID-19 declaration under Section 564(b)(1) of the Act, 21 U.S.C. section 360bbb-3(b)(1), unless the authorization is terminated or revoked.     Resp Syncytial Virus by PCR NEGATIVE NEGATIVE Final    Comment: (NOTE) Fact Sheet for Patients: EntrepreneurPulse.com.au  Fact Sheet for Healthcare Providers: IncredibleEmployment.be  This test is not yet approved or cleared by the [place] FDA and has been authorized for detection and/or diagnosis of SARS-CoV-2 by FDA under an Emergency Use Authorization (EUA). This EUA will remain in effect (meaning this test can be used) for the duration of the COVID-19 declaration under Section 564(b)(1) of the Act, 21 U.S.C. section 360bbb-3(b)(1), unless the authorization is terminated or revoked.  Performed at [facility], [address], [place], [place] [address]           Radiology Studies: DG Chest 2 View  Result Date: 12/16/2022 CLINICAL DATA:  Shortness of breath EXAM: CHEST - 2 VIEW COMPARISON:  Chest radiograph December 15, 2022. FINDINGS: Unchanged cardiac enlargement with central vascular congestion. Slightly increased interstitial prominence no visible pleural effusion or pneumothorax. The visualized skeletal structures are unchanged. IMPRESSION: Cardiomegaly with slightly increased interstitial prominence, may reflect edema or viral/atypical infection. Electronically Signed   By: Dahlia Bailiff M.D.   On: 12/16/2022 18:15   DG Chest 2 View  Result Date: 12/15/2022 CLINICAL DATA:  Shortness of breath EXAM: CHEST - 2 VIEW COMPARISON:  11/27/2020 FINDINGS: Heart is borderline in size. Mediastinal contours within normal limits. Interstitial prominence throughout the lungs. Bibasilar opacities. No effusions or acute bony abnormality. IMPRESSION: Interstitial prominence throughout the lungs could reflect interstitial edema or atypical infection. Bibasilar opacities, favor atelectasis. Electronically Signed   By: Rolm Baptise M.D.   On: 12/15/2022 16:11        Scheduled Meds:  albuterol  2 puff Inhalation Once   aspirin EC  81 mg Oral Daily   enoxaparin (LOVENOX) injection  40 mg Subcutaneous QHS   ezetimibe  10 mg Oral Daily   guaiFENesin  600 mg Oral BID   insulin aspart  0-9 Units Subcutaneous TID WC   ipratropium-albuterol  3 mL Nebulization Q6H   predniSONE  40 mg Oral Q breakfast   Continuous Infusions:   LOS: 1 day    Time spent: 52 minutes spent on chart review, discussion with nursing staff, consultants, updating family and interview/physical exam; more than 50% of that time was spent in counseling and/or coordination of care.    Briell Paulette J [place] DO [facility] Available via Epic secure chat 7am-7pm After these hours, please refer to coverage provider listed on [email or web address] 12/17/2022, 11:47 AM

## 2022-12-17 NOTE — Progress Notes (Signed)
Patient arrived to room from ED alert and oriented, no complaints of pain or SOB. IV in left arm SL. Patient is on droplet precautions. Wants to take a shower. No concerns noted at this time.

## 2022-12-17 NOTE — ED Notes (Signed)
Assumed care from [place], [place]. Pt resting comfortably in bed at this time. Pt requesting pain medications. Call light with in reach.

## 2022-12-17 NOTE — Progress Notes (Signed)
Responded to Spiritual Care consult. Met with patient who requested prayer. She shared about her recent illness as well as the determination to get better for her grandson to watch him grow up. Prayed with patient as well as shared words of encouragement.

## 2022-12-17 NOTE — ED Notes (Addendum)
Pt requesting her home med of trelegy. MD [place] notified.

## 2022-12-18 DIAGNOSIS — J09X1 Influenza due to identified novel influenza A virus with pneumonia: Secondary | ICD-10-CM | POA: Diagnosis not present

## 2022-12-18 LAB — GLUCOSE, CAPILLARY
Glucose-Capillary: 108 mg/dL — ABNORMAL HIGH (ref 70–99)
Glucose-Capillary: 154 mg/dL — ABNORMAL HIGH (ref 70–99)
Glucose-Capillary: 146 mg/dL — ABNORMAL HIGH (ref 70–99)
Glucose-Capillary: 165 mg/dL — ABNORMAL HIGH (ref 70–99)

## 2022-12-18 LAB — HIV ANTIBODY (ROUTINE TESTING W REFLEX): HIV Screen 4th Generation wRfx: NONREACTIVE

## 2022-12-18 NOTE — Progress Notes (Signed)
PROGRESS NOTE    Gloria Jones  ALP:[id number] DOB: July 08, 1961 DOA: 12/16/2022 PCP: [facility]    Brief Narrative:   Gloria Jones is a 61 y.o. female with past medical history significant for essential pretension, type 2 diabetes mellitus, history of COVID-19 viral infection recently diagnosed with influenza A on 12/27 who presented to [facility] ED on 12/28 with progressive shortness of breath.  Patient reports dry cough, muscle aches.  Denies fever, no vomiting, no diarrhea, no abdominal pain.  She was seen at urgent care day prior to admission and was told she was outside the treatment window for Tamiflu.  She has not been getting good relief with her inhalers.  In the ED, temperature 98.9 F, HR 79, RR 22, BP 109/79, SpO2 86% on room air.  Sodium 139, potassium 3.6, chloride 97, CO2 34, glucose 112, BUN 16, creatinine 0.58.  WBC 9.1, hemoglobin 16.6, platelets 157.  BNP 18.6.  Procalcitonin less than 0.10.  High sensitivity troponin 4.  Cova-19 PCR negative.  Influenza A/B PCR negative.  RSV negative.  Chest x-ray with cardiomegaly, slightly increased interstitial prominence reflective of edema versus viral/atypical infection.  Assessment & Plan:   Acute hypoxic respiratory failure, POA Influenza A viral infection COPD exacerbation Patient presenting to ED with progressive shortness of breath, dry cough and muscle aches.  Was initially diagnosed with influenza A on 12/27 but given length of symptoms was deemed outside the treatment window for Tamiflu.  On arrival to the ED patient was afebrile without leukocytosis but notably hypoxic on room air with SpO2 86%.  Chest x-ray with no focal consolidation but notable for interstitial prominence consistent with viral/atypical infection. -- Prednisone 40 mg p.o. daily -- DuoNebs every 6 hours -- Albuterol neb every 2 hours as needed shortness of breath/wheezing -- Mucinex 600 mg p.o. twice daily -- Breo Ellipta and Incruse  Ellipta as hospital substitution for Trelegy -- Continue supplemental oxygen, maintain SpO2 greater than 92%; currently on 2 L nasal cannula; continue to wean oxygen as able -- Ambulatory O2 screen today -- Supportive care, antitussives, flutter valve, incentive spirometry  Essential hypertension BP 143/82 this morning. -- Hold home spironolactone -- Continue aspirin -- Continue monitor BP closely  Type 2 diabetes mellitus Hemoglobin A1c 6.6 on 07/06/2022, well-controlled. --SSI for coverage --CBGs qAC/HS  Dyslipidemia --Continue Zetia 10 mg p.o. daily  Morbid obesity Body mass index is 33.66 kg/m.  Discussed with patient needs for aggressive lifestyle changes/weight loss as this complicates all facets of care.  Outpatient follow-up with PCP.     DVT prophylaxis: enoxaparin (LOVENOX) injection 40 mg Start: 12/16/22 2200    Code Status: Full Code Family Communication: No family present at bedside this morning  Disposition Plan:  Level of care: Telemetry Medical Status is: Inpatient Remains inpatient appropriate because: Remains on supplemental oxygen, scheduled DuoNebs, anticipate discharge home in 1-2 days    Consultants:  None  Procedures:  None  Antimicrobials:  None   Subjective: Patient seen examined bedside, resting comfortably.  Lying in bed, no specific complaints this morning.  Remains on 2 L nasal cannula.  Asking about her blood pressure medicine this morning, discussed will continue to hold as her pressures were borderline hypotensive overnight.  No other specific questions or concerns at this time.  Denies headache, no dizziness, no chest pain, no palpitations, no abdominal pain, no fever/chills/night sweats, no nausea/vomiting/diarrhea, no focal weakness, no fatigue, no paresthesias.  No acute events overnight per nursing staff.  Objective: Vitals:  12/17/22 1600 12/17/22 1737 12/18/22 0736 12/18/22 0850  BP: 117/64 (!) 143/82  130/77  Pulse: 66 77 66  67  Resp: (!) 27 (!) '22 18 16  '$ Temp: 98.2 F (36.8 C) 99.9 F (37.7 C)  97.8 F (36.6 C)  TempSrc:      SpO2: 96% 94% 96% 98%  Weight:      Height:       No intake or output data in the 24 hours ending 12/18/22 1118 Filed Weights   12/16/22 1342  Weight: 86.2 kg    Examination:  Physical Exam: GEN: NAD, alert and oriented x 3, obese HEENT: NCAT, PERRL, EOMI, sclera clear, MMM PULM: Breath sounds diminished bilateral bases, mild late expiratory wheezing throughout all lung fields, no crackles, normal respiratory effort without accessory muscle use, on 2 L nasal cannula with SpO2 96% at rest CV: RRR w/o M/G/R GI: abd soft, NTND, NABS, no R/G/M MSK: no peripheral edema, muscle strength globally intact 5/5 bilateral upper/lower extremities NEURO: CN II-XII intact, no focal deficits, sensation to light touch intact PSYCH: normal mood/affect Integumentary: dry/intact, no rashes or wounds    Data Reviewed: I have personally reviewed following labs and imaging studies  CBC: Recent Labs  Lab 12/15/22 1548 12/16/22 1841  WBC 11.0* 9.1  NEUTROABS  --  6.0  HGB 17.3* 16.6*  HCT 53.9* 52.5*  MCV 96.4 98.7  PLT 162 035   Basic Metabolic Panel: Recent Labs  Lab 12/15/22 1548 12/16/22 1841  NA 136 139  K 3.8 3.6  CL 95* 97*  CO2 30 34*  GLUCOSE 151* 112*  BUN 15 16  CREATININE 0.70 0.58  CALCIUM 8.6* 8.7*   GFR: Estimated Creatinine Clearance: 75.9 mL/min (by C-G formula based on SCr of 0.58 mg/dL). Liver Function Tests: No results for input(s): "AST", "ALT", "ALKPHOS", "BILITOT", "PROT", "ALBUMIN" in the last 168 hours. No results for input(s): "LIPASE", "AMYLASE" in the last 168 hours. No results for input(s): "AMMONIA" in the last 168 hours. Coagulation Profile: No results for input(s): "INR", "PROTIME" in the last 168 hours. Cardiac Enzymes: No results for input(s): "CKTOTAL", "CKMB", "CKMBINDEX", "TROPONINI" in the last 168 hours. BNP (last 3 results) No  results for input(s): "PROBNP" in the last 8760 hours. HbA1C: No results for input(s): "HGBA1C" in the last 72 hours. CBG: Recent Labs  Lab 12/17/22 0030 12/17/22 0717 12/17/22 1123 12/17/22 1617 12/18/22 0855  GLUCAP 138* 189* 197* 150* 108*   Lipid Profile: No results for input(s): "CHOL", "HDL", "LDLCALC", "TRIG", "CHOLHDL", "LDLDIRECT" in the last 72 hours. Thyroid Function Tests: No results for input(s): "TSH", "T4TOTAL", "FREET4", "T3FREE", "THYROIDAB" in the last 72 hours. Anemia Panel: No results for input(s): "VITAMINB12", "FOLATE", "FERRITIN", "TIBC", "IRON", "RETICCTPCT" in the last 72 hours. Sepsis Labs: Recent Labs  Lab 12/16/22 1841  PROCALCITON <0.10    Recent Results (from the past 240 hour(s))  Resp panel by RT-PCR (RSV, Flu A&B, Covid) Anterior Nasal Swab     Status: None   Collection Time: 12/16/22  6:41 PM   Specimen: Anterior Nasal Swab  Result Value Ref Range Status   SARS Coronavirus 2 by RT PCR NEGATIVE NEGATIVE Final    Comment: (NOTE) SARS-CoV-2 target nucleic acids are NOT DETECTED.  The SARS-CoV-2 RNA is generally detectable in upper respiratory specimens during the acute phase of infection. The lowest concentration of SARS-CoV-2 viral copies this assay can detect is 138 copies/mL. A negative result does not preclude SARS-Cov-2 infection and should not be used as the  sole basis for treatment or other patient management decisions. A negative result may occur with  improper specimen collection/handling, submission of specimen other than nasopharyngeal swab, presence of viral mutation(s) within the areas targeted by this assay, and inadequate number of viral copies(<138 copies/mL). A negative result must be combined with clinical observations, patient history, and epidemiological information. The expected result is Negative.  Fact Sheet for Patients:  EntrepreneurPulse.com.au  Fact Sheet for Healthcare Providers:   IncredibleEmployment.be  This test is no t yet approved or cleared by the [place] FDA and  has been authorized for detection and/or diagnosis of SARS-CoV-2 by FDA under an Emergency Use Authorization (EUA). This EUA will remain  in effect (meaning this test can be used) for the duration of the COVID-19 declaration under Section 564(b)(1) of the Act, 21 U.S.C.section 360bbb-3(b)(1), unless the authorization is terminated  or revoked sooner.       Influenza A by PCR NEGATIVE NEGATIVE Final   Influenza B by PCR NEGATIVE NEGATIVE Final    Comment: (NOTE) The Xpert Xpress SARS-CoV-2/FLU/RSV plus assay is intended as an aid in the diagnosis of influenza from Nasopharyngeal swab specimens and should not be used as a sole basis for treatment. Nasal washings and aspirates are unacceptable for Xpert Xpress SARS-CoV-2/FLU/RSV testing.  Fact Sheet for Patients: EntrepreneurPulse.com.au  Fact Sheet for Healthcare Providers: IncredibleEmployment.be  This test is not yet approved or cleared by the [place] FDA and has been authorized for detection and/or diagnosis of SARS-CoV-2 by FDA under an Emergency Use Authorization (EUA). This EUA will remain in effect (meaning this test can be used) for the duration of the COVID-19 declaration under Section 564(b)(1) of the Act, 21 U.S.C. section 360bbb-3(b)(1), unless the authorization is terminated or revoked.     Resp Syncytial Virus by PCR NEGATIVE NEGATIVE Final    Comment: (NOTE) Fact Sheet for Patients: EntrepreneurPulse.com.au  Fact Sheet for Healthcare Providers: IncredibleEmployment.be  This test is not yet approved or cleared by the [place] FDA and has been authorized for detection and/or diagnosis of SARS-CoV-2 by FDA under an Emergency Use Authorization (EUA). This EUA will remain in effect (meaning this test can be used) for  the duration of the COVID-19 declaration under Section 564(b)(1) of the Act, 21 U.S.C. section 360bbb-3(b)(1), unless the authorization is terminated or revoked.  Performed at [facility], [address] [place], [place] [address]          Radiology Studies: DG Chest 2 View  Result Date: 12/16/2022 CLINICAL DATA:  Shortness of breath EXAM: CHEST - 2 VIEW COMPARISON:  Chest radiograph December 15, 2022. FINDINGS: Unchanged cardiac enlargement with central vascular congestion. Slightly increased interstitial prominence no visible pleural effusion or pneumothorax. The visualized skeletal structures are unchanged. IMPRESSION: Cardiomegaly with slightly increased interstitial prominence, may reflect edema or viral/atypical infection. Electronically Signed   By: Dahlia Bailiff M.D.   On: 12/16/2022 18:15        Scheduled Meds:  albuterol  2 puff Inhalation Once   aspirin EC  81 mg Oral Daily   enoxaparin (LOVENOX) injection  40 mg Subcutaneous QHS   ezetimibe  10 mg Oral Daily   fluticasone furoate-vilanterol  1 puff Inhalation Daily   And   umeclidinium bromide  1 puff Inhalation Daily   guaiFENesin  600 mg Oral BID   insulin aspart  0-9 Units Subcutaneous TID WC   ipratropium-albuterol  3 mL Nebulization Q6H   predniSONE  40 mg Oral Q breakfast  Continuous Infusions:   LOS: 2 days    Time spent: 49 minutes spent on chart review, discussion with nursing staff, consultants, updating family and interview/physical exam; more than 50% of that time was spent in counseling and/or coordination of care.    Alany Borman J [place] DO [facility] Available via Epic secure chat 7am-7pm After these hours, please refer to coverage provider listed on [email or web address] 12/18/2022, 11:18 AM

## 2022-12-19 DIAGNOSIS — J09X1 Influenza due to identified novel influenza A virus with pneumonia: Secondary | ICD-10-CM | POA: Diagnosis not present

## 2022-12-19 LAB — GLUCOSE, CAPILLARY: Glucose-Capillary: 87 mg/dL (ref 70–99)

## 2022-12-19 MED ORDER — AZITHROMYCIN 500 MG PO TABS: 500.0000 mg | ORAL_TABLET | Freq: Every day | ORAL | 0 refills | Status: AC

## 2022-12-19 MED ORDER — IPRATROPIUM-ALBUTEROL 0.5-2.5 (3) MG/3ML IN SOLN: 3.0000 mL | Freq: Three times a day (TID) | RESPIRATORY_TRACT | Status: DC

## 2022-12-19 MED ORDER — IPRATROPIUM-ALBUTEROL 0.5-2.5 (3) MG/3ML IN SOLN: 3.0000 mL | Freq: Four times a day (QID) | RESPIRATORY_TRACT | 2 refills | Status: DC | PRN

## 2022-12-19 MED ORDER — PREDNISONE 10 MG PO TABS: ORAL_TABLET | ORAL | 0 refills | Status: AC

## 2022-12-19 MED ORDER — GUAIFENESIN ER 600 MG PO TB12: 600.0000 mg | ORAL_TABLET | Freq: Two times a day (BID) | ORAL | 0 refills | Status: AC

## 2022-12-19 NOTE — Discharge Summary (Signed)
Physician Discharge Summary  Gloria Jones QQI:[id number] DOB: [birth year] DOA: 12/16/2022  PCP: [facility]  Admit date: 12/16/2022 Discharge date: 12/19/2022  Admitted From: Home Disposition: Home  Recommendations for Outpatient Follow-up:  Follow up with PCP in 1-2 weeks Continue prednisone taper on discharge Started on azithromycin for mild COPD exacerbation  Home Health: No Equipment/Devices: Has home O2 already set up to use as needed  Discharge Condition: Stable CODE STATUS: Full code Diet recommendation: Heart healthy diet  History of present illness:  Gloria Jones is a 61 y.o. female with past medical history significant for essential pretension, type 2 diabetes mellitus, history of COVID-19 viral infection recently diagnosed with influenza A on 12/27 who presented to [facility] ED on 12/28 with progressive shortness of breath.  Patient reports dry cough, muscle aches.  Denies fever, no vomiting, no diarrhea, no abdominal pain.  She was seen at urgent care day prior to admission and was told she was outside the treatment window for Tamiflu.  She has not been getting good relief with her inhalers.   In the ED, temperature 98.9 F, HR 79, RR 22, BP 109/79, SpO2 86% on room air. Sodium 139, potassium 3.6, chloride 97, CO2 34, glucose 112, BUN 16, creatinine 0.58.  WBC 9.1, hemoglobin 16.6, platelets 157.  BNP 18.6.  Procalcitonin less than 0.10.  High sensitivity troponin 4.  Cova-19 PCR negative.  Influenza A/B PCR negative.  RSV negative.  Chest x-ray with cardiomegaly, slightly increased interstitial prominence reflective of edema versus viral/atypical infection.  Hospital course:  Acute hypoxic respiratory failure, POA Influenza A viral infection COPD exacerbation Patient presenting to ED with progressive shortness of breath, dry cough and muscle aches.  Was initially diagnosed with influenza A on 12/27 but given length of symptoms was deemed outside the  treatment window for Tamiflu.  On arrival to the ED patient was afebrile without leukocytosis but notably hypoxic on room air with SpO2 86%.  Chest x-ray with no focal consolidation but notable for interstitial prominence consistent with viral/atypical infection.  Patient was started on steroids, scheduled nebs and was initially requiring supplemental oxygen during her hospitalization.  Patient's symptoms have markedly improved and was titrated off of supplemental oxygen.  Patient was ambulated off of oxygen with no significant desaturations below 89%, goal greater than 88% with underlying COPD.  Patient will continue azithromycin, prednisone taper on discharge.  Continue home Trelegy inhaler.  Patient requesting refill of home nebulizer medications.  Patient has home supplemental oxygen already in house from previous COVID-19 viral infection.  Outpatient follow-up with PCP.  Essential hypertension Continue spironolactone 25 mg p.o. daily   Type 2 diabetes mellitus Hemoglobin A1c 6.6 on 07/06/2022, well-controlled with diet alone.   Dyslipidemia Continue Zetia 10 mg p.o. daily   Morbid obesity Body mass index is 33.66 kg/m.  Discussed with patient needs for aggressive lifestyle changes/weight loss as this complicates all facets of care.  Outpatient follow-up with PCP.    Discharge Diagnoses:  Principal Problem:   Influenza A with pneumonia Active Problems:   COPD with acute exacerbation ([place])   Acute respiratory failure with hypoxia ([place])   Essential (primary) hypertension   Prediabetes    Discharge Instructions  Discharge Instructions     Call MD for:  difficulty breathing, headache or visual disturbances   Complete by: As directed    Call MD for:  extreme fatigue   Complete by: As directed    Call MD for:  persistant dizziness or light-headedness  Complete by: As directed    Call MD for:  persistant nausea and vomiting   Complete by: As directed    Call MD for:  severe  uncontrolled pain   Complete by: As directed    Call MD for:  temperature >100.4   Complete by: As directed    Diet - low sodium heart healthy   Complete by: As directed    Increase activity slowly   Complete by: As directed       Allergies as of 12/19/2022       Reactions   Simvastatin    Other reaction(s): Muscle Pain   Statins    Other reaction(s): Other (See Comments) arthralgia   Fish Allergy    Lisinopril    Other reaction(s): Cough   Losartan    Other reaction(s): Headache   Pravastatin Other (See Comments)        Medication List     STOP taking these medications    benzonatate 100 MG capsule Commonly known as: TESSALON   ipratropium 0.06 % nasal spray Commonly known as: ATROVENT   promethazine-dextromethorphan 6.25-15 MG/5ML syrup Commonly known as: PROMETHAZINE-DM       TAKE these medications    albuterol 108 (90 Base) MCG/ACT inhaler Commonly known as: VENTOLIN HFA Inhale 1-2 puffs into the lungs every 4 (four) hours as needed for wheezing or shortness of breath.   aspirin EC 81 MG tablet Take 81 mg by mouth daily. Swallow whole.   azithromycin 500 MG tablet Commonly known as: Zithromax Take 1 tablet (500 mg total) by mouth daily for 5 days.   DULoxetine 30 MG capsule Commonly known as: CYMBALTA Take 30 mg by mouth 2 (two) times daily.   ezetimibe 10 MG tablet Commonly known as: ZETIA Take 1 tablet by mouth daily.   guaiFENesin 600 MG 12 hr tablet Commonly known as: Mucinex Take 1 tablet (600 mg total) by mouth 2 (two) times daily for 7 days.   ipratropium-albuterol 0.5-2.5 (3) MG/3ML Soln Commonly known as: DUONEB Take 3 mLs by nebulization every 6 (six) hours as needed.   predniSONE 10 MG tablet Commonly known as: DELTASONE Take 4 tablets (40 mg total) by mouth daily for 2 days, THEN 3 tablets (30 mg total) daily for 2 days, THEN 2 tablets (20 mg total) daily for 2 days, THEN 1 tablet (10 mg total) daily for 2 days. Start  taking on: December 20, 2022   spironolactone 25 MG tablet Commonly known as: ALDACTONE Take by mouth.   Trelegy Ellipta 100-62.5-25 MCG/ACT Aepb Generic drug: Fluticasone-Umeclidin-Vilant Inhale into the lungs.   Vitamin B12 1000 MCG Tbcr Take by mouth.        Follow-up Information     [place], [facility]. Schedule an appointment as soon as possible for a visit in 1 week(s).   Contact information: [place] [address] [phone]                Allergies  Allergen Reactions   Simvastatin     Other reaction(s): Muscle Pain   Statins     Other reaction(s): Other (See Comments) arthralgia   Fish Allergy    Lisinopril     Other reaction(s): Cough   Losartan     Other reaction(s): Headache   Pravastatin Other (See Comments)    Consultations: None   Procedures/Studies: DG Chest 2 View  Result Date: 12/16/2022 CLINICAL DATA:  Shortness of breath EXAM: CHEST - 2 VIEW COMPARISON:  Chest radiograph December 15, 2022. FINDINGS: Unchanged cardiac enlargement with central vascular congestion. Slightly increased interstitial prominence no visible pleural effusion or pneumothorax. The visualized skeletal structures are unchanged. IMPRESSION: Cardiomegaly with slightly increased interstitial prominence, may reflect edema or viral/atypical infection. Electronically Signed   By: Dahlia Bailiff M.D.   On: 12/16/2022 18:15   DG Chest 2 View  Result Date: 12/15/2022 CLINICAL DATA:  Shortness of breath EXAM: CHEST - 2 VIEW COMPARISON:  11/27/2020 FINDINGS: Heart is borderline in size. Mediastinal contours within normal limits. Interstitial prominence throughout the lungs. Bibasilar opacities. No effusions or acute bony abnormality. IMPRESSION: Interstitial prominence throughout the lungs could reflect interstitial edema or atypical infection. Bibasilar opacities, favor atelectasis. Electronically Signed   By: Rolm Baptise M.D.   On: 12/15/2022 16:11      Subjective: Patient seen examined bedside, resting comfortably.  Sitting at edge of bed.  Reports breathing much improved, has removed her oxygen.  Ambulated in the room with SpO2 maintaining greater than 89% with a goal of greater than 88%.  Patient reports that she has a home oxygen unit already set up in her house from previous COVID-19 viral infection.  Patient wishes to discharge home today.  No other questions or concerns at this time.  Denies headache, no dizziness, no chest pain, no palpitations, no fever/chills/night sweats, no nausea/vomiting/diarrhea, no focal weakness, no fatigue, no congestion, no paresthesias.  No acute events overnight per nursing staff.  Discharge Exam: Vitals:   12/19/22 0531 12/19/22 0809  BP: 134/79 (!) 140/89  Pulse: 60 60  Resp: 18   Temp: 98.2 F (36.8 C) 98 F (36.7 C)  SpO2: 98% 95%   Vitals:   12/18/22 1700 12/18/22 2023 12/19/22 0531 12/19/22 0809  BP: (!) 142/87 130/79 134/79 (!) 140/89  Pulse: 77 70 60 60  Resp: '18 18 18   '$ Temp: 98.3 F (36.8 C) 98 F (36.7 C) 98.2 F (36.8 C) 98 F (36.7 C)  TempSrc: Oral     SpO2:  95% 98% 95%  Weight:      Height:        Physical Exam: GEN: NAD, alert and oriented x 3, obese HEENT: NCAT, PERRL, EOMI, sclera clear, MMM PULM: Breath sounds slightly diminished bilateral bases, no wheezes/crackles, normal respiratory effort on room air CV: RRR w/o M/G/R GI: abd soft, NTND, NABS, no R/G/M MSK: no peripheral edema, muscle strength globally intact 5/5 bilateral upper/lower extremities NEURO: CN II-XII intact, no focal deficits, sensation to light touch intact PSYCH: normal mood/affect Integumentary: dry/intact, no rashes or wounds    The results of significant diagnostics from this hospitalization (including imaging, microbiology, ancillary and laboratory) are listed below for reference.     Microbiology: Recent Results (from the past 240 hour(s))  Resp panel by RT-PCR (RSV, Flu A&B,  Covid) Anterior Nasal Swab     Status: None   Collection Time: 12/16/22  6:41 PM   Specimen: Anterior Nasal Swab  Result Value Ref Range Status   SARS Coronavirus 2 by RT PCR NEGATIVE NEGATIVE Final    Comment: (NOTE) SARS-CoV-2 target nucleic acids are NOT DETECTED.  The SARS-CoV-2 RNA is generally detectable in upper respiratory specimens during the acute phase of infection. The lowest concentration of SARS-CoV-2 viral copies this assay can detect is 138 copies/mL. A negative result does not preclude SARS-Cov-2 infection and should not be used as the sole basis for treatment or other patient management decisions. A negative result may occur with  improper specimen  collection/handling, submission of specimen other than nasopharyngeal swab, presence of viral mutation(s) within the areas targeted by this assay, and inadequate number of viral copies(<138 copies/mL). A negative result must be combined with clinical observations, patient history, and epidemiological information. The expected result is Negative.  Fact Sheet for Patients:  EntrepreneurPulse.com.au  Fact Sheet for Healthcare Providers:  IncredibleEmployment.be  This test is no t yet approved or cleared by the [place] FDA and  has been authorized for detection and/or diagnosis of SARS-CoV-2 by FDA under an Emergency Use Authorization (EUA). This EUA will remain  in effect (meaning this test can be used) for the duration of the COVID-19 declaration under Section 564(b)(1) of the Act, 21 U.S.C.section 360bbb-3(b)(1), unless the authorization is terminated  or revoked sooner.       Influenza A by PCR NEGATIVE NEGATIVE Final   Influenza B by PCR NEGATIVE NEGATIVE Final    Comment: (NOTE) The Xpert Xpress SARS-CoV-2/FLU/RSV plus assay is intended as an aid in the diagnosis of influenza from Nasopharyngeal swab specimens and should not be used as a sole basis for treatment. Nasal  washings and aspirates are unacceptable for Xpert Xpress SARS-CoV-2/FLU/RSV testing.  Fact Sheet for Patients: EntrepreneurPulse.com.au  Fact Sheet for Healthcare Providers: IncredibleEmployment.be  This test is not yet approved or cleared by the [place] FDA and has been authorized for detection and/or diagnosis of SARS-CoV-2 by FDA under an Emergency Use Authorization (EUA). This EUA will remain in effect (meaning this test can be used) for the duration of the COVID-19 declaration under Section 564(b)(1) of the Act, 21 U.S.C. section 360bbb-3(b)(1), unless the authorization is terminated or revoked.     Resp Syncytial Virus by PCR NEGATIVE NEGATIVE Final    Comment: (NOTE) Fact Sheet for Patients: EntrepreneurPulse.com.au  Fact Sheet for Healthcare Providers: IncredibleEmployment.be  This test is not yet approved or cleared by the [place] FDA and has been authorized for detection and/or diagnosis of SARS-CoV-2 by FDA under an Emergency Use Authorization (EUA). This EUA will remain in effect (meaning this test can be used) for the duration of the COVID-19 declaration under Section 564(b)(1) of the Act, 21 U.S.C. section 360bbb-3(b)(1), unless the authorization is terminated or revoked.  Performed at [facility], [address] [place], [place] [address]      Labs: BNP (last 3 results) Recent Labs    12/16/22 1841  BNP 12.4   Basic Metabolic Panel: Recent Labs  Lab 12/15/22 1548 12/16/22 1841  NA 136 139  K 3.8 3.6  CL 95* 97*  CO2 30 34*  GLUCOSE 151* 112*  BUN 15 16  CREATININE 0.70 0.58  CALCIUM 8.6* 8.7*   Liver Function Tests: No results for input(s): "AST", "ALT", "ALKPHOS", "BILITOT", "PROT", "ALBUMIN" in the last 168 hours. No results for input(s): "LIPASE", "AMYLASE" in the last 168 hours. No results for input(s): "AMMONIA" in the last 168  hours. CBC: Recent Labs  Lab 12/15/22 1548 12/16/22 1841  WBC 11.0* 9.1  NEUTROABS  --  6.0  HGB 17.3* 16.6*  HCT 53.9* 52.5*  MCV 96.4 98.7  PLT 162 157   Cardiac Enzymes: No results for input(s): "CKTOTAL", "CKMB", "CKMBINDEX", "TROPONINI" in the last 168 hours. BNP: Invalid input(s): "POCBNP" CBG: Recent Labs  Lab 12/18/22 0855 12/18/22 1130 12/18/22 1707 12/18/22 2024 12/19/22 0807  GLUCAP 108* 154* 146* 165* 87   D-Dimer No results for input(s): "DDIMER" in the last 72 hours. Hgb A1c No results for input(s): "HGBA1C" in the  last 72 hours. Lipid Profile No results for input(s): "CHOL", "HDL", "LDLCALC", "TRIG", "CHOLHDL", "LDLDIRECT" in the last 72 hours. Thyroid function studies No results for input(s): "TSH", "T4TOTAL", "T3FREE", "THYROIDAB" in the last 72 hours.  Invalid input(s): "FREET3" Anemia work up No results for input(s): "VITAMINB12", "FOLATE", "FERRITIN", "TIBC", "IRON", "RETICCTPCT" in the last 72 hours. Urinalysis    Component Value Date/Time   COLORURINE YELLOW 08/10/2022 1102   APPEARANCEUR CLEAR 08/10/2022 [place] 09/29/2014 1514   LABSPEC 1.020 08/10/2022 1102   LABSPEC 1.020 09/29/2014 1514   PHURINE 5.5 08/10/2022 1102   GLUCOSEU NEGATIVE 08/10/2022 1102   GLUCOSEU NEGATIVE 09/29/2014 1514   HGBUR MODERATE (A) 08/10/2022 1102   BILIRUBINUR NEGATIVE 08/10/2022 1102   BILIRUBINUR NEGATIVE 09/29/2014 1514   KETONESUR NEGATIVE 08/10/2022 1102   PROTEINUR 100 (A) 08/10/2022 1102   NITRITE NEGATIVE 08/10/2022 1102   LEUKOCYTESUR SMALL (A) 08/10/2022 1102   LEUKOCYTESUR NEGATIVE 09/29/2014 1514   Sepsis Labs Recent Labs  Lab 12/15/22 1548 12/16/22 1841  WBC 11.0* 9.1   Microbiology Recent Results (from the past 240 hour(s))  Resp panel by RT-PCR (RSV, Flu A&B, Covid) Anterior Nasal Swab     Status: None   Collection Time: 12/16/22  6:41 PM   Specimen: Anterior Nasal Swab  Result Value Ref Range Status   SARS  Coronavirus 2 by RT PCR NEGATIVE NEGATIVE Final    Comment: (NOTE) SARS-CoV-2 target nucleic acids are NOT DETECTED.  The SARS-CoV-2 RNA is generally detectable in upper respiratory specimens during the acute phase of infection. The lowest concentration of SARS-CoV-2 viral copies this assay can detect is 138 copies/mL. A negative result does not preclude SARS-Cov-2 infection and should not be used as the sole basis for treatment or other patient management decisions. A negative result may occur with  improper specimen collection/handling, submission of specimen other than nasopharyngeal swab, presence of viral mutation(s) within the areas targeted by this assay, and inadequate number of viral copies(<138 copies/mL). A negative result must be combined with clinical observations, patient history, and epidemiological information. The expected result is Negative.  Fact Sheet for Patients:  EntrepreneurPulse.com.au  Fact Sheet for Healthcare Providers:  IncredibleEmployment.be  This test is no t yet approved or cleared by the [place] FDA and  has been authorized for detection and/or diagnosis of SARS-CoV-2 by FDA under an Emergency Use Authorization (EUA). This EUA will remain  in effect (meaning this test can be used) for the duration of the COVID-19 declaration under Section 564(b)(1) of the Act, 21 U.S.C.section 360bbb-3(b)(1), unless the authorization is terminated  or revoked sooner.       Influenza A by PCR NEGATIVE NEGATIVE Final   Influenza B by PCR NEGATIVE NEGATIVE Final    Comment: (NOTE) The Xpert Xpress SARS-CoV-2/FLU/RSV plus assay is intended as an aid in the diagnosis of influenza from Nasopharyngeal swab specimens and should not be used as a sole basis for treatment. Nasal washings and aspirates are unacceptable for Xpert Xpress SARS-CoV-2/FLU/RSV testing.  Fact Sheet for  Patients: EntrepreneurPulse.com.au  Fact Sheet for Healthcare Providers: IncredibleEmployment.be  This test is not yet approved or cleared by the [place] FDA and has been authorized for detection and/or diagnosis of SARS-CoV-2 by FDA under an Emergency Use Authorization (EUA). This EUA will remain in effect (meaning this test can be used) for the duration of the COVID-19 declaration under Section 564(b)(1) of the Act, 21 U.S.C. section 360bbb-3(b)(1), unless the authorization is terminated or revoked.  Resp Syncytial Virus by PCR NEGATIVE NEGATIVE Final    Comment: (NOTE) Fact Sheet for Patients: EntrepreneurPulse.com.au  Fact Sheet for Healthcare Providers: IncredibleEmployment.be  This test is not yet approved or cleared by the [place] FDA and has been authorized for detection and/or diagnosis of SARS-CoV-2 by FDA under an Emergency Use Authorization (EUA). This EUA will remain in effect (meaning this test can be used) for the duration of the COVID-19 declaration under Section 564(b)(1) of the Act, 21 U.S.C. section 360bbb-3(b)(1), unless the authorization is terminated or revoked.  Performed at [facility], [address], [place], [place] [address]      Time coordinating discharge: Over 30 minutes  SIGNED:   Robinn Overholt J [place] DO  [facility] 12/19/2022, 11:08 AM

## 2022-12-19 NOTE — TOC Transition Note (Addendum)
Transition of Care [facility]) - CM/SW Discharge Note   Patient Details  Name: Gloria Jones MRN: [id number] Date of Birth: 1961/05/12  [facility] CM/SW Contact:  Valente David, RN Phone Number: 12/19/2022, 12:17 PM   Clinical Narrative:     Patient admitted from home, [place] in [facility] for routine primary care.  Uses [facility] in [place] for medications.  She will have a friend provide transportation home.  Report she is having a hard time paying for Trellegy.  She is also interested in applying for Medicaid for better coverage for medication cost. Emailed link to apply for Medicaid (epass.uMourn.cz) as well as link for medication assistance for Trellegy.  No further TOC needs at this time.   Final next level of care: Home/Self Care Barriers to Discharge: Barriers Resolved   Patient Goals and CMS Choice      Discharge Placement                         Discharge Plan and Services Additional resources added to the After Visit Summary for                                       Social Determinants of Health (SDOH) Interventions SDOH Screenings   Food Insecurity: No Food Insecurity (12/17/2022)  Housing: Low Risk  (12/17/2022)  Transportation Needs: No Transportation Needs (12/17/2022)  Utilities: Not At Risk (12/17/2022)  Tobacco Use: High Risk (12/17/2022)     Readmission Risk Interventions     No data to display

## 2022-12-19 NOTE — Progress Notes (Signed)
Received Md order to discharge patient to home, reviewed home meds, prescriptions, follow up appointments and discharge instructions with patient and patient verbalized understanding  

## 2022-12-21 LAB — HEMOGLOBIN A1C
Hgb A1c MFr Bld: 6.5 % — ABNORMAL HIGH (ref 4.8–5.6)
Mean Plasma Glucose: 140 mg/dL

## 2022-12-27 ENCOUNTER — Other Ambulatory Visit: Payer: Self-pay

## 2022-12-27 ENCOUNTER — Emergency Department: Payer: BC Managed Care – PPO

## 2022-12-27 DIAGNOSIS — R0602 Shortness of breath: Secondary | ICD-10-CM | POA: Insufficient documentation

## 2022-12-27 DIAGNOSIS — J449 Chronic obstructive pulmonary disease, unspecified: Secondary | ICD-10-CM | POA: Diagnosis not present

## 2022-12-27 DIAGNOSIS — I10 Essential (primary) hypertension: Secondary | ICD-10-CM | POA: Insufficient documentation

## 2022-12-27 DIAGNOSIS — Z1152 Encounter for screening for COVID-19: Secondary | ICD-10-CM | POA: Diagnosis not present

## 2022-12-27 LAB — COMPREHENSIVE METABOLIC PANEL
ALT: 47 U/L — ABNORMAL HIGH (ref 0–44)
AST: 26 U/L (ref 15–41)
Albumin: 3.7 g/dL (ref 3.5–5.0)
Alkaline Phosphatase: 67 U/L (ref 38–126)
Anion gap: 9 (ref 5–15)
BUN: 26 mg/dL — ABNORMAL HIGH (ref 8–23)
CO2: 32 mmol/L (ref 22–32)
Calcium: 8.9 mg/dL (ref 8.9–10.3)
Chloride: 96 mmol/L — ABNORMAL LOW (ref 98–111)
Creatinine, Ser: 0.82 mg/dL (ref 0.44–1.00)
GFR, Estimated: >60 mL/min (ref >60)
Glucose, Bld: 148 mg/dL — ABNORMAL HIGH (ref 70–99)
Potassium: 4.9 mmol/L (ref 3.5–5.1)
Sodium: 137 mmol/L (ref 135–145)
Total Bilirubin: 0.9 mg/dL (ref 0.3–1.2)
Total Protein: 7.1 g/dL (ref 6.5–8.1)

## 2022-12-27 LAB — CBC
HCT: 53 % — ABNORMAL HIGH (ref 36.0–46.0)
Hemoglobin: 16.4 g/dL — ABNORMAL HIGH (ref 12.0–15.0)
MCH: 30.8 pg (ref 26.0–34.0)
MCHC: 30.9 g/dL (ref 30.0–36.0)
MCV: 99.4 fL (ref 80.0–100.0)
Platelets: 308 10*3/uL (ref 150–400)
RBC: 5.33 MIL/uL — ABNORMAL HIGH (ref 3.87–5.11)
RDW: 11.9 % (ref 11.5–15.5)
WBC: 14.9 10*3/uL — ABNORMAL HIGH (ref 4.0–10.5)
nRBC: 0 % (ref 0.0–0.2)

## 2022-12-27 LAB — TROPONIN I (HIGH SENSITIVITY): Troponin I (High Sensitivity): 14 ng/L (ref <18)

## 2022-12-27 LAB — RESP PANEL BY RT-PCR (RSV, FLU A&B, COVID)  RVPGX2
Influenza A by PCR: NEGATIVE
Influenza B by PCR: NEGATIVE
Resp Syncytial Virus by PCR: NEGATIVE
SARS Coronavirus 2 by RT PCR: NEGATIVE

## 2022-12-27 NOTE — ED Triage Notes (Signed)
Patient arrived via POV reporting hypoxia and shortness of breath worse with exertion. States she had the flu then pneumonia and was discharged December 31st. Reports headache and back pain. States she wears 2L per [place] intermittently but arrived to hospital on room air with oxygen saturation in 80's. Patient denies fever or chills. Speaking in full clear sentences.

## 2022-12-27 NOTE — ED Provider Triage Note (Signed)
Emergency Medicine Provider Triage Evaluation Note  Gloria Jones , a 62 y.o. female  was evaluated in triage.  Pt complains of shortness of breath, headache, and back pain. On 2l oxygen at home after ARDS and pneumonia in late December.   Physical Exam  BP 120/85 (BP Location: Right Arm)   Pulse 90   Temp 99.2 F (37.3 C) (Oral)   Resp 20   Ht '5\' 3"'$  (1.6 m)   Wt 86.2 kg   LMP 02/08/2015   SpO2 93%   BMI 33.66 kg/m  Gen:   Awake, no distress   Resp:  Normal effort  MSK:   Moves extremities without difficulty  Other:    Medical Decision Making  Medically screening exam initiated at 7:08 PM.  Appropriate orders placed.  Sacred Roa was informed that the remainder of the evaluation will be completed by another provider, this initial triage assessment does not replace that evaluation, and the importance of remaining in the ED until their evaluation is complete.  93% on 2liters. Protocols started.   Victorino Dike, FNP 12/27/22 1912

## 2022-12-27 NOTE — ED Notes (Addendum)
Pt RA sats 82% placed on 2L. Pt is always on 2L but did not bring oxygen tank with her d/t it being almost empty. Pt now on 3L- 93 %

## 2022-12-28 ENCOUNTER — Emergency Department
Admission: EM | Admit: 2022-12-28 | Discharge: 2022-12-28 | Disposition: A | Discharge status: Home / Self Care | Payer: BC Managed Care – PPO | Attending: Emergency Medicine | Admitting: Emergency Medicine

## 2022-12-28 ENCOUNTER — Encounter: Admission: RE | Payer: Self-pay | Source: Home / Self Care

## 2022-12-28 ENCOUNTER — Ambulatory Visit: Admission: RE | Admit: 2022-12-28 | Payer: BC Managed Care – PPO | Source: Home / Self Care

## 2022-12-28 ENCOUNTER — Emergency Department: Payer: BC Managed Care – PPO

## 2022-12-28 DIAGNOSIS — R0602 Shortness of breath: Secondary | ICD-10-CM

## 2022-12-28 DIAGNOSIS — R911 Solitary pulmonary nodule: Secondary | ICD-10-CM

## 2022-12-28 LAB — TROPONIN I (HIGH SENSITIVITY): Troponin I (High Sensitivity): 12 ng/L (ref <18)

## 2022-12-28 SURGERY — COLONOSCOPY WITH PROPOFOL
Anesthesia: General

## 2022-12-28 MED ORDER — IPRATROPIUM-ALBUTEROL 0.5-2.5 (3) MG/3ML IN SOLN
3.0000 mL | Freq: Once | RESPIRATORY_TRACT | Status: AC
  Administered 2022-12-28: 3 mL via RESPIRATORY_TRACT
  Filled 2022-12-28: qty 3

## 2022-12-28 MED ORDER — ACETAMINOPHEN 500 MG PO TABS
1000.0000 mg | ORAL_TABLET | Freq: Once | ORAL | Status: AC
  Administered 2022-12-28: 1000 mg via ORAL
  Filled 2022-12-28: qty 2

## 2022-12-28 MED ORDER — IOHEXOL 350 MG/ML SOLN
75.0000 mL | Freq: Once | INTRAVENOUS | Status: AC | PRN
  Administered 2022-12-28: 75 mL via INTRAVENOUS

## 2022-12-28 MED ORDER — KETOROLAC TROMETHAMINE 15 MG/ML IJ SOLN
15.0000 mg | Freq: Once | INTRAMUSCULAR | Status: AC
  Administered 2022-12-28: 15 mg via INTRAVENOUS
  Filled 2022-12-28: qty 1

## 2022-12-28 NOTE — Discharge Instructions (Addendum)
Your CT scan scan did not show a blood clot.  You do have a new pulmonary nodule this will need to be followed up with a repeat CAT scan, which should be done in about 6 months.

## 2022-12-28 NOTE — ED Provider Notes (Signed)
Patient was signed out to me pending a CTA.  This is a 62 year old female with history of COPD who presents with acute onset of worsening dyspnea.  Did not have any wheezing on exam.  CTA is negative for PE.  There is a new pulmonary nodule which radiology says could be infectious or inflammatory.  Patient has no cough no fever.  Discussed findings with the patient.  Recommended repeat CT in about 6 months to evaluate for progression.  She is saturating well on her baseline oxygen.  She is appropriate for discharge.   Rada Hay, MD 12/28/22 [phone]

## 2022-12-28 NOTE — ED Provider Notes (Signed)
[facility] Provider Note    Event Date/Time   First MD Initiated Contact with Patient 12/28/22 [phone]     (approximate)   History   Chief Complaint: Hypoxia and Shortness of Breath   HPI  Gloria Jones is a 62 y.o. female with a history of COPD, hypertension, recent hospitalization for influenza and COPD exacerbation, discharged 12/19/2022.  Who complains of worsening shortness of breath today.  Has increased shortness of breath with exertion.  No orthopnea.  No lower extremity edema.  No chest pain or fever.  Does also complain of diffuse lower back pain.  No syncope, no cough.     Physical Exam   Triage Vital Signs: ED Triage Vitals  Enc Vitals Group     BP 12/27/22 1900 120/85     Pulse Rate 12/27/22 1900 90     Resp 12/27/22 1900 20     Temp 12/27/22 1900 99.2 F (37.3 C)     Temp Source 12/27/22 1900 Oral     SpO2 12/27/22 1900 93 %     Weight 12/27/22 1900 190 lb (86.2 kg)     Height 12/27/22 1900 '5\' 3"'$  (1.6 m)     Head Circumference --      Peak Flow --      Pain Score 12/27/22 1908 10     Pain Loc --      Pain Edu? --      Excl. in [place]? --     Most recent vital signs: Vitals:   12/27/22 1900 12/28/22 0602  BP: 120/85 (!) 143/90  Pulse: 90 88  Resp: 20 (!) 22  Temp: 99.2 F (37.3 C)   SpO2: 93% 91%    General: Awake, no distress.  CV:  Good peripheral perfusion.  Regular rate and rhythm Resp:  Normal effort.  Clear to auscultation bilaterally, normal expiratory phase.  No wheezing or crackles. Abd:  No distention.  Soft nontender Other:  No lower extremity edema.  Symmetric calf circumference.  Cholesterol mucosa.   ED Results / Procedures / Treatments   Labs (all labs ordered are listed, but only abnormal results are displayed) Labs Reviewed  CBC - Abnormal; Notable for the following components:      Result Value   WBC 14.9 (*)    RBC 5.33 (*)    Hemoglobin 16.4 (*)    HCT 53.0 (*)    All other components within  normal limits  COMPREHENSIVE METABOLIC PANEL - Abnormal; Notable for the following components:   Chloride 96 (*)    Glucose, Bld 148 (*)    BUN 26 (*)    ALT 47 (*)    All other components within normal limits  RESP PANEL BY RT-PCR (RSV, FLU A&B, COVID)  RVPGX2  URINALYSIS, ROUTINE W REFLEX MICROSCOPIC  TROPONIN I (HIGH SENSITIVITY)  TROPONIN I (HIGH SENSITIVITY)     EKG Interpreted by me Sinus rhythm rate of 89.  Rightward axis.  Poor R wave progression.  Normal ST segments and T waves.   RADIOLOGY Chest x-ray interpreted by me, appears unremarkable except for tiny pleural effusions.  No edema effusion or pneumonia.  Radiology report reviewed.  CT angiogram chest pending   PROCEDURES:  Procedures   MEDICATIONS ORDERED IN ED: Medications  ketorolac (TORADOL) 15 MG/ML injection 15 mg (has no administration in time range)  ipratropium-albuterol (DUONEB) 0.5-2.5 (3) MG/3ML nebulizer solution 3 mL (has no administration in time range)  iohexol (OMNIPAQUE) 350 MG/ML injection 75 mL (  has no administration in time range)  acetaminophen (TYLENOL) tablet 1,000 mg (1,000 mg Oral Given 12/28/22 0600)     IMPRESSION / MDM / ASSESSMENT AND PLAN / ED COURSE  I reviewed the triage vital signs and the nursing notes.                              Differential diagnosis includes, but is not limited to, pneumonia, pleural effusion, pulmonary edema, NSTEMI, electrolyte abnormality, anemia, viral illness  Patient's presentation is most consistent with acute presentation with potential threat to life or bodily function.  Patient with recent hospitalization comes ED reporting worsening shortness of breath.  Oxygenation is stable on her chronic nasal cannula oxygen.  Not in distress, exam reassuring.  Labs unremarkable.  Initial troponin normal.  Will trend the troponin.  With recent hospitalization and increased shortness of breath symptoms, will obtain a CT angiogram to rule out PE.  Will give  DuoNeb.  If repeat troponin and CT scan are reassuring, I think patient would not require admission and could be discharged home for outpatient follow-up.       FINAL CLINICAL IMPRESSION(S) / ED DIAGNOSES   Final diagnoses:  SOB (shortness of breath)     Rx / DC Orders   ED Discharge Orders     None        Note:  This document was prepared using Dragon voice recognition software and may include unintentional dictation errors.   Carrie Mew, MD 12/28/22 [phone]

## 2023-02-19 ENCOUNTER — Ambulatory Visit: Admit: 2023-02-19 | Discharge status: Against Medical Advice | Payer: BC Managed Care – PPO

## 2023-03-25 ENCOUNTER — Other Ambulatory Visit: Payer: Self-pay

## 2023-03-25 ENCOUNTER — Ambulatory Visit: Payer: BC Managed Care – PPO | Admitting: Certified Registered Nurse Anesthetist

## 2023-03-25 ENCOUNTER — Ambulatory Visit
Admission: RE | Admit: 2023-03-25 | Discharge: 2023-03-25 | Disposition: A | Discharge status: Home / Self Care | Payer: BC Managed Care – PPO | Attending: Gastroenterology | Admitting: Gastroenterology

## 2023-03-25 ENCOUNTER — Encounter
Admission: RE | Disposition: A | Discharge status: Home / Self Care | Payer: Self-pay | Source: Home / Self Care | Attending: Gastroenterology

## 2023-03-25 ENCOUNTER — Encounter: Payer: Self-pay | Admitting: *Deleted

## 2023-03-25 DIAGNOSIS — Z9981 Dependence on supplemental oxygen: Secondary | ICD-10-CM | POA: Insufficient documentation

## 2023-03-25 DIAGNOSIS — K640 First degree hemorrhoids: Secondary | ICD-10-CM | POA: Insufficient documentation

## 2023-03-25 DIAGNOSIS — K5730 Diverticulosis of large intestine without perforation or abscess without bleeding: Secondary | ICD-10-CM | POA: Diagnosis not present

## 2023-03-25 DIAGNOSIS — D123 Benign neoplasm of transverse colon: Secondary | ICD-10-CM | POA: Insufficient documentation

## 2023-03-25 DIAGNOSIS — I10 Essential (primary) hypertension: Secondary | ICD-10-CM | POA: Diagnosis not present

## 2023-03-25 DIAGNOSIS — R7303 Prediabetes: Secondary | ICD-10-CM | POA: Insufficient documentation

## 2023-03-25 DIAGNOSIS — Z1211 Encounter for screening for malignant neoplasm of colon: Secondary | ICD-10-CM | POA: Insufficient documentation

## 2023-03-25 DIAGNOSIS — Z800 Family history of malignant neoplasm of digestive organs: Secondary | ICD-10-CM | POA: Diagnosis not present

## 2023-03-25 DIAGNOSIS — J449 Chronic obstructive pulmonary disease, unspecified: Secondary | ICD-10-CM | POA: Insufficient documentation

## 2023-03-25 DIAGNOSIS — Z86010 Personal history of colonic polyps: Secondary | ICD-10-CM | POA: Diagnosis not present

## 2023-03-25 HISTORY — DX: Other nonspecific abnormal finding of lung field: R91.8

## 2023-03-25 HISTORY — DX: Respiratory failure, unspecified, unspecified whether with hypoxia or hypercapnia: J96.90

## 2023-03-25 HISTORY — PX: COLONOSCOPY WITH PROPOFOL: SHX5780

## 2023-03-25 SURGERY — COLONOSCOPY WITH PROPOFOL
Anesthesia: General

## 2023-03-25 MED ORDER — SODIUM CHLORIDE 0.9 % IV SOLN: INTRAVENOUS | Status: DC

## 2023-03-25 MED ORDER — PROPOFOL 10 MG/ML IV BOLUS
INTRAVENOUS | Status: AC
  Filled 2023-03-25: qty 40

## 2023-03-25 MED ORDER — PROPOFOL 500 MG/50ML IV EMUL
INTRAVENOUS | Status: DC | PRN
  Administered 2023-03-25 (×3): 50 mg via INTRAVENOUS
  Administered 2023-03-25: 100 ug/kg/min via INTRAVENOUS

## 2023-03-25 NOTE — Transfer of Care (Signed)
Immediate Anesthesia Transfer of Care Note  Patient: Gloria Jones  Procedure(s) Performed: COLONOSCOPY WITH PROPOFOL  Patient Location: PACU  Anesthesia Type:MAC  Level of Consciousness: awake, alert , and oriented  Airway & Oxygen Therapy: Patient Spontanous Breathing  Post-op Assessment: Report given to RN and Post -op Vital signs reviewed and stable  Post vital signs: Reviewed and stable  Last Vitals:  Vitals Value Taken Time  BP    Temp    Pulse    Resp    SpO2      Last Pain:  Vitals:   03/25/23 0937  TempSrc: Temporal  PainSc: 0-No pain         Complications: No notable events documented.

## 2023-03-25 NOTE — H&P (Signed)
Outpatient short stay form Pre-procedure 03/25/2023  Regis Bill, MD  Primary Physician: [facility]  Reason for visit:  Surveillance  History of present illness:    62 y/o lady with severe COPD here for surveillance colonoscopy. No blood thinners. Last colonoscopy in 2021 with poor prep but colonoscopy in 2018 with adenomatous polyps. No significant abdominal surgeries. Had an uncle with colon cancer.    Current Facility-Administered Medications:    0.9 %  sodium chloride infusion, , Intravenous, Continuous, Amritha Yorke, Rossie Muskrat, MD, Last Rate: 20 mL/hr at 03/25/23 0947, New Bag at 03/25/23 0947  Medications Prior to Admission  Medication Sig Dispense Refill Last Dose   albuterol (PROVENTIL HFA;VENTOLIN HFA) 108 (90 BASE) MCG/ACT inhaler Inhale 1-2 puffs into the lungs every 4 (four) hours as needed for wheezing or shortness of breath. 1 Inhaler 0 03/25/2023   Fluticasone-Umeclidin-Vilant (TRELEGY ELLIPTA) 100-62.5-25 MCG/ACT AEPB Inhale into the lungs.   03/24/2023   aspirin EC 81 MG tablet Take 81 mg by mouth daily. Swallow whole.      Cyanocobalamin (VITAMIN B12) 1000 MCG TBCR Take by mouth.   03/23/2023   DULoxetine (CYMBALTA) 30 MG capsule Take 30 mg by mouth 2 (two) times daily.   03/23/2023   ezetimibe (ZETIA) 10 MG tablet Take 1 tablet by mouth daily.   03/23/2023   ipratropium-albuterol (DUONEB) 0.5-2.5 (3) MG/3ML SOLN Take 3 mLs by nebulization every 6 (six) hours as needed. 360 mL 2    spironolactone (ALDACTONE) 25 MG tablet Take by mouth.   03/23/2023     Allergies  Allergen Reactions   Simvastatin     Other reaction(s): Muscle Pain   Statins     Other reaction(s): Other (See Comments) arthralgia   Fish Allergy    Lisinopril     Other reaction(s): Cough   Losartan     Other reaction(s): Headache   Pravastatin Other (See Comments)     Past Medical History:  Diagnosis Date   Allergic state    Asthma    Bronchitis    COPD (chronic obstructive  pulmonary disease)    History of kidney stones    Hypertension    Pre-diabetes    Pulmonary nodules    Respiratory failure    chronic o2 2liters    Review of systems:  Otherwise negative.    Physical Exam  Gen: Alert, oriented. Appears stated age.  HEENT: PERRLA. Lungs: No respiratory distress CV: RRR Abd: soft, benign, no masses Ext: No edema    Planned procedures: Proceed with colonoscopy. The patient understands the nature of the planned procedure, indications, risks, alternatives and potential complications including but not limited to bleeding, infection, perforation, damage to internal organs and possible oversedation/side effects from anesthesia. The patient agrees and gives consent to proceed.  Please refer to procedure notes for findings, recommendations and patient disposition/instructions.     Regis Bill, MD [facility]

## 2023-03-25 NOTE — Op Note (Signed)
[facility] Patient Name: Gloria ResidesMargie Mccombs Procedure Date: 03/25/2023 9:56 AM MRN: [id number] Account #: [id number] Date of Birth: 20-Feb-1961 Admit Type: Outpatient Age: 62 [facility] Gender: Female Note Status: Finalized Instrument Name: Prentice DockerColonoscope [id number] Procedure:             Colonoscopy Indications:           Surveillance: History of adenomatous polyps,                         inadequate prep on last exam (<6132yr) Providers:             Eather Colasameron Charl Wellen MD, MD Referring MD:          [facility] (Referring MD) Medicines:             Monitored Anesthesia Care Complications:         No immediate complications. Procedure:             Pre-Anesthesia Assessment:                        - Prior to the procedure, a History and Physical was                         performed, and patient medications and allergies were                         reviewed. The patient is competent. The risks and                         benefits of the procedure and the sedation options and                         risks were discussed with the patient. All questions                         were answered and informed consent was obtained.                         Patient identification and proposed procedure were                         verified by the physician, the nurse, the                         anesthesiologist, the anesthetist and the technician                         in the endoscopy suite. Mental Status Examination:                         alert and oriented. Airway Examination: normal                         oropharyngeal airway and neck mobility. Respiratory                         Examination: clear to auscultation. CV Examination:  normal. Prophylactic Antibiotics: The patient does not                         require prophylactic antibiotics. Prior                         Anticoagulants: The patient has taken no  anticoagulant                         or antiplatelet agents. ASA Grade Assessment: III - A                         patient with severe systemic disease. After reviewing                         the risks and benefits, the patient was deemed in                         satisfactory condition to undergo the procedure. The                         anesthesia plan was to use monitored anesthesia care                         (MAC). Immediately prior to administration of                         medications, the patient was re-assessed for adequacy                         to receive sedatives. The heart rate, respiratory                         rate, oxygen saturations, blood pressure, adequacy of                         pulmonary ventilation, and response to care were                         monitored throughout the procedure. The physical                         status of the patient was re-assessed after the                         procedure.                        After obtaining informed consent, the colonoscope was                         passed under direct vision. Throughout the procedure,                         the patient's blood pressure, pulse, and oxygen                         saturations were monitored continuously. The  Colonoscope was introduced through the anus and                         advanced to the the ileocecal valve. The colonoscopy                         was somewhat difficult due to restricted mobility of                         the colon. Successful completion of the procedure was                         aided by withdrawing the scope and replacing with the                         pediatric colonoscope. The patient tolerated the                         procedure well. The quality of the bowel preparation                         was good except the ascending colon was poor. The                         ileocecal valve and the rectum were  photographed. Findings:      The perianal and digital rectal examinations were normal.      A polypoid lesion was found in the cecum. The lesion was sessile. Unable       to assess how large the lesion was due to extremely poor prep. Even       after extensive washing, the material was stuck to the wall. A polyp was       also seen in the ascending but not removed due to poor prep.      A 3 mm polyp was found in the transverse colon. The polyp was sessile.       The polyp was removed with a cold snare. Resection and retrieval were       complete. Estimated blood loss was minimal.      A few small-mouthed diverticula were found in the sigmoid colon,       descending colon and transverse colon.      Internal hemorrhoids were found during retroflexion. The hemorrhoids       were Grade I (internal hemorrhoids that do not prolapse). Impression:            - Polypoid lesion in the cecum.                        - One 3 mm polyp in the transverse colon, removed with                         a cold snare. Resected and retrieved.                        - Diverticulosis in the sigmoid colon, in the                         descending colon and in the transverse colon.                        -  Internal hemorrhoids. Recommendation:        - Repeat colonoscopy at next available appointment                         (within 3 months) because the bowel preparation was                         suboptimal.                        - Resume previous diet.                        - Continue present medications.                        - Return to referring physician as previously                         scheduled. Procedure Code(s):     --- Professional ---                        [phone], Colonoscopy, flexible; with removal of                         tumor(s), polyp(s), or other lesion(s) by snare                         technique Diagnosis Code(s):     --- Professional ---                        Z86.010, Personal  history of colonic polyps                        D49.0, Neoplasm of unspecified behavior of digestive                         system                        D12.3, Benign neoplasm of transverse colon (hepatic                         flexure or splenic flexure)                        K64.0, First degree hemorrhoids                        K57.30, Diverticulosis of large intestine without                         perforation or abscess without bleeding CPT copyright 2022 American Medical Association. All rights reserved. The codes documented in this report are preliminary and upon coder review may  be revised to meet current compliance requirements. Eather Colas MD, MD 03/25/2023 10:35:52 AM Number of Addenda: 0 Note Initiated On: 03/25/2023 9:56 AM Scope Withdrawal Time: 0 hours 2 minutes 58 seconds  Total Procedure Duration: 0 hours 18 minutes 27 seconds  Estimated Blood Loss:  Estimated blood loss: none.      [facility]

## 2023-03-25 NOTE — Anesthesia Postprocedure Evaluation (Signed)
Anesthesia Post Note  Patient: Gloria Jones  Procedure(s) Performed: COLONOSCOPY WITH PROPOFOL  Patient location during evaluation: PACU Anesthesia Type: General Level of consciousness: awake and oriented Pain management: pain level controlled Vital Signs Assessment: post-procedure vital signs reviewed and stable Respiratory status: spontaneous breathing and respiratory function stable Cardiovascular status: blood pressure returned to baseline Anesthetic complications: no   No notable events documented.   Last Vitals:  Vitals:   03/25/23 0937 03/25/23 1029  BP: (!) 155/92 (!) 155/90  Pulse: 72 94  Resp: 19 15  Temp: (!) 36.3 C (!) 35.9 C  SpO2: 98% 94%    Last Pain:  Vitals:   03/25/23 1029  TempSrc: Temporal  PainSc: Asleep                 VAN STAVEREN,Terrie Grajales

## 2023-03-25 NOTE — Anesthesia Preprocedure Evaluation (Signed)
Anesthesia Evaluation  Patient identified by MRN, date of birth, ID band Patient awake    Reviewed: Allergy & Precautions, NPO status , Patient's Chart, lab work & pertinent test results  Airway Mallampati: II  TM Distance: >3 FB Neck ROM: full    Dental  (+) Teeth Intact, Upper Dentures, Lower Dentures   Pulmonary neg pulmonary ROS, shortness of breath and with exertion, asthma , pneumonia, COPD,  oxygen dependent, Current Smoker and Patient abstained from smoking.   Pulmonary exam normal  + decreased breath sounds      Cardiovascular Exercise Tolerance: Poor hypertension, Pt. on medications negative cardio ROS Normal cardiovascular exam Rhythm:Regular Rate:Normal     Neuro/Psych negative neurological ROS  negative psych ROS   GI/Hepatic negative GI ROS, Neg liver ROS,,,  Endo/Other  negative endocrine ROS    Renal/GU negative Renal ROS  negative genitourinary   Musculoskeletal negative musculoskeletal ROS (+)    Abdominal  (+) + obese  Peds negative pediatric ROS (+)  Hematology negative hematology ROS (+)   Anesthesia Other Findings Past Medical History: No date: Allergic state No date: Asthma No date: Bronchitis No date: COPD (chronic obstructive pulmonary disease) No date: History of kidney stones No date: Hypertension No date: Pre-diabetes No date: Pulmonary nodules No date: Respiratory failure     Comment:  chronic o2 2liters  Past Surgical History: 06/28/2017: COLONOSCOPY WITH PROPOFOL; N/A     Comment:  Procedure: COLONOSCOPY WITH PROPOFOL;  Surgeon:               Christena Deem, MD;  Location: [facility] ENDOSCOPY;                Service: Endoscopy;  Laterality: N/A; 07/30/2022: CYSTOSCOPY/URETEROSCOPY/HOLMIUM LASER/STENT PLACEMENT;  Right     Comment:  Procedure: CYSTOSCOPY/URETEROSCOPY/HOLMIUM LASER/STENT               PLACEMENT;  Surgeon: Sondra Come, MD;  Location:               [facility]  ORS;  Service: Urology;  Laterality: Right; 06/28/2017: ESOPHAGOGASTRODUODENOSCOPY (EGD) WITH PROPOFOL; N/A     Comment:  Procedure: ESOPHAGOGASTRODUODENOSCOPY (EGD) WITH               PROPOFOL;  Surgeon: Christena Deem, MD;  Location:               [facility] ENDOSCOPY;  Service: Endoscopy;  Laterality: N/A; No date: KNEE SURGERY; Left     Comment:  meniscus repair 12/1977: OVARY SURGERY; Left     Comment:  partial ovary and fallopian tube removed     Reproductive/Obstetrics negative OB ROS                             Anesthesia Physical Anesthesia Plan  ASA: 3  Anesthesia Plan: General   Post-op Pain Management:    Induction: Intravenous  PONV Risk Score and Plan: Propofol infusion and TIVA  Airway Management Planned: Natural Airway  Additional Equipment:   Intra-op Plan:   Post-operative Plan:   Informed Consent: I have reviewed the patients History and Physical, chart, labs and discussed the procedure including the risks, benefits and alternatives for the proposed anesthesia with the patient or authorized representative who has indicated his/her understanding and acceptance.     Dental Advisory Given  Plan Discussed with: CRNA and Surgeon  Anesthesia Plan Comments:        Anesthesia Quick Evaluation

## 2023-03-25 NOTE — Interval H&P Note (Signed)
History and Physical Interval Note:  03/25/2023 9:56 AM  Gloria Jones  has presented today for surgery, with the diagnosis of HX OF ADENOMATOUS POLYP OF COLON.  The various methods of treatment have been discussed with the patient and family. After consideration of risks, benefits and other options for treatment, the patient has consented to  Procedure(s) with comments: COLONOSCOPY WITH PROPOFOL (N/A) - DM as a surgical intervention.  The patient's history has been reviewed, patient examined, no change in status, stable for surgery.  I have reviewed the patient's chart and labs.  Questions were answered to the patient's satisfaction.     Regis Bill  Ok to proceed with colonoscopy

## 2023-03-28 ENCOUNTER — Encounter: Payer: Self-pay | Admitting: Gastroenterology

## 2023-03-28 LAB — SURGICAL PATHOLOGY

## 2023-04-26 ENCOUNTER — Emergency Department: Payer: BC Managed Care – PPO

## 2023-04-26 ENCOUNTER — Other Ambulatory Visit: Payer: Self-pay

## 2023-04-26 ENCOUNTER — Emergency Department
Admission: EM | Admit: 2023-04-26 | Discharge: 2023-04-26 | Disposition: A | Discharge status: Home / Self Care | Payer: BC Managed Care – PPO | Attending: Emergency Medicine | Admitting: Emergency Medicine

## 2023-04-26 DIAGNOSIS — J441 Chronic obstructive pulmonary disease with (acute) exacerbation: Secondary | ICD-10-CM | POA: Insufficient documentation

## 2023-04-26 DIAGNOSIS — M545 Low back pain, unspecified: Secondary | ICD-10-CM | POA: Diagnosis not present

## 2023-04-26 DIAGNOSIS — R0602 Shortness of breath: Secondary | ICD-10-CM | POA: Diagnosis present

## 2023-04-26 LAB — BASIC METABOLIC PANEL
Anion gap: 13 (ref 5–15)
BUN: 14 mg/dL (ref 8–23)
CO2: 27 mmol/L (ref 22–32)
Calcium: 9 mg/dL (ref 8.9–10.3)
Chloride: 98 mmol/L (ref 98–111)
Creatinine, Ser: 0.51 mg/dL (ref 0.44–1.00)
GFR, Estimated: >60 mL/min (ref >60)
Glucose, Bld: 141 mg/dL — ABNORMAL HIGH (ref 70–99)
Potassium: 3.4 mmol/L — ABNORMAL LOW (ref 3.5–5.1)
Sodium: 138 mmol/L (ref 135–145)

## 2023-04-26 LAB — CBC
HCT: 52.5 % — ABNORMAL HIGH (ref 36.0–46.0)
Hemoglobin: 16.8 g/dL — ABNORMAL HIGH (ref 12.0–15.0)
MCH: 31.3 pg (ref 26.0–34.0)
MCHC: 32 g/dL (ref 30.0–36.0)
MCV: 97.9 fL (ref 80.0–100.0)
Platelets: 213 10*3/uL (ref 150–400)
RBC: 5.36 MIL/uL — ABNORMAL HIGH (ref 3.87–5.11)
RDW: 12.2 % (ref 11.5–15.5)
WBC: 12.4 10*3/uL — ABNORMAL HIGH (ref 4.0–10.5)
nRBC: 0 % (ref 0.0–0.2)

## 2023-04-26 MED ORDER — TRELEGY ELLIPTA 100-62.5-25 MCG/ACT IN AEPB: INHALATION_SPRAY | RESPIRATORY_TRACT | 0 refills | Status: DC

## 2023-04-26 MED ORDER — PREDNISONE 20 MG PO TABS
60.0000 mg | ORAL_TABLET | Freq: Once | ORAL | Status: AC
  Administered 2023-04-26: 60 mg via ORAL
  Filled 2023-04-26: qty 3

## 2023-04-26 MED ORDER — ALBUTEROL SULFATE (2.5 MG/3ML) 0.083% IN NEBU
3.0000 mL | INHALATION_SOLUTION | RESPIRATORY_TRACT | Status: DC | PRN
  Administered 2023-04-26: 3 mL via RESPIRATORY_TRACT
  Filled 2023-04-26: qty 3

## 2023-04-26 MED ORDER — IPRATROPIUM-ALBUTEROL 0.5-2.5 (3) MG/3ML IN SOLN
3.0000 mL | Freq: Once | RESPIRATORY_TRACT | Status: AC
  Administered 2023-04-26: 3 mL via RESPIRATORY_TRACT
  Filled 2023-04-26: qty 3

## 2023-04-26 MED ORDER — BENZONATATE 100 MG PO CAPS: 100.0000 mg | ORAL_CAPSULE | Freq: Three times a day (TID) | ORAL | 0 refills | Status: AC | PRN

## 2023-04-26 MED ORDER — OXYCODONE-ACETAMINOPHEN 5-325 MG PO TABS
1.0000 | ORAL_TABLET | Freq: Once | ORAL | Status: AC
  Administered 2023-04-26: 1 via ORAL
  Filled 2023-04-26: qty 1

## 2023-04-26 MED ORDER — DOXYCYCLINE HYCLATE 100 MG PO TABS
100.0000 mg | ORAL_TABLET | Freq: Once | ORAL | Status: AC
  Administered 2023-04-26: 100 mg via ORAL
  Filled 2023-04-26: qty 1

## 2023-04-26 MED ORDER — PREDNISONE 10 MG PO TABS: ORAL_TABLET | ORAL | 0 refills | Status: AC

## 2023-04-26 MED ORDER — ALBUTEROL SULFATE (2.5 MG/3ML) 0.083% IN NEBU: 2.5000 mg | INHALATION_SOLUTION | RESPIRATORY_TRACT | 1 refills | Status: AC | PRN

## 2023-04-26 MED ORDER — DOXYCYCLINE HYCLATE 100 MG PO CAPS: 100.0000 mg | ORAL_CAPSULE | Freq: Two times a day (BID) | ORAL | 0 refills | Status: AC

## 2023-04-26 NOTE — ED Notes (Signed)
EDP Isaacs at bedside reassessing pt.

## 2023-04-26 NOTE — ED Triage Notes (Signed)
Pt sts that she has been SOB for the last three days. Pt sts that she has COPD. Pt's work of breathing is pretty significant to her complaint.

## 2023-04-26 NOTE — ED Notes (Signed)
Pt alert, sitting calmly on stretcher, skin dry.

## 2023-04-26 NOTE — Discharge Instructions (Signed)
Use your ALBUTEROL inhaler or nebulizer - 2 puffs or 1 dose every 2 hours while awake for the next 24 hours, then every 4 hours for 24 hours, then every 4-6 hours as needed  Continue your TRELEGY  Take the oral prednisone course  Take the antibiotic

## 2023-04-26 NOTE — ED Notes (Addendum)
Pt reports intermittent/PRN use of O2 at home for COPD hx. Pt states inc WOB/SOB upon exertion. Pt's skin dry, sitting calmly on stretcher, pt's RR mildly inc but otherwise not obviously struggling to breath currently; no major accessory muscle use, nasal flaring, tripod position, etc noted. Pt reports back pain; states thinks this was brought on by her cough and the stretcher.

## 2023-04-26 NOTE — ED Notes (Signed)
EDP Isaacs notified via secure chat that pt requesting something for her back pain. Awaiting reply/orders.

## 2023-04-26 NOTE — ED Provider Notes (Signed)
[facility] Provider Note    Event Date/Time   First MD Initiated Contact with Patient 04/26/23 1529     (approximate)   History   Shortness of Breath   HPI  Gloria Jones is a 62 y.o. female with history of COPD, on intermittent home oxygen, here with shortness of breath.  The patient states that since Friday, she separatively worsening cough, wheezing, and shortness of breath.  She had been out of her albuterol just had it filled about 2 days ago.  She has been using it several times per day.  She has had persistent worsening wheezing and shortness of breath so she presents for evaluation.  Denies any chest pain.  She has had some increase sputum production with yellow-green, "nasty" sputum.  Denies any fevers.  No chest pain.  She does have some mild bilateral lower back pain after coughing, but only with coughing.  No lower extremity weakness or numbness.     Physical Exam   Triage Vital Signs: ED Triage Vitals  Enc Vitals Group     BP 04/26/23 1353 131/71     Pulse Rate 04/26/23 1353 95     Resp 04/26/23 1353 (!) 25     Temp 04/26/23 1353 98 F (36.7 C)     Temp Source 04/26/23 1353 Oral     SpO2 04/26/23 1353 (!) 85 %     Weight 04/26/23 1354 203 lb (92.1 kg)     Height --      Head Circumference --      Peak Flow --      Pain Score 04/26/23 1353 10     Pain Loc --      Pain Edu? --      Excl. in GC? --     Most recent vital signs: Vitals:   04/26/23 1357 04/26/23 1703  BP:  124/67  Pulse:  85  Resp:  19  Temp:    SpO2: 94% 91%     General: Awake, no distress.  CV:  Good peripheral perfusion.  Regular rate and rhythm. Resp:  Normal work of breathing.  Diffuse inspiratory and expiratory wheezes.  Normal work of breathing, however, speaking in full sentences. Abd:  No distention.  Other:  No lower extremity edema or asymmetry.   ED Results / Procedures / Treatments   Labs (all labs ordered are listed, but only abnormal  results are displayed) Labs Reviewed  BASIC METABOLIC PANEL - Abnormal; Notable for the following components:      Result Value   Potassium 3.4 (*)    Glucose, Bld 141 (*)    All other components within normal limits  CBC - Abnormal; Notable for the following components:   WBC 12.4 (*)    RBC 5.36 (*)    Hemoglobin 16.8 (*)    HCT 52.5 (*)    All other components within normal limits     EKG Normal sinus rhythm, trickle rate 92.  PR 162, QRS 90, QTc 464.  No acute ST elevations or depressions.  No acute evidence of acute ischemia or infarct.   RADIOLOGY Chest x-ray: Stable cardiomegaly with mild central vascular congestion   I also independently reviewed and agree with radiologist interpretations.   PROCEDURES:  Critical Care performed: No  .1-3 Lead EKG Interpretation  Performed by: Shaune Pollack, MD Authorized by: Shaune Pollack, MD     Interpretation: normal     ECG rate:  90-100   ECG rate assessment:  normal     Rhythm: sinus rhythm     Ectopy: none     Conduction: normal   Comments:     Indication: Chest pain     MEDICATIONS ORDERED IN ED: Medications  albuterol (PROVENTIL) (2.5 MG/3ML) 0.083% nebulizer solution 3 mL (3 mLs Nebulization Given 04/26/23 1557)  ipratropium-albuterol (DUONEB) 0.5-2.5 (3) MG/3ML nebulizer solution 3 mL (3 mLs Nebulization Given 04/26/23 1557)  ipratropium-albuterol (DUONEB) 0.5-2.5 (3) MG/3ML nebulizer solution 3 mL (3 mLs Nebulization Given 04/26/23 1557)  ipratropium-albuterol (DUONEB) 0.5-2.5 (3) MG/3ML nebulizer solution 3 mL (3 mLs Nebulization Given 04/26/23 1556)  predniSONE (DELTASONE) tablet 60 mg (60 mg Oral Given 04/26/23 1558)  doxycycline (VIBRA-TABS) tablet 100 mg (100 mg Oral Given 04/26/23 1558)  oxyCODONE-acetaminophen (PERCOCET/ROXICET) 5-325 MG per tablet 1 tablet (1 tablet Oral Given 04/26/23 1643)     IMPRESSION / MDM / ASSESSMENT AND PLAN / ED COURSE  I reviewed the triage vital signs and the nursing notes.                               Differential diagnosis includes, but is not limited to, COPD exacerbation, asthma, CAP, bronchitis, anemia, CHF  Patient's presentation is most consistent with acute presentation with potential threat to life or bodily function.  The patient is on the cardiac monitor to evaluate for evidence of arrhythmia and/or significant heart rate changes   62 yo well appearing female here with cough, wheezing, and shortness of breath.  The patient appears overall stable with normal work of breathing on my initial examination.  She has a long history of COPD and is supposed to be on chronic 2 to 3 L nasal cannula.  She is satting around 90% on this in the ED.  She was given 3 DuoNebs as well as a dose of prednisone and doxycycline for increased sputum production.  Chest x-ray is clear.  Lab work is otherwise reassuring.  No significant anemia above her baseline.  EKG is nonischemic.  Do not suspect ACS or CHF.   Following 3 DuoNeb's she has had essentially complete resolution of wheezing and is able to ambulate without difficulty and is speaking in full sentences.  She does remain borderline hypoxic with sats in the 90s, but this is her baseline.  She is adamant that she has oxygen at home and feels markedly improved and very much would like to attempt outpatient management.  She has a pulse ox at home as well.  Will have her use her nebulizer every 2 hours while awake for the next 24 hours, start the prednisone taper, and return as needed.  Return precautions given.   FINAL CLINICAL IMPRESSION(S) / ED DIAGNOSES   Final diagnoses:  COPD exacerbation (HCC)     Rx / DC Orders   ED Discharge Orders          Ordered    Fluticasone-Umeclidin-Vilant (TRELEGY ELLIPTA) 100-62.5-25 MCG/ACT AEPB        04/26/23 1713    predniSONE (DELTASONE) 10 MG tablet  Daily        04/26/23 1713    doxycycline (VIBRAMYCIN) 100 MG capsule  2 times daily        04/26/23 1713    benzonatate  (TESSALON PERLES) 100 MG capsule  3 times daily PRN        04/26/23 1713    albuterol (PROVENTIL) (2.5 MG/3ML) 0.083% nebulizer solution  Every 4 hours PRN  04/26/23 1713             Note:  This document was prepared using Dragon voice recognition software and may include unintentional dictation errors.   Shaune Pollack, MD 04/26/23 1719

## 2023-05-24 ENCOUNTER — Ambulatory Visit
Admission: EM | Admit: 2023-05-24 | Discharge: 2023-05-24 | Disposition: A | Discharge status: Home / Self Care | Payer: BC Managed Care – PPO | Attending: Physician Assistant | Admitting: Physician Assistant

## 2023-05-24 DIAGNOSIS — R040 Epistaxis: Secondary | ICD-10-CM

## 2023-05-24 DIAGNOSIS — M545 Low back pain, unspecified: Secondary | ICD-10-CM

## 2023-05-24 DIAGNOSIS — J449 Chronic obstructive pulmonary disease, unspecified: Secondary | ICD-10-CM

## 2023-05-24 DIAGNOSIS — R519 Headache, unspecified: Secondary | ICD-10-CM

## 2023-05-24 DIAGNOSIS — M542 Cervicalgia: Secondary | ICD-10-CM | POA: Diagnosis not present

## 2023-05-24 MED ORDER — IPRATROPIUM-ALBUTEROL 0.5-2.5 (3) MG/3ML IN SOLN
3.0000 mL | Freq: Once | RESPIRATORY_TRACT | Status: AC
  Administered 2023-05-24: 3 mL via RESPIRATORY_TRACT

## 2023-05-24 MED ORDER — PREDNISONE 20 MG PO TABS: 40.0000 mg | ORAL_TABLET | Freq: Every day | ORAL | 0 refills | Status: AC

## 2023-05-24 MED ORDER — HYDROCODONE-ACETAMINOPHEN 5-325 MG PO TABS: 1.0000 | ORAL_TABLET | Freq: Four times a day (QID) | ORAL | 0 refills | Status: AC | PRN

## 2023-05-24 NOTE — ED Triage Notes (Signed)
Pt reports back pain, HA and swelling around nose. HA x 4 days, has taken Aleve with no relief. Nosebleed only this morning.   Pt not wearing oxygen d/t nosebleed. States she normally sits around 50 with her COPD.

## 2023-05-24 NOTE — Discharge Instructions (Addendum)
-  We gave you a breathing treatment and oxygen increased a little.  You should use your supplemental oxygen at home and continue with your breathing treatments.  Also continue to try to stop smoking. - Call your pulmonologist for follow-up. - Since your headache has improved and your neurological exam was normal, you may hold off on going to the ED at this time.  However, if your headache worsens or is not starting to improve over the next 24 hours or you have any of the following red flag signs/symptoms (dizziness, worsening headache, increased neck pain, light sensitivity, numbness/tingling or weakness of your face or extremities, chest pain, racing heart, increased shortness of breath, balance or speech problems, feeling faint or passing out, more nosebleeds, vision changes) then you should go immediately to the emergency department or call EMS. - I sent Norco as needed for severe back pain and prednisone to help with your breathing.

## 2023-05-24 NOTE — ED Provider Notes (Signed)
[facility]    CSN: [id number] Arrival date & time: 05/24/23  1358      History   Chief Complaint Chief Complaint  Patient presents with   Headache   Epistaxis    This morning     HPI Gloria Jones is a 62 y.o. female with history of severe COPD, asthma, hypertension, tobacco abuse, chronic respiratory failure, and type 2 diabetes.  Today she is presenting for 4-day history of lower back pain, fatigue, and headaches. Headache is currently 7-8/10 but she says it was 20/10 yesterday. Also reports that her neck is sore. She has been taking Aleve without much relief. She says this is not the worst headache she has ever had. Says headaches are not new for her but she has never had a diagnosis of migraine. Denies pre-syncope, nausea or vomiting, limb weakness, confusion, vision changes, dizziness, gait or speech problems. Denies any recent falls or head injury.   Reports sinus congestion mildly. Denies fever, cough, sore throat, chest pain, or increased SOB from baseline. No abdominal pain.  Patient says she was having a coffee cigarette this morning and suddenly had a nosebleed. She says she put a napkin in her nose and it lasted about 6 minutes. Denies coughing or blowing nose before nosebleed.   Patient states that her normal oxygen saturation is between 88%- 93% and it is unchanged. She has supplemental oxygen with her today but is not using it currently due to recent nosebleed. No history of recurrent nosebleeds and denies bleeding from other sites. No use of anticoagulants.  Patient says she has had back pain before and she thinks her back pain has recently flared up.  She says her back is actually hurting her more right now than her head.  Back pain is all across lower back.  It does not radiate into extremities.  No loss of bowel or bladder control.  No relief with Aleve.  HPI  Past Medical History:  Diagnosis Date   Allergic state    Asthma    Bronchitis    COPD  (chronic obstructive pulmonary disease) (HCC)    History of kidney stones    Hypertension    Pre-diabetes    Pulmonary nodules    Respiratory failure (HCC)    chronic o2 2liters    Patient Active Problem List   Diagnosis Date Noted   Influenza A with pneumonia 12/16/2022   Acute respiratory failure with hypoxia (HCC) 12/16/2022   COPD with acute exacerbation (HCC) 12/22/2018   Prediabetes 05/06/2015   Essential (primary) hypertension 09/06/2014    Past Surgical History:  Procedure Laterality Date   COLONOSCOPY WITH PROPOFOL N/A 06/28/2017   Procedure: COLONOSCOPY WITH PROPOFOL;  Surgeon: Christena Deem, MD;  Location: [facility] ENDOSCOPY;  Service: Endoscopy;  Laterality: N/A;   COLONOSCOPY WITH PROPOFOL N/A 03/25/2023   Procedure: COLONOSCOPY WITH PROPOFOL;  Surgeon: Regis Bill, MD;  Location: [facility] ENDOSCOPY;  Service: Endoscopy;  Laterality: N/A;  DM   CYSTOSCOPY/URETEROSCOPY/HOLMIUM LASER/STENT PLACEMENT Right 07/30/2022   Procedure: CYSTOSCOPY/URETEROSCOPY/HOLMIUM LASER/STENT PLACEMENT;  Surgeon: Sondra Come, MD;  Location: [facility] ORS;  Service: Urology;  Laterality: Right;   ESOPHAGOGASTRODUODENOSCOPY (EGD) WITH PROPOFOL N/A 06/28/2017   Procedure: ESOPHAGOGASTRODUODENOSCOPY (EGD) WITH PROPOFOL;  Surgeon: Christena Deem, MD;  Location: [facility] ENDOSCOPY;  Service: Endoscopy;  Laterality: N/A;   KNEE SURGERY Left    meniscus repair   OVARY SURGERY Left 12/1977   partial ovary and fallopian tube removed    OB History  No obstetric history on file.      Home Medications    Prior to Admission medications   Medication Sig Start Date End Date Taking? Authorizing Provider  albuterol (PROVENTIL HFA;VENTOLIN HFA) 108 (90 BASE) MCG/ACT inhaler Inhale 1-2 puffs into the lungs every 4 (four) hours as needed for wheezing or shortness of breath. 05/24/15  Yes Betancourt, Jarold Song, NP  albuterol (PROVENTIL) (2.5 MG/3ML) 0.083% nebulizer solution Take 3 mLs (2.5 mg total) by  nebulization every 4 (four) hours as needed for wheezing or shortness of breath. 04/26/23 04/25/24 Yes Shaune Pollack, MD  aspirin EC 81 MG tablet Take 81 mg by mouth daily. Swallow whole.   Yes [provider]  Cyanocobalamin (VITAMIN B12) 1000 MCG TBCR Take by mouth.   Yes [provider]  DULoxetine (CYMBALTA) 30 MG capsule Take 30 mg by mouth 2 (two) times daily.   Yes [provider]  Fluticasone-Umeclidin-Vilant (TRELEGY ELLIPTA) 100-62.5-25 MCG/ACT AEPB Take as directed - one dose every 24 hours for COPD. 04/26/23  Yes Shaune Pollack, MD  ipratropium-albuterol (DUONEB) 0.5-2.5 (3) MG/3ML SOLN Take 3 mLs by nebulization every 6 (six) hours as needed. 12/19/22  Yes [place], Eric J, DO  spironolactone (ALDACTONE) 25 MG tablet Take by mouth. 12/27/18  Yes [provider]  benzonatate (TESSALON PERLES) 100 MG capsule Take 1 capsule (100 mg total) by mouth 3 (three) times daily as needed for cough. 04/26/23 04/25/24  Shaune Pollack, MD  ezetimibe (ZETIA) 10 MG tablet Take 1 tablet by mouth daily. 07/06/22 07/06/23  [provider]    Family History Family History  Problem Relation Age of Onset   Hypertension Mother    Colon cancer Maternal Uncle    Breast cancer Maternal Aunt        mat great aunt    Social History Social History   Tobacco Use   Smoking status: Every Day    Packs/day: 1    Types: Cigarettes    Passive exposure: Current   Smokeless tobacco: Never  Vaping Use   Vaping Use: Never used  Substance Use Topics   Alcohol use: Not Currently    Comment: occasionally,non last 24hrs   Drug use: Yes    Types: Marijuana    Comment: daily     Allergies   Simvastatin, Statins, Fish allergy, Lisinopril, Losartan, and Pravastatin   Review of Systems Review of Systems  Constitutional:  Positive for fatigue. Negative for chills, diaphoresis and fever.  HENT:  Positive for congestion, nosebleeds, rhinorrhea and sinus pressure. Negative for  ear pain, sinus pain and sore throat.   Eyes:  Negative for photophobia and visual disturbance.  Respiratory:  Positive for cough, shortness of breath and wheezing.   Cardiovascular:  Negative for chest pain and palpitations.  Gastrointestinal:  Negative for abdominal pain, diarrhea, nausea and vomiting.  Genitourinary:  Negative for difficulty urinating, dysuria and frequency.  Musculoskeletal:  Positive for back pain and neck pain. Negative for arthralgias, myalgias and neck stiffness.  Skin:  Negative for rash.  Neurological:  Positive for headaches. Negative for dizziness, syncope, facial asymmetry, speech difficulty, weakness, light-headedness and numbness.  Hematological:  Negative for adenopathy.  Psychiatric/Behavioral:  Negative for confusion.      Physical Exam Triage Vital Signs ED Triage Vitals  Enc Vitals Group     BP 12/18/20 1224 99/78     Pulse Rate 12/18/20 1224 91     Resp 12/18/20 1224 16     Temp 12/18/20 1224 99.6 F (  37.6 C)     Temp Source 12/18/20 1224 Oral     SpO2 12/18/20 1224 93 %     Weight --      Height --      Head Circumference --      Peak Flow --      Pain Score 12/18/20 1218 10     Pain Loc --      Pain Edu? --      Excl. in GC? --    No data found.  Updated Vital Signs BP 118/78 (BP Location: Left Arm)   Pulse 76   Temp 98.8 F (37.1 C) (Oral)   Resp 18   LMP 02/08/2015   SpO2 (!) 87%       Physical Exam Vitals and nursing note reviewed.  Constitutional:      General: She is not in acute distress.    Appearance: Normal appearance. She is not ill-appearing or toxic-appearing.  HENT:     Head: Normocephalic and atraumatic.     Right Ear: Tympanic membrane, ear canal and external ear normal.     Left Ear: Tympanic membrane, ear canal and external ear normal.     Nose: Congestion present.     Right Nostril: Epistaxis (dried blood in right nostril) present.     Mouth/Throat:     Mouth: Mucous membranes are moist.     Pharynx:  Oropharynx is clear.  Eyes:     General: No scleral icterus.       Right eye: No discharge.        Left eye: No discharge.     Extraocular Movements: Extraocular movements intact.     Conjunctiva/sclera: Conjunctivae normal.     Pupils: Pupils are equal, round, and reactive to light.  Cardiovascular:     Rate and Rhythm: Normal rate and regular rhythm.     Heart sounds: Normal heart sounds.  Pulmonary:     Effort: Pulmonary effort is normal. No respiratory distress.     Breath sounds: Wheezing present.  Musculoskeletal:     Cervical back: Normal range of motion and neck supple. Tenderness (TTP bilateral paracervical muscles) present.     Lumbar back: Tenderness (TTP throughout lumbar region, mostly over paralumbar muscles) present. Decreased range of motion.  Skin:    General: Skin is dry.  Neurological:     General: No focal deficit present.     Mental Status: She is alert and oriented to person, place, and time. Mental status is at baseline.     Cranial Nerves: No cranial nerve deficit.     Motor: No weakness.     Coordination: Coordination normal.     Gait: Gait normal.     Comments: Normal nose to finger testing and 5 out of 5 strength of bilateral upper and lower extremities.  Psychiatric:        Mood and Affect: Mood normal.        Behavior: Behavior normal.        Thought Content: Thought content normal.      UC Treatments / Results  Labs (all labs ordered are listed, but only abnormal results are displayed) Labs Reviewed - No data to display   EKG   Radiology No results found.  Procedures Procedures (including critical care time)  Medications Ordered in UC Medications  ipratropium-albuterol (DUONEB) 0.5-2.5 (3) MG/3ML nebulizer solution 3 mL (has no administration in time range)    Initial Impression / Assessment and Plan / UC Course  I have reviewed the triage vital signs and the nursing notes.  Pertinent labs & imaging results that were available  during my care of the patient were reviewed by me and considered in my medical decision making (see chart for details).   62 year old female with history of COPD and asthma presenting for 4-day history of fatigue, headaches, low back pain. Had a 6 minute nosebleed this morning. Headache has improved from yesterday. Denies red flags/symptoms.   Her vital signs are stable.  Her temperature is 98.8 degrees.  Her oxygen saturation is 87%.  I have reviewed previous office visit notes.  Patient was seen in the emergency department 04/26/2023 and oxygen was 85% at that time.  She was seen by pulmonology on 03/17/2023 and oxygen was 90% at that time.  She denies any significant shortness of breath at this time and is in no acute respiratory or other distress. She is speaking in full sentences.   Exam significant for slight nasal congestion, dried blood inside right nostril, tenderness of neck and low back.  Normal neurological exam.  Few scattered wheezes throughout all lung fields.  Patient given DuoNeb treatment in clinic.  O2 increased to 91%.  Cause of patient's headache may be due to chronic hypoxia.  This appears to be an ongoing issue.  She does have home oxygen.  Her oxygen did increase a little after nebulizer treatment.  She would like to go home and use her supplemental oxygen and follow-up with pulmonology.  Will discharge her with course of prednisone.  Also prescribing Norco as needed for severe back pain.  Explained to her that if her headache worsens or is not improving in the next couple days or if oxygen is getting worse instead of better, back pain worsens, she feels increasingly fatigued, weak or has any red flag signs and symptoms such as dizziness, vision changes, speech or balance problems, numbness/tingling, chest pain, racing heart then she should call EMS or go to ED immediately.  Explained to her that I cannot 100% rule out intracranial abnormality/hemorrhage but since her headache has  improved and her neurological exam is normal I have lower suspicion for this.  However, we did thoroughly discussed ED precautions.  I explained to her that she should also contact her pulmonologist for a follow-up appointment or PCP.   Final Clinical Impressions(s) / UC Diagnoses   Final diagnoses:  Acute nonintractable headache, unspecified headache type  Neck pain  Epistaxis  Acute bilateral low back pain without sciatica  Chronic obstructive pulmonary disease with hypoxia [facility])   Discharge Instructions   None     ED Prescriptions   None    PDMP not reviewed this encounter.     Shirlee Latch, PA-C 05/24/23 1616

## 2023-06-27 ENCOUNTER — Other Ambulatory Visit: Payer: Self-pay | Admitting: Specialist

## 2023-06-27 DIAGNOSIS — R911 Solitary pulmonary nodule: Secondary | ICD-10-CM

## 2023-06-29 ENCOUNTER — Ambulatory Visit
Admission: RE | Admit: 2023-06-29 | Discharge: 2023-06-29 | Disposition: A | Discharge status: Home / Self Care | Payer: BC Managed Care – PPO | Source: Ambulatory Visit | Attending: Specialist | Admitting: Specialist

## 2023-06-29 ENCOUNTER — Ambulatory Visit: Admission: RE | Admit: 2023-06-29 | Payer: BC Managed Care – PPO | Source: Ambulatory Visit

## 2023-06-29 DIAGNOSIS — R911 Solitary pulmonary nodule: Secondary | ICD-10-CM | POA: Diagnosis present

## 2023-07-07 ENCOUNTER — Encounter: Payer: Self-pay | Admitting: *Deleted

## 2023-07-11 ENCOUNTER — Ambulatory Visit: Payer: BC Managed Care – PPO | Admitting: Anesthesiology

## 2023-07-11 ENCOUNTER — Encounter: Payer: Self-pay | Admitting: *Deleted

## 2023-07-11 ENCOUNTER — Ambulatory Visit
Admission: RE | Admit: 2023-07-11 | Discharge: 2023-07-11 | Disposition: A | Discharge status: Home / Self Care | Payer: BC Managed Care – PPO | Source: Home / Self Care | Attending: Gastroenterology | Admitting: Gastroenterology

## 2023-07-11 ENCOUNTER — Encounter
Admission: RE | Disposition: A | Discharge status: Home / Self Care | Payer: Self-pay | Source: Home / Self Care | Attending: Gastroenterology

## 2023-07-11 DIAGNOSIS — K640 First degree hemorrhoids: Secondary | ICD-10-CM | POA: Insufficient documentation

## 2023-07-11 DIAGNOSIS — F17210 Nicotine dependence, cigarettes, uncomplicated: Secondary | ICD-10-CM | POA: Diagnosis not present

## 2023-07-11 DIAGNOSIS — D120 Benign neoplasm of cecum: Secondary | ICD-10-CM | POA: Diagnosis not present

## 2023-07-11 DIAGNOSIS — K648 Other hemorrhoids: Secondary | ICD-10-CM | POA: Diagnosis not present

## 2023-07-11 DIAGNOSIS — I10 Essential (primary) hypertension: Secondary | ICD-10-CM | POA: Diagnosis not present

## 2023-07-11 DIAGNOSIS — Z800 Family history of malignant neoplasm of digestive organs: Secondary | ICD-10-CM | POA: Diagnosis not present

## 2023-07-11 DIAGNOSIS — Z1211 Encounter for screening for malignant neoplasm of colon: Secondary | ICD-10-CM | POA: Diagnosis not present

## 2023-07-11 DIAGNOSIS — J4489 Other specified chronic obstructive pulmonary disease: Secondary | ICD-10-CM | POA: Diagnosis not present

## 2023-07-11 DIAGNOSIS — J9690 Respiratory failure, unspecified, unspecified whether with hypoxia or hypercapnia: Secondary | ICD-10-CM | POA: Diagnosis not present

## 2023-07-11 DIAGNOSIS — Z9981 Dependence on supplemental oxygen: Secondary | ICD-10-CM | POA: Insufficient documentation

## 2023-07-11 DIAGNOSIS — Z09 Encounter for follow-up examination after completed treatment for conditions other than malignant neoplasm: Secondary | ICD-10-CM | POA: Diagnosis not present

## 2023-07-11 DIAGNOSIS — Z86010 Personal history of colonic polyps: Secondary | ICD-10-CM | POA: Insufficient documentation

## 2023-07-11 DIAGNOSIS — K5730 Diverticulosis of large intestine without perforation or abscess without bleeding: Secondary | ICD-10-CM | POA: Diagnosis not present

## 2023-07-11 HISTORY — PX: POLYPECTOMY: SHX5525

## 2023-07-11 HISTORY — PX: COLONOSCOPY WITH PROPOFOL: SHX5780

## 2023-07-11 SURGERY — COLONOSCOPY WITH PROPOFOL
Anesthesia: General

## 2023-07-11 MED ORDER — PROPOFOL 500 MG/50ML IV EMUL
INTRAVENOUS | Status: DC | PRN
  Administered 2023-07-11: 150 ug/kg/min via INTRAVENOUS

## 2023-07-11 MED ORDER — PROPOFOL 10 MG/ML IV BOLUS
INTRAVENOUS | Status: DC | PRN
  Administered 2023-07-11: 60 mg via INTRAVENOUS

## 2023-07-11 MED ORDER — SODIUM CHLORIDE 0.9 % IV SOLN: INTRAVENOUS | Status: DC

## 2023-07-11 NOTE — Anesthesia Postprocedure Evaluation (Signed)
Anesthesia Post Note  Patient: Gloria Jones  Procedure(s) Performed: COLONOSCOPY WITH PROPOFOL  Patient location during evaluation: Endoscopy Anesthesia Type: General Level of consciousness: awake and alert Pain management: pain level controlled Vital Signs Assessment: post-procedure vital signs reviewed and stable Respiratory status: spontaneous breathing, nonlabored ventilation, respiratory function stable and patient connected to nasal cannula oxygen Cardiovascular status: blood pressure returned to baseline and stable Postop Assessment: no apparent nausea or vomiting Anesthetic complications: no   No notable events documented.   Last Vitals:  Vitals:   07/11/23 1409 07/11/23 1429  BP: 129/86 (!) 150/80  Pulse: 91   Resp: 16   Temp: 36.8 C   SpO2: 94%     Last Pain:  Vitals:   07/11/23 1429  TempSrc:   PainSc: 0-No pain                 Cleda Mccreedy Sander Remedios

## 2023-07-11 NOTE — Transfer of Care (Signed)
Immediate Anesthesia Transfer of Care Note  Patient: Gloria Jones  Procedure(s) Performed: COLONOSCOPY WITH PROPOFOL  Patient Location: Endoscopy Unit  Anesthesia Type:MAC  Level of Consciousness: drowsy and patient cooperative  Airway & Oxygen Therapy: Patient Spontanous Breathing and Patient connected to face mask oxygen  Post-op Assessment: Report given to RN, Post -op Vital signs reviewed and stable, and Patient moving all extremities X 4  Post vital signs: Reviewed and stable  Last Vitals:  Vitals Value Taken Time  BP 129/86 07/11/23 1410  Temp 36.8 C 07/11/23 1409  Pulse 88 07/11/23 1415  Resp 31 07/11/23 1415  SpO2 95 % 07/11/23 1415  Vitals shown include unfiled device data.  Last Pain:  Vitals:   07/11/23 1409  TempSrc: Temporal  PainSc: Asleep         Complications: No notable events documented.

## 2023-07-11 NOTE — Anesthesia Preprocedure Evaluation (Signed)
Anesthesia Evaluation  Patient identified by MRN, date of birth, ID band Patient awake    Reviewed: Allergy & Precautions, NPO status , Patient's Chart, lab work & pertinent test results  History of Anesthesia Complications Negative for: history of anesthetic complications  Airway Mallampati: III  TM Distance: <3 FB Neck ROM: full    Dental  (+) Upper Dentures, Lower Dentures   Pulmonary asthma , COPD, Current Smoker   Pulmonary exam normal        Cardiovascular Exercise Tolerance: Good hypertension, Normal cardiovascular exam     Neuro/Psych negative neurological ROS  negative psych ROS   GI/Hepatic negative GI ROS, Neg liver ROS,,,  Endo/Other  negative endocrine ROS    Renal/GU negative Renal ROS  negative genitourinary   Musculoskeletal   Abdominal   Peds  Hematology negative hematology ROS (+)   Anesthesia Other Findings Past Medical History: No date: Allergic state No date: Asthma No date: Bronchitis No date: COPD (chronic obstructive pulmonary disease) (HCC) No date: History of kidney stones No date: Hypertension No date: Pre-diabetes No date: Pulmonary nodules No date: Respiratory failure (HCC)     Comment:  chronic o2 2liters  Past Surgical History: 06/28/2017: COLONOSCOPY WITH PROPOFOL; N/A     Comment:  Procedure: COLONOSCOPY WITH PROPOFOL;  Surgeon:               Christena Deem, MD;  Location: [facility] ENDOSCOPY;                Service: Endoscopy;  Laterality: N/A; 03/25/2023: COLONOSCOPY WITH PROPOFOL; N/A     Comment:  Procedure: COLONOSCOPY WITH PROPOFOL;  Surgeon:               Regis Bill, MD;  Location: [facility] ENDOSCOPY;                Service: Endoscopy;  Laterality: N/A;  DM 07/30/2022: CYSTOSCOPY/URETEROSCOPY/HOLMIUM LASER/STENT PLACEMENT;  Right     Comment:  Procedure: CYSTOSCOPY/URETEROSCOPY/HOLMIUM LASER/STENT               PLACEMENT;  Surgeon: Sondra Come, MD;   Location:               [facility] ORS;  Service: Urology;  Laterality: Right; 06/28/2017: ESOPHAGOGASTRODUODENOSCOPY (EGD) WITH PROPOFOL; N/A     Comment:  Procedure: ESOPHAGOGASTRODUODENOSCOPY (EGD) WITH               PROPOFOL;  Surgeon: Christena Deem, MD;  Location:               [facility] ENDOSCOPY;  Service: Endoscopy;  Laterality: N/A; No date: KNEE SURGERY; Left     Comment:  meniscus repair 12/1977: OVARY SURGERY; Left     Comment:  partial ovary and fallopian tube removed  BMI    Body Mass Index: 34.28 kg/m      Reproductive/Obstetrics negative OB ROS                             Anesthesia Physical Anesthesia Plan  ASA: 3  Anesthesia Plan: General   Post-op Pain Management:    Induction: Intravenous  PONV Risk Score and Plan: Propofol infusion and TIVA  Airway Management Planned: Natural Airway and Nasal Cannula  Additional Equipment:   Intra-op Plan:   Post-operative Plan:   Informed Consent: I have reviewed the patients History and Physical, chart, labs and discussed the procedure including the risks, benefits and alternatives for the  proposed anesthesia with the patient or authorized representative who has indicated his/her understanding and acceptance.     Dental Advisory Given  Plan Discussed with: Anesthesiologist, CRNA and Surgeon  Anesthesia Plan Comments: (Patient consented for risks of anesthesia including but not limited to:  - adverse reactions to medications - risk of airway placement if required - damage to eyes, teeth, lips or other oral mucosa - nerve damage due to positioning  - sore throat or hoarseness - Damage to heart, brain, nerves, lungs, other parts of body or loss of life  Patient voiced understanding.)       Anesthesia Quick Evaluation

## 2023-07-11 NOTE — H&P (Signed)
Outpatient short stay form Pre-procedure 07/11/2023  Regis Bill, MD  Primary Physician: [facility]  Reason for visit:  History of polyps, poor prep on last colonoscopy  History of present illness:    62 y/o lady with severe COPD here for surveillance colonoscopy. Last colonoscopy 3 months ago but poor prep. No blood thinners. No significant abdominal surgeries. Had an uncle with colon cancer.     Current Facility-Administered Medications:    0.9 %  sodium chloride infusion, , Intravenous, Continuous, Reality Dejonge, Rossie Muskrat, MD, Last Rate: 20 mL/hr at 07/11/23 1312, New Bag at 07/11/23 1312  Medications Prior to Admission  Medication Sig Dispense Refill Last Dose   albuterol (PROVENTIL HFA;VENTOLIN HFA) 108 (90 BASE) MCG/ACT inhaler Inhale 1-2 puffs into the lungs every 4 (four) hours as needed for wheezing or shortness of breath. 1 Inhaler 0 07/10/2023   albuterol (PROVENTIL) (2.5 MG/3ML) 0.083% nebulizer solution Take 3 mLs (2.5 mg total) by nebulization every 4 (four) hours as needed for wheezing or shortness of breath. 75 mL 1 07/10/2023   aspirin EC 81 MG tablet Take 81 mg by mouth daily. Swallow whole.   Past Week   Cyanocobalamin (VITAMIN B12) 1000 MCG TBCR Take by mouth.   Past Week   DULoxetine (CYMBALTA) 30 MG capsule Take 30 mg by mouth 2 (two) times daily.   07/10/2023   Fluticasone-Umeclidin-Vilant (TRELEGY ELLIPTA) 100-62.5-25 MCG/ACT AEPB Take as directed - one dose every 24 hours for COPD. 180 each 0 07/10/2023   ipratropium-albuterol (DUONEB) 0.5-2.5 (3) MG/3ML SOLN Take 3 mLs by nebulization every 6 (six) hours as needed. 360 mL 2 Past Week   spironolactone (ALDACTONE) 25 MG tablet Take by mouth.   07/10/2023   benzonatate (TESSALON PERLES) 100 MG capsule Take 1 capsule (100 mg total) by mouth 3 (three) times daily as needed for cough. (Patient not taking: Reported on 07/11/2023) 30 capsule 0 Not Taking   ezetimibe (ZETIA) 10 MG tablet Take 1 tablet by mouth  daily.        Allergies  Allergen Reactions   Simvastatin     Other reaction(s): Muscle Pain   Statins     Other reaction(s): Other (See Comments) arthralgia   Fish Allergy    Lisinopril     Other reaction(s): Cough   Losartan     Other reaction(s): Headache   Pravastatin Other (See Comments)     Past Medical History:  Diagnosis Date   Allergic state    Asthma    Bronchitis    COPD (chronic obstructive pulmonary disease) (HCC)    History of kidney stones    Hypertension    Pre-diabetes    Pulmonary nodules    Respiratory failure (HCC)    chronic o2 2liters    Review of systems:  Otherwise negative.    Physical Exam  Gen: Alert, oriented. Appears stated age.  HEENT: PERRLA. Lungs: No respiratory distress CV: RRR Abd: soft, benign, no masses Ext: No edema    Planned procedures: Proceed with colonoscopy. The patient understands the nature of the planned procedure, indications, risks, alternatives and potential complications including but not limited to bleeding, infection, perforation, damage to internal organs and possible oversedation/side effects from anesthesia. The patient agrees and gives consent to proceed.  Please refer to procedure notes for findings, recommendations and patient disposition/instructions.     Regis Bill, MD [facility]

## 2023-07-11 NOTE — Op Note (Signed)
[facility] Patient Name: Gloria Jones Procedure Date: 07/11/2023 1:30 PM MRN: [id number] Account #: [id number] Date of Birth: November 14, 1961 Admit Type: Outpatient Age: 62 Room: [facility] ENDO ROOM 3 Gender: Female Note Status: Finalized Instrument Name: Prentice Docker [id number] Procedure:             Colonoscopy Indications:           Surveillance: Personal history of adenomatous polyps,                         inadequate prep on last colonoscopy (less than 1 year                         ago) Providers:             Eather Colas MD, MD Referring MD:          [facility] Medicines:             Monitored Anesthesia Care Complications:         No immediate complications. Estimated blood loss:                         Minimal. Procedure:             Pre-Anesthesia Assessment:                        - Prior to the procedure, a History and Physical was                         performed, and patient medications and allergies were                         reviewed. The patient is competent. The risks and                         benefits of the procedure and the sedation options and                         risks were discussed with the patient. All questions                         were answered and informed consent was obtained.                         Patient identification and proposed procedure were                         verified by the physician, the nurse, the                         anesthesiologist, the anesthetist and the technician                         in the endoscopy suite. Mental Status Examination:                         alert and oriented. Airway Examination: normal  oropharyngeal airway and neck mobility. Respiratory                         Examination: clear to auscultation. CV Examination:                         normal. Prophylactic Antibiotics: The patient does not                         require prophylactic  antibiotics. Prior                         Anticoagulants: The patient has taken no anticoagulant                         or antiplatelet agents. ASA Grade Assessment: III - A                         patient with severe systemic disease. After reviewing                         the risks and benefits, the patient was deemed in                         satisfactory condition to undergo the procedure. The                         anesthesia plan was to use monitored anesthesia care                         (MAC). Immediately prior to administration of                         medications, the patient was re-assessed for adequacy                         to receive sedatives. The heart rate, respiratory                         rate, oxygen saturations, blood pressure, adequacy of                         pulmonary ventilation, and response to care were                         monitored throughout the procedure. The physical                         status of the patient was re-assessed after the                         procedure.                        After obtaining informed consent, the colonoscope was                         passed under direct vision. Throughout the procedure,  the patient's blood pressure, pulse, and oxygen                         saturations were monitored continuously. The                         Colonoscope was introduced through the anus and                         advanced to the the cecum, identified by appendiceal                         orifice and ileocecal valve. The colonoscopy was                         performed without difficulty. The patient tolerated                         the procedure well. The quality of the bowel                         preparation was inadequate. The ileocecal valve,                         appendiceal orifice, and rectum were photographed. Findings:      The perianal and digital rectal examinations were normal.       A 7 mm polyp was found in the cecum. The polyp was sessile. The polyp       was removed with a cold snare. Resection and retrieval were complete.       Estimated blood loss was minimal. To prevent bleeding post-intervention,       one hemostatic clip was successfully placed. There was no bleeding       during, or at the end, of the procedure.      A 2 mm polyp was found in the cecum. The polyp was sessile. The polyp       was removed with a cold snare. Resection and retrieval were complete.       Estimated blood loss was minimal.      Multiple large-mouthed and small-mouthed diverticula were found in the       sigmoid colon, descending colon and cecum.      Internal hemorrhoids were found during retroflexion. The hemorrhoids       were Grade I (internal hemorrhoids that do not prolapse).      The exam was otherwise without abnormality on direct and retroflexion       views. Impression:            - Preparation of the colon was inadequate.                        - One 7 mm polyp in the cecum, removed with a cold                         snare. Resected and retrieved. Clip was placed.                        - One 2 mm polyp in the cecum, removed with a cold  snare. Resected and retrieved.                        - Diverticulosis in the sigmoid colon, in the                         descending colon and in the cecum.                        - Internal hemorrhoids.                        - The examination was otherwise normal on direct and                         retroflexion views. Recommendation:        - Discharge patient to home.                        - Resume previous diet.                        - Continue present medications.                        - Await pathology results.                        - Repeat colonoscopy in 6-12 months because the bowel                         preparation was suboptimal.                        - Return to referring physician as  previously                         scheduled. Procedure Code(s):     --- Professional ---                        [phone], Colonoscopy, flexible; with removal of                         tumor(s), polyp(s), or other lesion(s) by snare                         technique Diagnosis Code(s):     --- Professional ---                        Z86.010, Personal history of colonic polyps                        K64.0, First degree hemorrhoids                        D12.0, Benign neoplasm of cecum                        K57.30, Diverticulosis of large intestine without                         perforation or  abscess without bleeding CPT copyright 2022 [employer]. All rights reserved. The codes documented in this report are preliminary and upon coder review may  be revised to meet current compliance requirements. Eather Colas MD, MD 07/11/2023 2:09:14 PM Number of Addenda: 0 Note Initiated On: 07/11/2023 1:30 PM Scope Withdrawal Time: 0 hours 7 minutes 31 seconds  Total Procedure Duration: 0 hours 13 minutes 31 seconds  Estimated Blood Loss:  Estimated blood loss was minimal.      [facility]

## 2023-07-11 NOTE — Interval H&P Note (Signed)
History and Physical Interval Note:  07/11/2023 1:39 PM  Marcille Barman  has presented today for surgery, with the diagnosis of PERSONAL HISTORY OF COLON POLYPS.  The various methods of treatment have been discussed with the patient and family. After consideration of risks, benefits and other options for treatment, the patient has consented to  Procedure(s): COLONOSCOPY WITH PROPOFOL (N/A) as a surgical intervention.  The patient's history has been reviewed, patient examined, no change in status, stable for surgery.  I have reviewed the patient's chart and labs.  Questions were answered to the patient's satisfaction.     Regis Bill  Ok to proceed with colonoscopy

## 2023-07-12 ENCOUNTER — Encounter: Payer: Self-pay | Admitting: Gastroenterology

## 2023-08-09 ENCOUNTER — Ambulatory Visit: Payer: BC Managed Care – PPO | Admitting: Urology

## 2023-08-23 ENCOUNTER — Other Ambulatory Visit: Payer: Self-pay | Admitting: *Deleted

## 2023-08-23 ENCOUNTER — Ambulatory Visit: Payer: BC Managed Care – PPO | Admitting: Urology

## 2023-08-23 DIAGNOSIS — N200 Calculus of kidney: Secondary | ICD-10-CM

## 2023-09-15 ENCOUNTER — Ambulatory Visit: Payer: BC Managed Care – PPO

## 2023-09-15 ENCOUNTER — Ambulatory Visit
Admission: EM | Admit: 2023-09-15 | Discharge: 2023-09-15 | Disposition: A | Discharge status: Home / Self Care | Payer: BC Managed Care – PPO

## 2023-09-15 DIAGNOSIS — W19XXXA Unspecified fall, initial encounter: Secondary | ICD-10-CM | POA: Diagnosis not present

## 2023-09-15 DIAGNOSIS — F17200 Nicotine dependence, unspecified, uncomplicated: Secondary | ICD-10-CM

## 2023-09-15 DIAGNOSIS — M7918 Myalgia, other site: Secondary | ICD-10-CM | POA: Diagnosis not present

## 2023-09-15 DIAGNOSIS — F1290 Cannabis use, unspecified, uncomplicated: Secondary | ICD-10-CM | POA: Diagnosis not present

## 2023-09-15 DIAGNOSIS — M5030 Other cervical disc degeneration, unspecified cervical region: Secondary | ICD-10-CM

## 2023-09-15 MED ORDER — ACETAMINOPHEN 325 MG PO TABS
975.0000 mg | ORAL_TABLET | Freq: Once | ORAL | Status: AC
  Administered 2023-09-15: 975 mg via ORAL

## 2023-09-15 NOTE — ED Provider Notes (Signed)
[facility]    CSN: [id number] Arrival date & time: 09/15/23  1321      History   Chief Complaint Chief Complaint  Patient presents with   Fall    HPI Gloria Jones is a 62 y.o. female.   62 year old female, Gloria Jones, Gloria Jones to urgent care for evaluation of fall that occurred on 09/12/23.  Patient states she tripped over her sister's walker and landed awkwardly onto floor.  Patient denies loss of consciousness or head strike.  Patient states she is not taking any medicine for this, does smoke cigarettes" trying to quit" and marijuana, no alcohol use.  The history is provided by the patient. No language interpreter was used.    Past Medical History:  Diagnosis Date   Allergic state    Asthma    Bronchitis    COPD (chronic obstructive pulmonary disease) (HCC)    History of kidney stones    Hypertension    Pre-diabetes    Pulmonary nodules    Respiratory failure (HCC)    chronic o2 2liters    Patient Active Problem List   Diagnosis Date Noted   Musculoskeletal pain 09/15/2023   Smoker 09/15/2023   Fall 09/15/2023   Marijuana use 09/15/2023   DDD (degenerative disc disease), cervical 09/15/2023   Influenza A with pneumonia 12/16/2022   Acute respiratory failure with hypoxia (HCC) 12/16/2022   COPD with acute exacerbation (HCC) 12/22/2018   Prediabetes 05/06/2015   Essential (primary) hypertension 09/06/2014    Past Surgical History:  Procedure Laterality Date   COLONOSCOPY WITH PROPOFOL N/A 06/28/2017   Procedure: COLONOSCOPY WITH PROPOFOL;  Surgeon: Christena Deem, MD;  Location: [facility] ENDOSCOPY;  Service: Endoscopy;  Laterality: N/A;   COLONOSCOPY WITH PROPOFOL N/A 03/25/2023   Procedure: COLONOSCOPY WITH PROPOFOL;  Surgeon: Regis Bill, MD;  Location: [facility] ENDOSCOPY;  Service: Endoscopy;  Laterality: N/A;  DM   COLONOSCOPY WITH PROPOFOL N/A 07/11/2023   Procedure: COLONOSCOPY WITH PROPOFOL;  Surgeon: Regis Bill, MD;   Location: [facility] ENDOSCOPY;  Service: Endoscopy;  Laterality: N/A;   CYSTOSCOPY/URETEROSCOPY/HOLMIUM LASER/STENT PLACEMENT Right 07/30/2022   Procedure: CYSTOSCOPY/URETEROSCOPY/HOLMIUM LASER/STENT PLACEMENT;  Surgeon: Sondra Come, MD;  Location: [facility] ORS;  Service: Urology;  Laterality: Right;   ESOPHAGOGASTRODUODENOSCOPY (EGD) WITH PROPOFOL N/A 06/28/2017   Procedure: ESOPHAGOGASTRODUODENOSCOPY (EGD) WITH PROPOFOL;  Surgeon: Christena Deem, MD;  Location: [facility] ENDOSCOPY;  Service: Endoscopy;  Laterality: N/A;   KNEE SURGERY Left    meniscus repair   OVARY SURGERY Left 12/1977   partial ovary and fallopian tube removed   POLYPECTOMY  07/11/2023   Procedure: POLYPECTOMY;  Surgeon: Regis Bill, MD;  Location: [facility] ENDOSCOPY;  Service: Endoscopy;;    OB History   No obstetric history on file.      Home Medications    Prior to Admission medications   Medication Sig Start Date End Date Taking? Authorizing Provider  aspirin EC 81 MG tablet Take 81 mg by mouth daily. Swallow whole.   Yes [provider]  DULoxetine (CYMBALTA) 30 MG capsule Take 30 mg by mouth 2 (two) times daily.   Yes [provider]  Fluticasone-Umeclidin-Vilant (TRELEGY ELLIPTA) 100-62.5-25 MCG/ACT AEPB Take as directed - one dose every 24 hours for COPD. 04/26/23  Yes Shaune Pollack, MD  spironolactone (ALDACTONE) 25 MG tablet Take by mouth. 12/27/18  Yes [provider]  traZODone (DESYREL) 50 MG tablet Take by mouth. 08/16/22  Yes [provider]  albuterol (PROVENTIL HFA;VENTOLIN HFA)  108 (90 BASE) MCG/ACT inhaler Inhale 1-2 puffs into the lungs every 4 (four) hours as needed for wheezing or shortness of breath. 05/24/15   Betancourt, Jarold Song, NP  albuterol (PROVENTIL) (2.5 MG/3ML) 0.083% nebulizer solution Take 3 mLs (2.5 mg total) by nebulization every 4 (four) hours as needed for wheezing or shortness of breath. 04/26/23 04/25/24  Shaune Pollack, MD  benzonatate (TESSALON  PERLES) 100 MG capsule Take 1 capsule (100 mg total) by mouth 3 (three) times daily as needed for cough. Patient not taking: Reported on 07/11/2023 04/26/23 04/25/24  Shaune Pollack, MD  Cyanocobalamin (VITAMIN B12) 1000 MCG TBCR Take by mouth.    [provider]  ezetimibe (ZETIA) 10 MG tablet Take 1 tablet by mouth daily. 07/06/22 07/06/23  [provider]  ipratropium-albuterol (DUONEB) 0.5-2.5 (3) MG/3ML SOLN Take 3 mLs by nebulization every 6 (six) hours as needed. 12/19/22   [place], Eric J, DO    Family History Family History  Problem Relation Age of Onset   Hypertension Mother    Colon cancer Maternal Uncle    Breast cancer Maternal Aunt        mat great aunt    Social History Social History   Tobacco Use   Smoking status: Every Day    Current packs/day: 1.00    Types: Cigarettes    Passive exposure: Current   Smokeless tobacco: Never  Vaping Use   Vaping status: Never Used  Substance Use Topics   Alcohol use: Not Currently    Comment: occasionally,non last 24hrs   Drug use: Yes    Types: Marijuana    Comment: daily     Allergies   Simvastatin, Statins, Fish allergy, Lisinopril, Losartan, and Pravastatin   Review of Systems Review of Systems  Musculoskeletal:  Positive for arthralgias and myalgias.  Skin:  Negative for color change and wound.  All other systems reviewed and are negative.    Physical Exam Triage Vital Signs ED Triage Vitals [09/15/23 1340]  Encounter Vitals Group     BP      Systolic BP Percentile      Diastolic BP Percentile      Pulse      Resp      Temp      Temp src      SpO2      Weight 190 lb (86.2 kg)     Height      Head Circumference      Peak Flow      Pain Score 10     Pain Loc      Pain Education      Exclude from Growth Chart    No data found.  Updated Vital Signs BP (!) 155/103 (BP Location: Right Arm)   Pulse 88   Temp 98.4 F (36.9 C) (Oral)   Resp 20   Wt 190 lb (86.2 kg)   LMP  02/08/2015   SpO2 91%   BMI 33.13 kg/m   Visual Acuity Right Eye Distance:   Left Eye Distance:   Bilateral Distance:    Right Eye Near:   Left Eye Near:    Bilateral Near:     Physical Exam Vitals and nursing note reviewed.  Neck:     Trachea: Trachea normal.  Cardiovascular:     Rate and Rhythm: Normal rate.     Pulses: Normal pulses.     Heart sounds: Normal heart sounds.  Pulmonary:     Effort: Pulmonary effort is normal.  Breath sounds: Examination of the right-lower field reveals decreased breath sounds. Examination of the left-lower field reveals decreased breath sounds. Decreased breath sounds present.     Comments: Uses O2  regularly via [place] Musculoskeletal:     Left upper arm: Tenderness and bony tenderness present.     Left elbow: No deformity. Tenderness present in olecranon process.       Arms:     Cervical back: Normal range of motion. No deformity or bony tenderness. Pain with movement and muscular tenderness present. No spinous process tenderness.     Comments: Area of tenderness marked, no step offs  Neurological:     General: No focal deficit present.     Mental Status: She is alert and oriented to person, place, and time.     GCS: GCS eye subscore is 4. GCS verbal subscore is 5. GCS motor subscore is 6.     Cranial Nerves: No cranial nerve deficit.     Sensory: No sensory deficit.  Psychiatric:        Attention and Perception: Attention normal.        Mood and Affect: Mood normal.        Speech: Speech normal.        Behavior: Behavior normal.      UC Treatments / Results  Labs (all labs ordered are listed, but only abnormal results are displayed) Labs Reviewed - No data to display  EKG   Radiology DG Elbow Complete Left  Result Date: 09/15/2023 CLINICAL DATA:  Fall, elbow injury EXAM: LEFT ELBOW - COMPLETE 3+ VIEW COMPARISON:  None Available. FINDINGS: Mild spurring of the lateral and medial epicondyles of the humerus. No elbow joint  effusion. Supinator fat pad unremarkable. Mild spurring of the coronoid process of the ulna. No visible fracture. IMPRESSION: 1. No acute fracture or dislocation. 2. Mild degenerative spurring of the lateral and medial epicondyles of the humerus. Electronically Signed   By: Gaylyn Rong M.D.   On: 09/15/2023 16:06   DG Humerus Left  Result Date: 09/15/2023 CLINICAL DATA:  Fall, neck pain, arm pain EXAM: LEFT HUMERUS - 2+ VIEW COMPARISON:  Shoulder radiographs 06/02/2020 FINDINGS: Degenerative AC joint spurring. No humeral fracture identified. No fracture or acute bony findings. There is a calcified nodule in the left pectoralis major shown on prior chest CT corresponding to the density projecting over the left upper chest. IMPRESSION: 1. No humeral fracture identified. 2. Degenerative AC joint spurring. Electronically Signed   By: Gaylyn Rong M.D.   On: 09/15/2023 16:04   DG Cervical Spine Complete  Result Date: 09/15/2023 CLINICAL DATA:  Fall, neck pain EXAM: CERVICAL SPINE - COMPLETE 4+ VIEW COMPARISON:  06/02/2020 FINDINGS: On the lateral projection, C7 and the cervicothoracic junction are obscured on both the standard lateral and swimmer's views. This reduces diagnostic sensitivity and specificity. Loss of intervertebral disc height and intervertebral spurring at C5-6. No prevertebral soft tissue swelling. Uncinate spurring causing foraminal narrowing on the left at C5-6 and C6-7. No fracture or static instability identified. IMPRESSION: 1. No fracture or static instability identified. Please note that C7 and the cervicothoracic junction are not visible on the lateral projection despite the swimmer's view attempt. This lowers diagnostic sensitivity and specificity. Note: If the patient meets NEXUS or CCR clinical criteria for cervical spine injury then CT of the cervical spine is recommended. 2. Cervical spondylosis and degenerative disc disease causing left foraminal narrowing at C5-6 and  C6-7. Electronically Signed   By: Zollie Beckers  Ova Freshwater M.D.   On: 09/15/2023 16:02    Procedures Procedures (including critical care time)  Medications Ordered in UC Medications  acetaminophen (TYLENOL) tablet 975 mg (975 mg Oral Given 09/15/23 1426)    Initial Impression / Assessment and Plan / UC Course  I have reviewed the triage vital signs and the nursing notes.  Pertinent labs & imaging results that were available during my care of the patient were reviewed by me and considered in my medical decision making (see chart for details).  Clinical Course as of 09/15/23 1733  Thu Sep 15, 2023  1426 Xrays pending, pt received tylenol for pain [JD]    Clinical Course User Index [JD] Lelon Ikard, Para March, NP  Discussed exam findings and plan of care with patient, strict go to ER precautions given.   Patient verbalized understanding to this provider.  Ddx: Fall, musculoskeletal pain, DDD, cervical, smoker Final Clinical Impressions(s) / UC Diagnoses   Final diagnoses:  Fall, initial encounter  Musculoskeletal pain  Smoker  Marijuana use  DDD (degenerative disc disease), cervical     Discharge Instructions      Your xrays were negative for acute findings. You have degenerative changes of cervical spine(neck). You will be sore for several days after falling. Stop smoking cigarettes and marijuana. May take tylenol as label directed for pain, may use heat or ice to affected area and or lidocaine patch/biofreeze. Follow up with PCP.      ED Prescriptions   None    PDMP not reviewed this encounter.   Clancy Gourd, NP 09/15/23 1734

## 2023-09-15 NOTE — Discharge Instructions (Addendum)
Your xrays were negative for acute findings. You have degenerative changes of cervical spine(neck). You will be sore for several days after falling. Stop smoking cigarettes and marijuana. May take tylenol as label directed for pain, may use heat or ice to affected area and or lidocaine patch/biofreeze. Follow up with PCP.

## 2023-09-15 NOTE — ED Triage Notes (Signed)
fell left arm injury /neck/flank/back pain left sided, knot rt wrist 1 day ago

## 2023-09-27 ENCOUNTER — Emergency Department: Payer: BC Managed Care – PPO

## 2023-09-27 ENCOUNTER — Other Ambulatory Visit: Payer: Self-pay

## 2023-09-27 ENCOUNTER — Emergency Department
Admission: EM | Admit: 2023-09-27 | Discharge: 2023-09-27 | Disposition: A | Discharge status: Home / Self Care | Payer: BC Managed Care – PPO | Attending: Emergency Medicine | Admitting: Emergency Medicine

## 2023-09-27 DIAGNOSIS — J449 Chronic obstructive pulmonary disease, unspecified: Secondary | ICD-10-CM | POA: Insufficient documentation

## 2023-09-27 DIAGNOSIS — M5441 Lumbago with sciatica, right side: Secondary | ICD-10-CM | POA: Diagnosis not present

## 2023-09-27 DIAGNOSIS — M545 Low back pain, unspecified: Secondary | ICD-10-CM | POA: Diagnosis present

## 2023-09-27 DIAGNOSIS — M5440 Lumbago with sciatica, unspecified side: Secondary | ICD-10-CM

## 2023-09-27 DIAGNOSIS — I10 Essential (primary) hypertension: Secondary | ICD-10-CM | POA: Insufficient documentation

## 2023-09-27 MED ORDER — OXYCODONE-ACETAMINOPHEN 5-325 MG PO TABS: 1.0000 | ORAL_TABLET | ORAL | 0 refills | Status: DC | PRN

## 2023-09-27 MED ORDER — BACLOFEN 10 MG PO TABS: 10.0000 mg | ORAL_TABLET | Freq: Three times a day (TID) | ORAL | 0 refills | Status: AC

## 2023-09-27 MED ORDER — OXYCODONE-ACETAMINOPHEN 5-325 MG PO TABS
1.0000 | ORAL_TABLET | Freq: Once | ORAL | Status: AC
  Administered 2023-09-27: 1 via ORAL
  Filled 2023-09-27: qty 1

## 2023-09-27 MED ORDER — CYCLOBENZAPRINE HCL 10 MG PO TABS
5.0000 mg | ORAL_TABLET | Freq: Once | ORAL | Status: AC
  Administered 2023-09-27: 5 mg via ORAL
  Filled 2023-09-27: qty 1

## 2023-09-27 MED ORDER — KETOROLAC TROMETHAMINE 15 MG/ML IJ SOLN
15.0000 mg | Freq: Once | INTRAMUSCULAR | Status: AC
  Administered 2023-09-27: 15 mg via INTRAMUSCULAR
  Filled 2023-09-27: qty 1

## 2023-09-27 MED ORDER — PREDNISONE 10 MG (21) PO TBPK: ORAL_TABLET | ORAL | 0 refills | Status: DC

## 2023-09-27 NOTE — ED Triage Notes (Signed)
Pt presents to ED with c/o of back pain. Pt states falling out of rolling walker in Sept. Pt denies any new incontinence.

## 2023-09-27 NOTE — ED Provider Notes (Signed)
[facility] Provider Note    Event Date/Time   First MD Initiated Contact with Patient 09/27/23 1918     (approximate)   History   No chief complaint on file.   HPI  Gloria Jones is a 62 y.o. female with history of hypertension, asthma, COPD, kidney stones presents emergency department with low back pain.  Patient states been ongoing for several days.  States had a fall back in September.  No numbness or tingling but pain does radiate down the right leg.  No fever or chills.  Patient used multiple over-the-counter medications and creams without any relief      Physical Exam   Triage Vital Signs: ED Triage Vitals  Encounter Vitals Group     BP 09/27/23 1710 (!) 163/100     Systolic BP Percentile --      Diastolic BP Percentile --      Pulse Rate 09/27/23 1710 96     Resp 09/27/23 1710 20     Temp 09/27/23 1710 98.4 F (36.9 C)     Temp Source 09/27/23 1710 Oral     SpO2 09/27/23 1710 96 %     Weight 09/27/23 1711 189 lb 9.5 oz (86 kg)     Height --      Head Circumference --      Peak Flow --      Pain Score 09/27/23 1711 10     Pain Loc --      Pain Education --      Exclude from Growth Chart --     Most recent vital signs: Vitals:   09/27/23 1710  BP: (!) 163/100  Pulse: 96  Resp: 20  Temp: 98.4 F (36.9 C)  SpO2: 96%     General: Awake, no distress.   CV:  Good peripheral perfusion. regular rate and  rhythm Resp:  Normal effort. Abd:  No distention.   Other:  Lumbar spine tender to palpation, spasms noted at the paravertebral muscles, 5 or 5 strength lower extremity   ED Results / Procedures / Treatments   Labs (all labs ordered are listed, but only abnormal results are displayed) Labs Reviewed - No data to display   EKG     RADIOLOGY X-ray of the lumbar spine    PROCEDURES:   Procedures   MEDICATIONS ORDERED IN ED: Medications  ketorolac (TORADOL) 15 MG/ML injection 15 mg (15 mg Intramuscular  Given 09/27/23 2001)  oxyCODONE-acetaminophen (PERCOCET/ROXICET) 5-325 MG per tablet 1 tablet (1 tablet Oral Given 09/27/23 2001)  cyclobenzaprine (FLEXERIL) tablet 5 mg (5 mg Oral Given 09/27/23 2026)     IMPRESSION / MDM / ASSESSMENT AND PLAN / ED COURSE  I reviewed the triage vital signs and the nursing notes.                              Differential diagnosis includes, but is not limited to, muscle spasm, lumbar radiculopathy, fracture  Patient's presentation is most consistent with acute complicated illness / injury requiring diagnostic workup.   X-ray lumbar spine independently reviewed interpreted by me as being negative for any acute abnormality  Patient was given Toradol 15 mg IM, Flexeril 5 mg p.o. and Percocet 5 mg / 325 mg p.o.  She is to follow-up with her regular doctor if not improving in 5 to 7 days.  Return emergency department worsening.  She is given a prescription for baclofen, oxycodone and  prednisone.  Return emergency department if worsening.  She is in agreement treatment plan.  Discharged stable condition.      FINAL CLINICAL IMPRESSION(S) / ED DIAGNOSES   Final diagnoses:  Acute right-sided low back pain with sciatica, sciatica laterality unspecified     Rx / DC Orders   ED Discharge Orders          Ordered    predniSONE (STERAPRED UNI-PAK 21 TAB) 10 MG (21) TBPK tablet        09/27/23 2022    baclofen (LIORESAL) 10 MG tablet  3 times daily        09/27/23 2022    oxyCODONE-acetaminophen (PERCOCET) 5-325 MG tablet  Every 4 hours PRN        09/27/23 2022             Note:  This document was prepared using Dragon voice recognition software and may include unintentional dictation errors.    Faythe Ghee, PA-C 09/27/23 2305    Merwyn Katos, MD 09/27/23 [phone]

## 2023-10-28 ENCOUNTER — Other Ambulatory Visit: Payer: Self-pay | Admitting: Family Medicine

## 2023-10-28 DIAGNOSIS — Z1231 Encounter for screening mammogram for malignant neoplasm of breast: Secondary | ICD-10-CM

## 2023-11-09 ENCOUNTER — Encounter: Payer: Self-pay | Admitting: Emergency Medicine

## 2023-11-09 ENCOUNTER — Other Ambulatory Visit: Payer: Self-pay

## 2023-11-09 ENCOUNTER — Emergency Department
Admission: EM | Admit: 2023-11-09 | Discharge: 2023-11-09 | Disposition: A | Discharge status: Home / Self Care | Payer: BC Managed Care – PPO | Attending: Emergency Medicine | Admitting: Emergency Medicine

## 2023-11-09 DIAGNOSIS — J449 Chronic obstructive pulmonary disease, unspecified: Secondary | ICD-10-CM | POA: Insufficient documentation

## 2023-11-09 DIAGNOSIS — R040 Epistaxis: Secondary | ICD-10-CM | POA: Diagnosis present

## 2023-11-09 LAB — CBC WITH DIFFERENTIAL/PLATELET
Abs Immature Granulocytes: 0.03 10*3/uL (ref 0.00–0.07)
Basophils Absolute: 0 10*3/uL (ref 0.0–0.1)
Basophils Relative: 0 %
Eosinophils Absolute: 0.1 10*3/uL (ref 0.0–0.5)
Eosinophils Relative: 1 %
HCT: 54.8 % — ABNORMAL HIGH (ref 36.0–46.0)
Hemoglobin: 17.4 g/dL — ABNORMAL HIGH (ref 12.0–15.0)
Immature Granulocytes: 0 %
Lymphocytes Relative: 23 %
Lymphs Abs: 2.5 10*3/uL (ref 0.7–4.0)
MCH: 31.5 pg (ref 26.0–34.0)
MCHC: 31.8 g/dL (ref 30.0–36.0)
MCV: 99.3 fL (ref 80.0–100.0)
Monocytes Absolute: 1.6 10*3/uL — ABNORMAL HIGH (ref 0.1–1.0)
Monocytes Relative: 15 %
Neutro Abs: 6.6 10*3/uL (ref 1.7–7.7)
Neutrophils Relative %: 61 %
Platelets: 215 10*3/uL (ref 150–400)
RBC: 5.52 MIL/uL — ABNORMAL HIGH (ref 3.87–5.11)
RDW: 13.3 % (ref 11.5–15.5)
WBC: 10.8 10*3/uL — ABNORMAL HIGH (ref 4.0–10.5)
nRBC: 0 % (ref 0.0–0.2)

## 2023-11-09 LAB — BASIC METABOLIC PANEL
Anion gap: 5 (ref 5–15)
BUN: 14 mg/dL (ref 8–23)
CO2: 34 mmol/L — ABNORMAL HIGH (ref 22–32)
Calcium: 8.5 mg/dL — ABNORMAL LOW (ref 8.9–10.3)
Chloride: 97 mmol/L — ABNORMAL LOW (ref 98–111)
Creatinine, Ser: 0.54 mg/dL (ref 0.44–1.00)
GFR, Estimated: >60 mL/min (ref >60)
Glucose, Bld: 73 mg/dL (ref 70–99)
Potassium: 4.4 mmol/L (ref 3.5–5.1)
Sodium: 136 mmol/L (ref 135–145)

## 2023-11-09 MED ORDER — OXYMETAZOLINE HCL 0.05 % NA SOLN
1.0000 | Freq: Two times a day (BID) | NASAL | Status: DC | PRN
  Filled 2023-11-09: qty 30

## 2023-11-09 NOTE — Discharge Instructions (Signed)
If you have a nosebleed, blow your nose to get rid of blood clots and give yourself 2 sprays of the Afrin no spray and then apply nasal clamps.  When you are clamping her nose for 30 minutes, administer oxygen through your mouth as you are breathing through her mouth.  If you have nosebleed that cannot stop, come to the emergency department for treatment.  I think the dry air is contributing to your recurrent nosebleeds.  Ask your doctor about humidified oxygen through your oxygen cannula.  In the meantime you can use nasal saline sprays and a humidifier in your home to prevent dryness.   Thank you for choosing [place] for your health care today!  Please see your primary doctor this week for a follow up appointment.   If you have any new, worsening, or unexpected symptoms call your doctor right away or come back to the emergency department for reevaluation.  It was my pleasure to care for you today.   Daneil Dan Modesto Charon, MD

## 2023-11-09 NOTE — ED Triage Notes (Signed)
Pt to ED via POV, pt wears chronic oxygen. Pt states that she has had nosebleeds every day for the last 2 weeks, Pt states that she has also had headaches. Pt reports that she does not normally have issues wit nose bleeds. Pt's SpO2 85-87% on her chronic 2 liters, pt placed on hospital tank with sat's improving to 91%, pt states that this is her normal SpO2 reading. Pt has hx/o COPD.

## 2023-11-09 NOTE — ED Notes (Signed)
Pt reports she has O2 chronically and has O2 in the car with her. Pt alert and oriented and verbalized understanding of d/c instructions.

## 2023-11-09 NOTE — ED Provider Notes (Signed)
[facility] Provider Note    Event Date/Time   First MD Initiated Contact with Patient 11/09/23 1145     (approximate)   History   Epistaxis   HPI  Gloria Jones is a 62 y.o. female   Past medical history of COPD on chronic home O2, no blood thinners, who presents to Emergency Department with daily nosebleeds for the last couple of weeks.  Resolved spontaneously.  No trauma.  No fever no chills no pain.  She has no chest pain or shortness of breath no cough.  No other acute medical complaints.  She lives in a trailer and has recently turned on the heat due to cold weather.  Independent Historian contributed to assessment above: Her husband is at bedside to corroborate information past medical history as above       Physical Exam   Triage Vital Signs: ED Triage Vitals  Encounter Vitals Group     BP 11/09/23 0934 119/78     Systolic BP Percentile --      Diastolic BP Percentile --      Pulse Rate 11/09/23 0934 90     Resp 11/09/23 0934 16     Temp 11/09/23 0934 98.7 F (37.1 C)     Temp Source 11/09/23 0934 Oral     SpO2 11/09/23 0933 92 %     Weight 11/09/23 0934 194 lb (88 kg)     Height 11/09/23 0934 5\' 3"  (1.6 m)     Head Circumference --      Peak Flow --      Pain Score 11/09/23 0934 10     Pain Loc --      Pain Education --      Exclude from Growth Chart --     Most recent vital signs: Vitals:   11/09/23 0933 11/09/23 0934  BP:  119/78  Pulse:  90  Resp:  16  Temp:  98.7 F (37.1 C)  SpO2: 92% 93%    General: Awake, no distress.  CV:  Good peripheral perfusion.  Resp:  Normal effort.  Abd:  No distention.  Other:  No septal hematoma.  Dried blood in the nares on the right nostril.  Left nostril appears normal.  Skin appears warm well-perfused vital signs within normal limits.  No hypoxemia on her home O2.   ED Results / Procedures / Treatments   Labs (all labs ordered are listed, but only abnormal results are  displayed) Labs Reviewed  CBC WITH DIFFERENTIAL/PLATELET  BASIC METABOLIC PANEL     I ordered and reviewed the above labs they are notable for H&H within normal limits   PROCEDURES:  Critical Care performed: No  Procedures   MEDICATIONS ORDERED IN ED: Medications  oxymetazoline (AFRIN) 0.05 % nasal spray 1 spray (has no administration in time range)     IMPRESSION / MDM / ASSESSMENT AND PLAN / ED COURSE  I reviewed the triage vital signs and the nursing notes.                                Patient's presentation is most consistent with acute presentation with potential threat to life or bodily function.  Differential diagnosis includes, but is not limited to, acute blood loss anemia, anterior versus posterior epistaxis, septal hematoma, trauma   The patient is on the cardiac monitor to evaluate for evidence of arrhythmia and/or significant heart rate changes.  MDM:    Atraumatic nosebleeds in this person on home O2, none currently and no lesions noted in the nares bilaterally.  Looks well with normal hemodynamics will check H&H to see if any significant anemia requiring blood transfusion though low suspicion.  I think likely due to her O2 therapy and dry air due to heat causing irritation to the nares.  Will give Afrin and nasal clamp to take home and advised to administer O2 via mouth during this therapy if needed.  Also advised on humidified air, asked PMD for humidified oxygen, nasal saline sprays to prevent dryness.  Anticipate if H&H unremarkable plan will be for discharge.       FINAL CLINICAL IMPRESSION(S) / ED DIAGNOSES   Final diagnoses:  Right-sided epistaxis     Rx / DC Orders   ED Discharge Orders     None        Note:  This document was prepared using Dragon voice recognition software and may include unintentional dictation errors.    Pilar Jarvis, MD 11/09/23 1255

## 2024-05-29 DIAGNOSIS — R0609 Other forms of dyspnea: Secondary | ICD-10-CM | POA: Diagnosis not present

## 2024-05-29 DIAGNOSIS — F17210 Nicotine dependence, cigarettes, uncomplicated: Secondary | ICD-10-CM | POA: Diagnosis not present

## 2024-05-29 DIAGNOSIS — J449 Chronic obstructive pulmonary disease, unspecified: Secondary | ICD-10-CM | POA: Diagnosis not present

## 2024-05-29 DIAGNOSIS — Z1331 Encounter for screening for depression: Secondary | ICD-10-CM | POA: Diagnosis not present

## 2024-05-29 DIAGNOSIS — Z9981 Dependence on supplemental oxygen: Secondary | ICD-10-CM | POA: Diagnosis not present

## 2024-05-29 DIAGNOSIS — R0902 Hypoxemia: Secondary | ICD-10-CM | POA: Diagnosis not present

## 2024-05-29 DIAGNOSIS — R0602 Shortness of breath: Secondary | ICD-10-CM | POA: Diagnosis not present

## 2024-06-06 ENCOUNTER — Telehealth: Payer: Self-pay

## 2024-06-06 NOTE — Telephone Encounter (Signed)
 The patient called to inquire if she was scheduled for her colonoscopy. I informed her that she is established with [facility] and currently has an office visit scheduled for 08/09/24 at 3:00 pm. The patient thanked me and explained that her grandson had her phone, so she was unsure what had happened. She requested to speak with someone at [facility], and I provided her with their phone number [phone]). I also transferred her call to the [facility] office.

## 2024-07-13 ENCOUNTER — Ambulatory Visit
Admission: EM | Admit: 2024-07-13 | Discharge: 2024-07-13 | Disposition: A | Discharge status: Home / Self Care | Attending: Family Medicine | Admitting: Family Medicine

## 2024-07-13 ENCOUNTER — Encounter: Payer: Self-pay | Admitting: Emergency Medicine

## 2024-07-13 ENCOUNTER — Ambulatory Visit (INDEPENDENT_AMBULATORY_CARE_PROVIDER_SITE_OTHER)

## 2024-07-13 DIAGNOSIS — F17210 Nicotine dependence, cigarettes, uncomplicated: Secondary | ICD-10-CM | POA: Diagnosis not present

## 2024-07-13 DIAGNOSIS — B379 Candidiasis, unspecified: Secondary | ICD-10-CM | POA: Diagnosis not present

## 2024-07-13 DIAGNOSIS — R0602 Shortness of breath: Secondary | ICD-10-CM

## 2024-07-13 DIAGNOSIS — M545 Low back pain, unspecified: Secondary | ICD-10-CM

## 2024-07-13 DIAGNOSIS — Z9981 Dependence on supplemental oxygen: Secondary | ICD-10-CM

## 2024-07-13 DIAGNOSIS — J441 Chronic obstructive pulmonary disease with (acute) exacerbation: Secondary | ICD-10-CM | POA: Diagnosis not present

## 2024-07-13 DIAGNOSIS — R0902 Hypoxemia: Secondary | ICD-10-CM

## 2024-07-13 LAB — URINALYSIS, W/ REFLEX TO CULTURE (INFECTION SUSPECTED)
Bilirubin Urine: NEGATIVE
Glucose, UA: NEGATIVE mg/dL
Hgb urine dipstick: NEGATIVE
Ketones, ur: NEGATIVE mg/dL
Leukocytes,Ua: NEGATIVE
Nitrite: NEGATIVE
Protein, ur: >300 mg/dL — AB
Specific Gravity, Urine: >1.03 — ABNORMAL HIGH (ref 1.005–1.030)
pH: 7 (ref 5.0–8.0)

## 2024-07-13 LAB — RESP PANEL BY RT-PCR (FLU A&B, COVID) ARPGX2
Influenza A by PCR: NEGATIVE
Influenza B by PCR: NEGATIVE
SARS Coronavirus 2 by RT PCR: NEGATIVE

## 2024-07-13 MED ORDER — IPRATROPIUM-ALBUTEROL 0.5-2.5 (3) MG/3ML IN SOLN
3.0000 mL | Freq: Once | RESPIRATORY_TRACT | Status: AC
  Administered 2024-07-13: 3 mL via RESPIRATORY_TRACT

## 2024-07-13 MED ORDER — FLUCONAZOLE 150 MG PO TABS: 150.0000 mg | ORAL_TABLET | ORAL | 0 refills | Status: AC

## 2024-07-13 MED ORDER — PREDNISONE 10 MG PO TABS: ORAL_TABLET | ORAL | 0 refills | Status: DC

## 2024-07-13 MED ORDER — METHOCARBAMOL 500 MG PO TABS: 500.0000 mg | ORAL_TABLET | Freq: Every evening | ORAL | 0 refills | Status: DC | PRN

## 2024-07-13 MED ORDER — AZITHROMYCIN 250 MG PO TABS: ORAL_TABLET | ORAL | 0 refills | Status: DC

## 2024-07-13 NOTE — ED Triage Notes (Signed)
 Pt c/o fever, headache, shortness of breath, nasal congestion. Started about 2 days ago. She states she was scratched by a cat about 3 days ago.

## 2024-07-13 NOTE — ED Provider Notes (Addendum)
 [facility]    CSN: [id number] Arrival date & time: 07/13/24  1540      History   Chief Complaint Chief Complaint  Patient presents with   cat scratch   Fever    HPI Gloria Jones is a 63 y.o. female.   HPI  History obtained from the patient. Gloria Jones presents for fever, flulike symptoms and shortness of breath and a recent cat scratch.  She scratched by her niece's cat 3 days ago.  Afterwards, started feeling sick.  She put some ointment on the area.  It is not swollen or had any drainage.  Notes that it is a little red in that spot.  Has bilateral lower back pain and headache. Put some rub-on pain reliever which helped.  She is concerned that she may have a urinary tract infection.  She has been urinating more frequently but there has not been any dysuria.  She has COPD.  She feels shortness of breath and has a productive cough.  She wears oxygen at bedtime when she goes to sleep.  Oxygen sat typically is 89-92%. Started needing oxygen all day the past couple of days.  She has been around other sick people.  Follow with a lung doctor, Dr Theotis.   She is trying to quit smoking. Smokes about 6 cigarettes.       Past Medical History:  Diagnosis Date   Allergic state    Asthma    Bronchitis    COPD (chronic obstructive pulmonary disease) (HCC)    History of kidney stones    Hypertension    Pre-diabetes    Pulmonary nodules    Respiratory failure (HCC)    chronic o2 2liters    Patient Active Problem List   Diagnosis Date Noted   Musculoskeletal pain 09/15/2023   Smoker 09/15/2023   Fall 09/15/2023   Marijuana use 09/15/2023   DDD (degenerative disc disease), cervical 09/15/2023   Influenza A with pneumonia 12/16/2022   Acute respiratory failure with hypoxia (HCC) 12/16/2022   COPD with acute exacerbation (HCC) 12/22/2018   Prediabetes 05/06/2015   Essential (primary) hypertension 09/06/2014    Past Surgical History:  Procedure Laterality  Date   COLONOSCOPY WITH PROPOFOL  N/A 06/28/2017   Procedure: COLONOSCOPY WITH PROPOFOL ;  Surgeon: Gaylyn Gladis PENNER, MD;  Location: [facility] ENDOSCOPY;  Service: Endoscopy;  Laterality: N/A;   COLONOSCOPY WITH PROPOFOL  N/A 03/25/2023   Procedure: COLONOSCOPY WITH PROPOFOL ;  Surgeon: Maryruth Ole DASEN, MD;  Location: [facility] ENDOSCOPY;  Service: Endoscopy;  Laterality: N/A;  DM   COLONOSCOPY WITH PROPOFOL  N/A 07/11/2023   Procedure: COLONOSCOPY WITH PROPOFOL ;  Surgeon: Maryruth Ole DASEN, MD;  Location: [facility] ENDOSCOPY;  Service: Endoscopy;  Laterality: N/A;   CYSTOSCOPY/URETEROSCOPY/HOLMIUM LASER/STENT PLACEMENT Right 07/30/2022   Procedure: CYSTOSCOPY/URETEROSCOPY/HOLMIUM LASER/STENT PLACEMENT;  Surgeon: Francisca Redell BROCKS, MD;  Location: [facility] ORS;  Service: Urology;  Laterality: Right;   ESOPHAGOGASTRODUODENOSCOPY (EGD) WITH PROPOFOL  N/A 06/28/2017   Procedure: ESOPHAGOGASTRODUODENOSCOPY (EGD) WITH PROPOFOL ;  Surgeon: Gaylyn Gladis PENNER, MD;  Location: [facility] ENDOSCOPY;  Service: Endoscopy;  Laterality: N/A;   KNEE SURGERY Left    meniscus repair   OVARY SURGERY Left 12/1977   partial ovary and fallopian tube removed   POLYPECTOMY  07/11/2023   Procedure: POLYPECTOMY;  Surgeon: Maryruth Ole DASEN, MD;  Location: [facility] ENDOSCOPY;  Service: Endoscopy;;    OB History   No obstetric history on file.      Home Medications    Prior to Admission medications   Medication Sig  Start Date End Date Taking? Authorizing Provider  albuterol  (PROVENTIL  HFA;VENTOLIN  HFA) 108 (90 BASE) MCG/ACT inhaler Inhale 1-2 puffs into the lungs every 4 (four) hours as needed for wheezing or shortness of breath. 05/24/15  Yes Betancourt, Ellouise LABOR, NP  albuterol  (PROVENTIL ) (2.5 MG/3ML) 0.083% nebulizer solution Take 3 mLs (2.5 mg total) by nebulization every 4 (four) hours as needed for wheezing or shortness of breath. 04/26/23 07/13/24 Yes Angelena Smalls, MD  aspirin  EC 81 MG tablet Take 81 mg by mouth daily. Swallow whole.   Yes  [provider]  azithromycin  (ZITHROMAX  Z-PAK) 250 MG tablet Take 2 tablets on day 1 then 1 tablet daily 07/13/24  Yes Gloria Rochin, DO  DULoxetine (CYMBALTA) 30 MG capsule Take 30 mg by mouth 2 (two) times daily.   Yes [provider]  fluconazole  (DIFLUCAN ) 150 MG tablet Take 1 tablet (150 mg total) by mouth once a week for 2 doses. 07/13/24 07/21/24 Yes Gloria Filter, DO  methocarbamol  (ROBAXIN ) 500 MG tablet Take 1 tablet (500 mg total) by mouth at bedtime as needed for muscle spasms. 07/13/24  Yes Gloria Kuras, DO  predniSONE  (DELTASONE ) 10 MG tablet Take 6 tabs by mouth daily  for 2 days, then 5 tabs for 2 days, then 4 tabs for 2 days, then 3 tabs for 2 days, 2 tabs for 2 days, then 1 tab by mouth daily for 2 days 07/13/24  Yes Gloria Wissinger, DO  spironolactone (ALDACTONE) 25 MG tablet Take by mouth. 12/27/18  Yes [provider]  Cyanocobalamin (VITAMIN B12) 1000 MCG TBCR Take by mouth.    [provider]  ezetimibe  (ZETIA ) 10 MG tablet Take 1 tablet by mouth daily. 07/06/22 07/06/23  [provider]  Fluticasone -Umeclidin-Vilant (TRELEGY ELLIPTA ) 100-62.5-25 MCG/ACT AEPB Take as directed - one dose every 24 hours for COPD. 04/26/23   Angelena Smalls, MD  ipratropium-albuterol  (DUONEB) 0.5-2.5 (3) MG/3ML SOLN Take 3 mLs by nebulization every 6 (six) hours as needed. 12/19/22   [place], Camellia PARAS, DO  oxyCODONE -acetaminophen  (PERCOCET) 5-325 MG tablet Take 1 tablet by mouth every 4 (four) hours as needed for severe pain. 09/27/23 09/26/24  Gasper Devere ORN, PA-C  traZODone (DESYREL) 50 MG tablet Take by mouth. 08/16/22   [provider]    Family History Family History  Problem Relation Age of Onset   Hypertension Mother    Colon cancer Maternal Uncle    Breast cancer Maternal Aunt        mat great aunt    Social History Social History   Tobacco Use   Smoking status: Every Day    Current packs/day: 1.00    Types: Cigarettes    Passive  exposure: Current   Smokeless tobacco: Never  Vaping Use   Vaping status: Never Used  Substance Use Topics   Alcohol use: Not Currently    Comment: occasionally,non last 24hrs   Drug use: Yes    Types: Marijuana    Comment: daily     Allergies   Simvastatin, Statins, Fish allergy, Lisinopril, Losartan, and Pravastatin   Review of Systems Review of Systems: negative unless otherwise stated in HPI.      Physical Exam Triage Vital Signs ED Triage Vitals  Encounter Vitals Group     BP 07/13/24 1625 (!) 139/93     Girls Systolic BP Percentile --      Girls Diastolic BP Percentile --      Boys Systolic BP Percentile --      Boys  Diastolic BP Percentile --      Pulse Rate 07/13/24 1625 91     Resp 07/13/24 1625 20     Temp 07/13/24 1625 98.9 F (37.2 C)     Temp Source 07/13/24 1625 Oral     SpO2 07/13/24 1625 (!) 88 %     Weight 07/13/24 1623 194 lb 0.1 oz (88 kg)     Height 07/13/24 1623 5' 3 (1.6 m)     Head Circumference --      Peak Flow --      Pain Score 07/13/24 1623 9     Pain Loc --      Pain Education --      Exclude from Growth Chart --    No data found.  Updated Vital Signs BP (!) 139/93 (BP Location: Right Arm)   Pulse 91   Temp 98.9 F (37.2 C) (Oral)   Resp 20   Ht 5' 3 (1.6 m)   Wt 88 kg   LMP 02/08/2015   SpO2 94%   BMI 34.37 kg/m   Visual Acuity Right Eye Distance:   Left Eye Distance:   Bilateral Distance:    Right Eye Near:   Left Eye Near:    Bilateral Near:     Physical Exam GEN:     alert, non-toxic appearing female in no distress    HENT:  mucus membranes moist, oropharyngeal without lesions or erythema, no nasal discharge EYES:  no scleral injection or discharge NECK:  normal ROM, no lymphadenopathy, no midline C-spine tenderness or paraspinal tenderness RESP:  no increased work of breathing, faint expiratory wheezing with coarse breath sounds bilaterally, wearing 3 L nasal cannula CVS:   regular rate and rhythm ABD:  Soft, nontender, nondistended, no palpable masses, no CVA tenderness MSK: Lumbar spine: - Inspection: no gross deformity or asymmetry, swelling or ecchymosis. No skin changes - Palpation: No TTP over the spinous processes, + hypertonicity of paraspinal muscles without tenderness, - ROM: Good active ROM of the lumbar spine  - Strength: 5/5 strength of lower extremity  - Neuro: sensation intact in the L4-S1 nerve root distribution b/l - Special testing: Negative straight leg raise Skin:   warm and dry, left lower leg with excoriations from cat scratch without surrounding erythema, no fluctuance, no discharge    UC Treatments / Results  Labs (all labs ordered are listed, but only abnormal results are displayed) Labs Reviewed  URINALYSIS, W/ REFLEX TO CULTURE (INFECTION SUSPECTED) - Abnormal; Notable for the following components:      Result Value   Specific Gravity, Urine >1.030 (*)    Protein, ur >300 (*)    Bacteria, UA FEW (*)    All other components within normal limits  RESP PANEL BY RT-PCR (FLU A&B, COVID) ARPGX2    EKG   Radiology DG Chest 2 View Result Date: 07/13/2024 CLINICAL DATA:  cough and SOB EXAM: CHEST - 2 VIEW COMPARISON:  Apr 26, 2023, June 29, 2023 FINDINGS: Chronic coarsening of the pulmonary interstitium without overt pulmonary edema. No focal airspace consolidation, pleural effusion, or pneumothorax. Mild cardiomegaly. Aortic atherosclerosis. No acute fracture or destructive lesions. Multilevel thoracic osteophytosis. IMPRESSION: No acute cardiopulmonary abnormality. Electronically Signed   By: Rogelia Myers M.D.   On: 07/13/2024 17:37     Procedures Procedures (including critical care time)  Medications Ordered in UC Medications  ipratropium-albuterol  (DUONEB) 0.5-2.5 (3) MG/3ML nebulizer solution 3 mL (3 mLs Nebulization Given 07/13/24 1712)  ipratropium-albuterol  (DUONEB) 0.5-2.5 (3)  MG/3ML nebulizer solution 3 mL (3 mLs Nebulization Given 07/13/24 1741)     Initial Impression / Assessment and Plan / UC Course  I have reviewed the triage vital signs and the nursing notes.  Pertinent labs & imaging results that were available during my care of the patient were reviewed by me and considered in my medical decision making (see chart for details).        Pt is a 63 y.o. female who presents for several different concerns.  Gloria Jones is afebrile here without recent antipyretics. Satting 88% on 2L.  Overall pt is non-toxic appearing, well hydrated, without respiratory distress.  Surprisingly, she does not appear to be in distress.  She does have severe COPD per Dr. Aletha note from 05/29/2024 her PFTs showed evidence of severe obstruction on spirometry.  Pulmonary exam is remarkable for faint scattered expiratory wheezing and coarse breath sounds with decreased air movement bilaterally.  Patient given 2 DuoNeb's with some improvement.  We also turned her oxygen concentrator up to 3 L.  COVID and influenza panel obtained and was negative.  I suspect a COPD exacerbation.  She is satting at 94% on 3 L.  Her oxygen continues to drop when speaking and she is often on the phone and to me.  Chest x-ray obtained and personally viewed by me showing no cardiomegaly, pleural effusion, significant lung masses, rib fracture or pneumothorax.  Radiologist notes chronic coarsening of the pulmonary interstitium without overt pulmonary edema.  Discussed ED evaluation but patient states she would rather turn her oxygen up and wear it for the next 48 hours while the medications kick in.  If not improving with the prescribed steroids and antibiotics below, she will go to the emergency department.  On Monday, she will contact her pulmonologist, Dr. Theotis for additional recommendations.  For her back pain a urinalysis was obtained and did not show any evidence of acute cystitis.  She does have some proteinuria and budding yeast in her urine.  Treat with Diflucan  for yeast  infection.  There was no hematuria on microscopy to suggest a kidney stone.  Suspect muscular strain.  Prescribed Robaxin  at bedtime.  Multimodal pain relief discussed.  Return and ED precautions given and voiced understanding. Discussed MDM, treatment plan and plan for follow-up with patient who agrees with plan.     Final Clinical Impressions(s) / UC Diagnoses   Final diagnoses:  SOB (shortness of breath)  COPD exacerbation (HCC)  Acute bilateral low back pain without sciatica  Cigarette smoker  Oxygen desaturation  On home oxygen therapy     Discharge Instructions      Gloria Jones, your influenza and COVID are negative. You have a likely have a viral respiratory infection that will gradually improve over the next 7-10 days. Cough may last up to 3 weeks.  Given your COPD, we will treat you with antibiotics and a long steroid taper.  Stop by the pharmacy to pick up your prescriptions.  Follow-up with your pulmonologist in the next week.  If your symptoms worsen or are not improving, go to the hospital emergency department.   Your urine sample did not show evidence of a urinary tract infection however it did show some yeast.  Stop at the pharmacy to pick up your antifungal medication called Diflucan /fluconazole .   For your back pain take Tylenol  1000 mg 3 times a day.  Apply a warm compress 3-4 times a day for 20 minutes.  Stop by the pharmacy and purchase some  lidocaine  patches/Salonpas and apply to painful area.  Keep the patch on for 12 hours then remove for 12 hours and repeat the next day. Use the muscle relaxer at bedtime.      ED Prescriptions     Medication Sig Dispense Auth. Provider   fluconazole  (DIFLUCAN ) 150 MG tablet Take 1 tablet (150 mg total) by mouth once a week for 2 doses. 2 tablet Gloria Reddick, DO   azithromycin  (ZITHROMAX  Z-PAK) 250 MG tablet Take 2 tablets on day 1 then 1 tablet daily 6 tablet Gloria Clerk, DO   predniSONE  (DELTASONE ) 10 MG tablet  Take 6 tabs by mouth daily  for 2 days, then 5 tabs for 2 days, then 4 tabs for 2 days, then 3 tabs for 2 days, 2 tabs for 2 days, then 1 tab by mouth daily for 2 days 20 tablet Gloria Cisse, DO   methocarbamol  (ROBAXIN ) 500 MG tablet Take 1 tablet (500 mg total) by mouth at bedtime as needed for muscle spasms. 20 tablet Gloria Goranson, DO      PDMP not reviewed this encounter.      Gloria Wickersham, DO 07/16/24 1542

## 2024-07-13 NOTE — Discharge Instructions (Addendum)
 Gloria Jones, your influenza and COVID are negative. You have a likely have a viral respiratory infection that will gradually improve over the next 7-10 days. Cough may last up to 3 weeks.  Given your COPD, we will treat you with antibiotics and a long steroid taper.  Stop by the pharmacy to pick up your prescriptions.  Follow-up with your pulmonologist in the next week.  If your symptoms worsen or are not improving, go to the hospital emergency department.   Your urine sample did not show evidence of a urinary tract infection however it did show some yeast.  Stop at the pharmacy to pick up your antifungal medication called Diflucan/fluconazole.   For your back pain take Tylenol  1000 mg 3 times a day.  Apply a warm compress 3-4 times a day for 20 minutes.  Stop by the pharmacy and purchase some lidocaine  patches/Salonpas and apply to painful area.  Keep the patch on for 12 hours then remove for 12 hours and repeat the next day. Use the muscle relaxer at bedtime.

## 2024-08-09 DIAGNOSIS — Z860100 Personal history of colon polyps, unspecified: Secondary | ICD-10-CM | POA: Diagnosis not present

## 2024-08-09 DIAGNOSIS — E785 Hyperlipidemia, unspecified: Secondary | ICD-10-CM | POA: Diagnosis not present

## 2024-08-09 DIAGNOSIS — I10 Essential (primary) hypertension: Secondary | ICD-10-CM | POA: Diagnosis not present

## 2024-08-09 DIAGNOSIS — J449 Chronic obstructive pulmonary disease, unspecified: Secondary | ICD-10-CM | POA: Diagnosis not present

## 2024-08-25 ENCOUNTER — Inpatient Hospital Stay
Admission: EM | Admit: 2024-08-25 | Discharge: 2024-08-27 | DRG: 193 | Disposition: A | Discharge status: Home / Self Care | Source: Ambulatory Visit | Attending: Student | Admitting: Student

## 2024-08-25 ENCOUNTER — Emergency Department

## 2024-08-25 ENCOUNTER — Other Ambulatory Visit: Payer: Self-pay

## 2024-08-25 ENCOUNTER — Ambulatory Visit: Admission: EM | Admit: 2024-08-25 | Discharge: 2024-08-25

## 2024-08-25 ENCOUNTER — Encounter: Payer: Self-pay | Admitting: Emergency Medicine

## 2024-08-25 DIAGNOSIS — E559 Vitamin D deficiency, unspecified: Secondary | ICD-10-CM | POA: Diagnosis not present

## 2024-08-25 DIAGNOSIS — Z8249 Family history of ischemic heart disease and other diseases of the circulatory system: Secondary | ICD-10-CM

## 2024-08-25 DIAGNOSIS — M7121 Synovial cyst of popliteal space [Baker], right knee: Secondary | ICD-10-CM | POA: Diagnosis present

## 2024-08-25 DIAGNOSIS — J9602 Acute respiratory failure with hypercapnia: Secondary | ICD-10-CM | POA: Diagnosis present

## 2024-08-25 DIAGNOSIS — R911 Solitary pulmonary nodule: Secondary | ICD-10-CM | POA: Diagnosis not present

## 2024-08-25 DIAGNOSIS — J9601 Acute respiratory failure with hypoxia: Secondary | ICD-10-CM | POA: Diagnosis present

## 2024-08-25 DIAGNOSIS — J168 Pneumonia due to other specified infectious organisms: Secondary | ICD-10-CM | POA: Diagnosis not present

## 2024-08-25 DIAGNOSIS — R0602 Shortness of breath: Principal | ICD-10-CM

## 2024-08-25 DIAGNOSIS — M79661 Pain in right lower leg: Secondary | ICD-10-CM | POA: Diagnosis not present

## 2024-08-25 DIAGNOSIS — J9621 Acute and chronic respiratory failure with hypoxia: Secondary | ICD-10-CM | POA: Diagnosis not present

## 2024-08-25 DIAGNOSIS — Z1152 Encounter for screening for COVID-19: Secondary | ICD-10-CM | POA: Diagnosis not present

## 2024-08-25 DIAGNOSIS — M7989 Other specified soft tissue disorders: Secondary | ICD-10-CM | POA: Diagnosis not present

## 2024-08-25 DIAGNOSIS — R7303 Prediabetes: Secondary | ICD-10-CM | POA: Diagnosis present

## 2024-08-25 DIAGNOSIS — Z7951 Long term (current) use of inhaled steroids: Secondary | ICD-10-CM

## 2024-08-25 DIAGNOSIS — M79604 Pain in right leg: Secondary | ICD-10-CM | POA: Diagnosis not present

## 2024-08-25 DIAGNOSIS — I5030 Unspecified diastolic (congestive) heart failure: Secondary | ICD-10-CM | POA: Diagnosis not present

## 2024-08-25 DIAGNOSIS — J189 Pneumonia, unspecified organism: Principal | ICD-10-CM | POA: Diagnosis present

## 2024-08-25 DIAGNOSIS — Z803 Family history of malignant neoplasm of breast: Secondary | ICD-10-CM | POA: Diagnosis not present

## 2024-08-25 DIAGNOSIS — I110 Hypertensive heart disease with heart failure: Secondary | ICD-10-CM | POA: Diagnosis not present

## 2024-08-25 DIAGNOSIS — I10 Essential (primary) hypertension: Secondary | ICD-10-CM | POA: Diagnosis present

## 2024-08-25 DIAGNOSIS — Z888 Allergy status to other drugs, medicaments and biological substances status: Secondary | ICD-10-CM

## 2024-08-25 DIAGNOSIS — Z7982 Long term (current) use of aspirin: Secondary | ICD-10-CM | POA: Diagnosis not present

## 2024-08-25 DIAGNOSIS — I5033 Acute on chronic diastolic (congestive) heart failure: Secondary | ICD-10-CM | POA: Diagnosis present

## 2024-08-25 DIAGNOSIS — J440 Chronic obstructive pulmonary disease with acute lower respiratory infection: Secondary | ICD-10-CM | POA: Diagnosis present

## 2024-08-25 DIAGNOSIS — J432 Centrilobular emphysema: Secondary | ICD-10-CM | POA: Diagnosis not present

## 2024-08-25 DIAGNOSIS — Z800 Family history of malignant neoplasm of digestive organs: Secondary | ICD-10-CM

## 2024-08-25 DIAGNOSIS — F17210 Nicotine dependence, cigarettes, uncomplicated: Secondary | ICD-10-CM | POA: Diagnosis not present

## 2024-08-25 DIAGNOSIS — Z79899 Other long term (current) drug therapy: Secondary | ICD-10-CM | POA: Diagnosis not present

## 2024-08-25 DIAGNOSIS — Z9981 Dependence on supplemental oxygen: Secondary | ICD-10-CM | POA: Diagnosis not present

## 2024-08-25 DIAGNOSIS — R918 Other nonspecific abnormal finding of lung field: Secondary | ICD-10-CM | POA: Diagnosis not present

## 2024-08-25 DIAGNOSIS — Z91018 Allergy to other foods: Secondary | ICD-10-CM | POA: Diagnosis not present

## 2024-08-25 DIAGNOSIS — J441 Chronic obstructive pulmonary disease with (acute) exacerbation: Secondary | ICD-10-CM | POA: Diagnosis present

## 2024-08-25 LAB — CREATININE, SERUM
Creatinine, Ser: 0.57 mg/dL (ref 0.44–1.00)
GFR, Estimated: >60 mL/min (ref >60)

## 2024-08-25 LAB — CBC
HCT: 55.9 % — ABNORMAL HIGH (ref 36.0–46.0)
HCT: 57.5 % — ABNORMAL HIGH (ref 36.0–46.0)
Hemoglobin: 17.2 g/dL — ABNORMAL HIGH (ref 12.0–15.0)
Hemoglobin: 18.3 g/dL — ABNORMAL HIGH (ref 12.0–15.0)
MCH: 31.1 pg (ref 26.0–34.0)
MCH: 31.3 pg (ref 26.0–34.0)
MCHC: 30.8 g/dL (ref 30.0–36.0)
MCHC: 31.8 g/dL (ref 30.0–36.0)
MCV: 101.1 fL — ABNORMAL HIGH (ref 80.0–100.0)
MCV: 98.3 fL (ref 80.0–100.0)
Platelets: 169 K/uL (ref 150–400)
Platelets: 192 K/uL (ref 150–400)
RBC: 5.53 MIL/uL — ABNORMAL HIGH (ref 3.87–5.11)
RBC: 5.85 MIL/uL — ABNORMAL HIGH (ref 3.87–5.11)
RDW: 13 % (ref 11.5–15.5)
RDW: 12.9 % (ref 11.5–15.5)
WBC: 9.7 K/uL (ref 4.0–10.5)
WBC: 9.3 K/uL (ref 4.0–10.5)
nRBC: 0 % (ref 0.0–0.2)
nRBC: 0 % (ref 0.0–0.2)

## 2024-08-25 LAB — TROPONIN I (HIGH SENSITIVITY): Troponin I (High Sensitivity): 9 ng/L (ref <18)

## 2024-08-25 LAB — BASIC METABOLIC PANEL WITH GFR
Anion gap: 8 (ref 5–15)
BUN: 12 mg/dL (ref 8–23)
CO2: 34 mmol/L — ABNORMAL HIGH (ref 22–32)
Calcium: 8.7 mg/dL — ABNORMAL LOW (ref 8.9–10.3)
Chloride: 98 mmol/L (ref 98–111)
Creatinine, Ser: 0.71 mg/dL (ref 0.44–1.00)
GFR, Estimated: >60 mL/min (ref >60)
Glucose, Bld: 107 mg/dL — ABNORMAL HIGH (ref 70–99)
Potassium: 4.1 mmol/L (ref 3.5–5.1)
Sodium: 140 mmol/L (ref 135–145)

## 2024-08-25 LAB — RESP PANEL BY RT-PCR (RSV, FLU A&B, COVID)  RVPGX2
Influenza A by PCR: NEGATIVE
Influenza B by PCR: NEGATIVE
Resp Syncytial Virus by PCR: NEGATIVE
SARS Coronavirus 2 by RT PCR: NEGATIVE

## 2024-08-25 LAB — BRAIN NATRIURETIC PEPTIDE: B Natriuretic Peptide: 49 pg/mL (ref 0.0–100.0)

## 2024-08-25 MED ORDER — SODIUM CHLORIDE 0.9 % IV SOLN
250.0000 mL | INTRAVENOUS | Status: AC | PRN
  Administered 2024-08-25: 250 mL via INTRAVENOUS

## 2024-08-25 MED ORDER — SODIUM CHLORIDE 0.9 % IV SOLN
1.0000 g | Freq: Once | INTRAVENOUS | Status: AC
  Administered 2024-08-25: 1 g via INTRAVENOUS
  Filled 2024-08-25: qty 10

## 2024-08-25 MED ORDER — SODIUM CHLORIDE 0.9 % IV SOLN
500.0000 mg | INTRAVENOUS | Status: DC
  Administered 2024-08-26: 500 mg via INTRAVENOUS
  Filled 2024-08-25: qty 5

## 2024-08-25 MED ORDER — ENOXAPARIN SODIUM 40 MG/0.4ML IJ SOSY
40.0000 mg | PREFILLED_SYRINGE | INTRAMUSCULAR | Status: DC
  Administered 2024-08-25 – 2024-08-26 (×2): 40 mg via SUBCUTANEOUS
  Filled 2024-08-25 (×2): qty 0.4

## 2024-08-25 MED ORDER — FUROSEMIDE 10 MG/ML IJ SOLN
40.0000 mg | Freq: Two times a day (BID) | INTRAMUSCULAR | Status: DC
  Administered 2024-08-26: 40 mg via INTRAVENOUS
  Filled 2024-08-25: qty 4

## 2024-08-25 MED ORDER — FUROSEMIDE 10 MG/ML IJ SOLN
40.0000 mg | Freq: Once | INTRAMUSCULAR | Status: AC
  Administered 2024-08-25: 40 mg via INTRAVENOUS
  Filled 2024-08-25: qty 4

## 2024-08-25 MED ORDER — SODIUM CHLORIDE 0.9 % IV SOLN
500.0000 mg | Freq: Once | INTRAVENOUS | Status: AC
  Administered 2024-08-26: 500 mg via INTRAVENOUS
  Filled 2024-08-25: qty 5

## 2024-08-25 MED ORDER — ACETAMINOPHEN 325 MG PO TABS
650.0000 mg | ORAL_TABLET | ORAL | Status: DC | PRN
  Administered 2024-08-26 – 2024-08-27 (×2): 650 mg via ORAL
  Filled 2024-08-25 (×3): qty 2

## 2024-08-25 MED ORDER — SODIUM CHLORIDE 0.9 % IV SOLN
1.0000 g | INTRAVENOUS | Status: DC
  Administered 2024-08-26: 1 g via INTRAVENOUS
  Filled 2024-08-25: qty 10

## 2024-08-25 MED ORDER — ONDANSETRON HCL 4 MG/2ML IJ SOLN
4.0000 mg | Freq: Four times a day (QID) | INTRAMUSCULAR | Status: DC | PRN
Start: 2024-08-25 — End: 2024-08-27

## 2024-08-25 MED ORDER — SODIUM CHLORIDE 0.9% FLUSH
3.0000 mL | Freq: Two times a day (BID) | INTRAVENOUS | Status: DC
  Administered 2024-08-25 – 2024-08-27 (×4): 3 mL via INTRAVENOUS

## 2024-08-25 MED ORDER — SODIUM CHLORIDE 0.9% FLUSH: 3.0000 mL | INTRAVENOUS | Status: DC | PRN

## 2024-08-25 MED ORDER — IOHEXOL 350 MG/ML SOLN
100.0000 mL | Freq: Once | INTRAVENOUS | Status: AC | PRN
  Administered 2024-08-25: 100 mL via INTRAVENOUS

## 2024-08-25 NOTE — ED Provider Notes (Signed)
 [facility] Provider Note    Event Date/Time   First MD Initiated Contact with Patient 08/25/24 1605     (approximate)   History   Chief Complaint Shortness of Breath   HPI  Gloria Jones is a 63 y.o. female with past medical history of hypertension and COPD on 2 L who presents to the ED complaining of shortness of breath.  Patient reports that she has had 3 days of increasing difficulty breathing along with intermittent sharp pain in her chest.  She denies any pain in her chest currently, but has had some pain and swelling in her right leg, particularly behind her right knee, where she has noticed a nodule.  She denies any history of DVT/PE and does not take a blood thinner.     Physical Exam   Triage Vital Signs: ED Triage Vitals  Encounter Vitals Group     BP 08/25/24 1600 (!) 172/100     Girls Systolic BP Percentile --      Girls Diastolic BP Percentile --      Boys Systolic BP Percentile --      Boys Diastolic BP Percentile --      Pulse Rate 08/25/24 1600 86     Resp 08/25/24 1600 18     Temp 08/25/24 1600 98.3 F (36.8 C)     Temp Source 08/25/24 1600 Oral     SpO2 08/25/24 1559 (!) 85 %     Weight 08/25/24 1602 194 lb 0.1 oz (88 kg)     Height 08/25/24 1602 5' 3 (1.6 m)     Head Circumference --      Peak Flow --      Pain Score 08/25/24 1602 10     Pain Loc --      Pain Education --      Exclude from Growth Chart --     Most recent vital signs: Vitals:   08/25/24 1830 08/25/24 2026  BP: (!) 149/84   Pulse: 77 75  Resp: 17 16  Temp:  98.5 F (36.9 C)  SpO2: 94% 92%    Constitutional: Alert and oriented. Eyes: Conjunctivae are normal. Head: Atraumatic. Nose: No congestion/rhinnorhea. Mouth/Throat: Mucous membranes are moist.  Cardiovascular: Normal rate, regular rhythm. Grossly normal heart sounds.  2+ radial and DP pulses bilaterally. Respiratory: Normal respiratory effort.  No retractions. Lungs  CTAB. Gastrointestinal: Soft and nontender. No distention. Musculoskeletal: Trace edema to the right lower extremity with tenderness to palpation behind the right knee, no erythema or warmth noted. Neurologic:  Normal speech and language. No gross focal neurologic deficits are appreciated.    ED Results / Procedures / Treatments   Labs (all labs ordered are listed, but only abnormal results are displayed) Labs Reviewed  BASIC METABOLIC PANEL WITH GFR - Abnormal; Notable for the following components:      Result Value   CO2 34 (*)    Glucose, Bld 107 (*)    Calcium 8.7 (*)    All other components within normal limits  CBC - Abnormal; Notable for the following components:   RBC 5.53 (*)    Hemoglobin 17.2 (*)    HCT 55.9 (*)    MCV 101.1 (*)    All other components within normal limits  RESP PANEL BY RT-PCR (RSV, FLU A&B, COVID)  RVPGX2  BRAIN NATRIURETIC PEPTIDE  TROPONIN I (HIGH SENSITIVITY)     EKG  ED ECG REPORT I, Carlin Palin, the attending physician, personally viewed  and interpreted this ECG.   Date: 08/25/2024  EKG Time: 16:03  Rate: 87  Rhythm: normal sinus rhythm  Axis: Normal  Intervals:none  ST&T Change: None  RADIOLOGY Right lower extremity ultrasound reviewed and interpreted by me with no evidence of DVT.  PROCEDURES:  Critical Care performed: No  Procedures   MEDICATIONS ORDERED IN ED: Medications  cefTRIAXone  (ROCEPHIN ) 1 g in sodium chloride  0.9 % 100 mL IVPB (1 g Intravenous New Bag/Given 08/25/24 2030)  azithromycin  (ZITHROMAX ) 500 mg in sodium chloride  0.9 % 250 mL IVPB (has no administration in time range)  iohexol  (OMNIPAQUE ) 350 MG/ML injection 100 mL (100 mLs Intravenous Contrast Given 08/25/24 1916)  furosemide  (LASIX ) injection 40 mg (40 mg Intravenous Given 08/25/24 2030)     IMPRESSION / MDM / ASSESSMENT AND PLAN / ED COURSE  I reviewed the triage vital signs and the nursing notes.                              63 y.o. female  with past medical history of hypertension and COPD on 2 L who presents to the ED complaining of increasing difficulty breathing over the past 3 days with intermittent sharp chest pain as well as pain and swelling in her right leg.  Patient's presentation is most consistent with acute presentation with potential threat to life or bodily function.  Differential diagnosis includes, but is not limited to, DVT, PE, ACS, COPD exacerbation, pneumonia, CHF, anemia, electrolyte abnormality, AKI.   Patient nontoxic-appearing and in no acute distress, vital signs initially remarkable for mild hypoxia, improved on 3 L nasal cannula.  No significant wheezing noted on exam but patient does have symptoms concerning for right lower extremity DVT.  We will check CTA chest as well as ultrasound of her right lower extremity.  EKG shows no evidence of arrhythmia or ischemia, labs including troponin are pending at this time.  Labs without significant anemia, leukocytosis, electrolyte abnormality, or AKI.  Troponin within normal limits and I doubt ACS.  Right lower extremity ultrasound shows Baker's cyst but no evidence of DVT.  CTA chest is negative for PE, does show infiltrate concerning for pneumonia as well as interstitial edema.  We will diurese with IV Lasix  and treat with IV antibiotics.  Upon ambulation, patient feels very short of breath and drops oxygen saturations to 87% on 2 L.  Given worsening hypoxic respiratory failure, case discussed with hospitalist for admission.      FINAL CLINICAL IMPRESSION(S) / ED DIAGNOSES   Final diagnoses:  SOB (shortness of breath)  Pneumonia due to infectious organism, unspecified laterality, unspecified part of lung     Rx / DC Orders   ED Discharge Orders     None        Note:  This document was prepared using Dragon voice recognition software and may include unintentional dictation errors.   Willo Dunnings, MD 08/25/24 [phone]

## 2024-08-25 NOTE — ED Notes (Signed)
 Patient is being discharged from the Urgent Care and sent to the Emergency Department via POV . Per SOB & R calf pain, patient is in need of higher level of care due to Venetia Motto NP. Patient is aware and verbalizes understanding of plan of care.  Vitals:   08/25/24 1500 08/25/24 1502  BP: 129/86   Pulse: 84   Resp: 17   Temp: 99.1 F (37.3 C)   SpO2: (!) 88% 92%

## 2024-08-25 NOTE — ED Notes (Signed)
 Ultrasound still at bedside

## 2024-08-25 NOTE — Discharge Instructions (Addendum)
 Please go to the emergency department [facility] to be evaluated for your shortness of breath, increased oxygen demand, and right calf pain.  I am concerned you have a blood clot in the right calf and possibly one in your lung as well.  Please go now.

## 2024-08-25 NOTE — ED Provider Notes (Signed)
 [facility]    CSN: [id number] Arrival date & time: 08/25/24  1445      History   Chief Complaint Chief Complaint  Patient presents with   Leg Pain    Right lower leg    HPI Gloria Jones is a 63 y.o. female.   HPI  63 year old female with past medical history significant for prediabetes, hypertension, COPD, asthma presents for evaluation of painful swelling to the back of her right calf with radiation of the pain down into her right lower leg that started 3 days ago.  She denies any numbness or tingling in her foot.  No injury.  Additionally, she is currently on oxygen at 2 L/min with an oxygen saturation of 92%.  She typically does not have to use the oxygen during the day but over the last 3 days she has had increasing shortness of breath and increasing oxygen demand.  Past Medical History:  Diagnosis Date   Allergic state    Asthma    Bronchitis    COPD (chronic obstructive pulmonary disease) (HCC)    History of kidney stones    Hypertension    Pre-diabetes    Pulmonary nodules    Respiratory failure (HCC)    chronic o2 2liters    Patient Active Problem List   Diagnosis Date Noted   Musculoskeletal pain 09/15/2023   Smoker 09/15/2023   Fall 09/15/2023   Marijuana use 09/15/2023   DDD (degenerative disc disease), cervical 09/15/2023   Influenza A with pneumonia 12/16/2022   Acute respiratory failure with hypoxia (HCC) 12/16/2022   COPD with acute exacerbation (HCC) 12/22/2018   Prediabetes 05/06/2015   Essential (primary) hypertension 09/06/2014    Past Surgical History:  Procedure Laterality Date   COLONOSCOPY WITH PROPOFOL  N/A 06/28/2017   Procedure: COLONOSCOPY WITH PROPOFOL ;  Surgeon: Gaylyn Gladis PENNER, MD;  Location: [facility] ENDOSCOPY;  Service: Endoscopy;  Laterality: N/A;   COLONOSCOPY WITH PROPOFOL  N/A 03/25/2023   Procedure: COLONOSCOPY WITH PROPOFOL ;  Surgeon: Maryruth Ole DASEN, MD;  Location: [facility] ENDOSCOPY;  Service: Endoscopy;   Laterality: N/A;  DM   COLONOSCOPY WITH PROPOFOL  N/A 07/11/2023   Procedure: COLONOSCOPY WITH PROPOFOL ;  Surgeon: Maryruth Ole DASEN, MD;  Location: [facility] ENDOSCOPY;  Service: Endoscopy;  Laterality: N/A;   CYSTOSCOPY/URETEROSCOPY/HOLMIUM LASER/STENT PLACEMENT Right 07/30/2022   Procedure: CYSTOSCOPY/URETEROSCOPY/HOLMIUM LASER/STENT PLACEMENT;  Surgeon: Francisca Redell BROCKS, MD;  Location: [facility] ORS;  Service: Urology;  Laterality: Right;   ESOPHAGOGASTRODUODENOSCOPY (EGD) WITH PROPOFOL  N/A 06/28/2017   Procedure: ESOPHAGOGASTRODUODENOSCOPY (EGD) WITH PROPOFOL ;  Surgeon: Gaylyn Gladis PENNER, MD;  Location: [facility] ENDOSCOPY;  Service: Endoscopy;  Laterality: N/A;   KNEE SURGERY Left    meniscus repair   OVARY SURGERY Left 12/1977   partial ovary and fallopian tube removed   POLYPECTOMY  07/11/2023   Procedure: POLYPECTOMY;  Surgeon: Maryruth Ole DASEN, MD;  Location: [facility] ENDOSCOPY;  Service: Endoscopy;;    OB History   No obstetric history on file.      Home Medications    Prior to Admission medications   Medication Sig Start Date End Date Taking? Authorizing Provider  albuterol  (PROVENTIL  HFA;VENTOLIN  HFA) 108 (90 BASE) MCG/ACT inhaler Inhale 1-2 puffs into the lungs every 4 (four) hours as needed for wheezing or shortness of breath. 05/24/15   Betancourt, Ellouise LABOR, NP  albuterol  (PROVENTIL ) (2.5 MG/3ML) 0.083% nebulizer solution Take 3 mLs (2.5 mg total) by nebulization every 4 (four) hours as needed for wheezing or shortness of breath. 04/26/23 07/13/24  Angelena Ole, MD  aspirin  EC 81 MG tablet Take 81 mg by mouth daily. Swallow whole.    [provider]  azithromycin  (ZITHROMAX  Z-PAK) 250 MG tablet Take 2 tablets on day 1 then 1 tablet daily 07/13/24   Kriste Berth, DO  Cyanocobalamin  (VITAMIN B12) 1000 MCG TBCR Take by mouth.    [provider]  DULoxetine  (CYMBALTA ) 30 MG capsule Take 30 mg by mouth 2 (two) times daily.    [provider]  ezetimibe  (ZETIA ) 10 MG  tablet Take 1 tablet by mouth daily. 07/06/22 07/06/23  [provider]  Fluticasone -Umeclidin-Vilant (TRELEGY ELLIPTA ) 100-62.5-25 MCG/ACT AEPB Take as directed - one dose every 24 hours for COPD. 04/26/23   Angelena Smalls, MD  ipratropium-albuterol  (DUONEB) 0.5-2.5 (3) MG/3ML SOLN Take 3 mLs by nebulization every 6 (six) hours as needed. 12/19/22   [place], Camellia PARAS, DO  methocarbamol  (ROBAXIN ) 500 MG tablet Take 1 tablet (500 mg total) by mouth at bedtime as needed for muscle spasms. 07/13/24   Brimage, Vondra, DO  oxyCODONE -acetaminophen  (PERCOCET) 5-325 MG tablet Take 1 tablet by mouth every 4 (four) hours as needed for severe pain. 09/27/23 09/26/24  Fisher, Devere ORN, PA-C  predniSONE  (DELTASONE ) 10 MG tablet Take 6 tabs by mouth daily  for 2 days, then 5 tabs for 2 days, then 4 tabs for 2 days, then 3 tabs for 2 days, 2 tabs for 2 days, then 1 tab by mouth daily for 2 days 07/13/24   Brimage, Vondra, DO  spironolactone  (ALDACTONE ) 25 MG tablet Take by mouth. 12/27/18   [provider]  traZODone (DESYREL) 50 MG tablet Take by mouth. 08/16/22   [provider]    Family History Family History  Problem Relation Age of Onset   Hypertension Mother    Colon cancer Maternal Uncle    Breast cancer Maternal Aunt        mat great aunt    Social History Social History   Tobacco Use   Smoking status: Every Day    Current packs/day: 1.00    Types: Cigarettes    Passive exposure: Current   Smokeless tobacco: Never  Vaping Use   Vaping status: Never Used  Substance Use Topics   Alcohol use: Not Currently    Comment: occasionally,non last 24hrs   Drug use: Yes    Types: Marijuana    Comment: daily     Allergies   Simvastatin, Statins, Fish allergy, Lisinopril, Losartan, and Pravastatin   Review of Systems Review of Systems  Respiratory:  Positive for shortness of breath.   Musculoskeletal:  Positive for myalgias.       Calf pain and swelling on the right.      Physical Exam Triage Vital Signs ED Triage Vitals  Encounter Vitals Group     BP      Girls Systolic BP Percentile      Girls Diastolic BP Percentile      Boys Systolic BP Percentile      Boys Diastolic BP Percentile      Pulse      Resp      Temp      Temp src      SpO2      Weight      Height      Head Circumference      Peak Flow      Pain Score      Pain Loc      Pain Education      Exclude from  Growth Chart    No data found.  Updated Vital Signs BP 129/86 (BP Location: Left Arm)   Pulse 84   Temp 99.1 F (37.3 C) (Oral)   Resp 17   Ht 5' 3 (1.6 m)   Wt 194 lb 0.1 oz (88 kg)   LMP 02/08/2015   SpO2 92%   BMI 34.37 kg/m   Visual Acuity Right Eye Distance:   Left Eye Distance:   Bilateral Distance:    Right Eye Near:   Left Eye Near:    Bilateral Near:     Physical Exam Vitals and nursing note reviewed.  Constitutional:      Appearance: Normal appearance. She is not ill-appearing.  HENT:     Head: Normocephalic and atraumatic.  Cardiovascular:     Rate and Rhythm: Normal rate and regular rhythm.     Pulses: Normal pulses.     Heart sounds: Normal heart sounds. No murmur heard.    No friction rub. No gallop.  Pulmonary:     Effort: Pulmonary effort is normal.     Breath sounds: Wheezing present. No rhonchi or rales.     Comments: Decreased breath sounds diffusely. Musculoskeletal:     Right lower leg: Edema present.     Comments: Marked tenderness of the right calf with a positive T[place]a' sign on the right.  Neurological:     Mental Status: She is alert.      UC Treatments / Results  Labs (all labs ordered are listed, but only abnormal results are displayed) Labs Reviewed - No data to display  EKG   Radiology No results found.  Procedures Procedures (including critical care time)  Medications Ordered in UC Medications - No data to display  Initial Impression / Assessment and Plan / UC Course  I have reviewed the triage  vital signs and the nursing notes.  Pertinent labs & imaging results that were available during my care of the patient were reviewed by me and considered in my medical decision making (see chart for details).   Patient is a pleasant 63 year old female presenting for evaluation of pain and swelling to the right calf has been going on the last 3 days with associated shortness of breath and increased oxygen demand over the same length of time.  She is a smoker but she is not on any hormone replacement therapy and she denies any recent long car rides or air travel.  She is still smoking.  She is currently on oxygen at 2 L by nasal cannula with a room air sat of 92%.  She reports that typically she will only need to wear the oxygen at night or if it is hot outside.  Over the last 3 days it had to be constant.  She is not experiencing any chest pain.  On exam patient's right calf is swollen and markedly tender.  Her right calf measures 41 cm compared to 39 cm of the left calf.  She also has a positive Homans' sign on the right.  DP and PT pulses are 2+.  Given patient's increased oxygen demand calf tenderness and concerned she may have a DVT and possible PE.  I do feel she is to be evaluated in the emergency department she has elected to go to [facility].   Final Clinical Impressions(s) / UC Diagnoses   Final diagnoses:  Shortness of breath  Right calf pain     Discharge Instructions      Please go to the emergency  department [facility] to be evaluated for your shortness of breath, increased oxygen demand, and right calf pain.  I am concerned you have a blood clot in the right calf and possibly one in your lung as well.  Please go now.     ED Prescriptions   None    PDMP not reviewed this encounter.   Bernardino Ditch, NP 08/25/24 1521

## 2024-08-25 NOTE — ED Notes (Signed)
 Ultrasound at bedside. Delay in blood work.

## 2024-08-25 NOTE — ED Notes (Signed)
 Pt ambulated around department with 2L Colon on. Pt O2 sat noted to decrease to 89%. Pt c/o SOB while ambulating. Provider made aware.   Pt given grape juice.

## 2024-08-25 NOTE — H&P (Signed)
 History and Physical    Patient: Gloria Jones FMW:[id number] DOB: 12/02/1961 DOA: 08/25/2024 DOS: the patient was seen and examined on 08/25/2024 PCP: [facility]  Patient coming from: Home  Chief Complaint:  Chief Complaint  Patient presents with   Shortness of Breath   HPI: Gloria Jones is a 63 y.o. female with medical history significant of COPD, asthma, essential hypertension, prediabetes, chronic respiratory failure on 2 L of oxygen, history of kidney stones, who presented to the urgent care center initially with complaint of pain and a knot at the back of her right calf.  Pain was radiating down into her right lower leg.  Symptoms started 3 days ago.  Patient was also having increasing shortness of breath.  She normally uses oxygen as needed at 2 L/min but now is using oxygen around-the-clock.  There is associated cough, PND and some orthopnea.  Patient was seen and evaluated.  Suspicion was.  For possible DVT and PE so patient was sent to the emergency room for further evaluation.  In the ED patient was evaluated with a right lower extremity Doppler ultrasound showed Baker's cyst but no DVT.  CT angiogram shows no PE but it shows infiltrate concerning for pneumonia as well as interstitial edema.  Patient at this point is suspected to have had acute CHF with superimposed early pneumonia.  Patient being admitted for further evaluation and treatment  Review of Systems: As mentioned in the history of present illness. All other systems reviewed and are negative. Past Medical History:  Diagnosis Date   Allergic state    Asthma    Bronchitis    COPD (chronic obstructive pulmonary disease) (HCC)    History of kidney stones    Hypertension    Pre-diabetes    Pulmonary nodules    Respiratory failure (HCC)    chronic o2 2liters   Past Surgical History:  Procedure Laterality Date   COLONOSCOPY WITH PROPOFOL  N/A 06/28/2017   Procedure: COLONOSCOPY WITH PROPOFOL ;  Surgeon:  Gaylyn Gladis PENNER, MD;  Location: [facility] ENDOSCOPY;  Service: Endoscopy;  Laterality: N/A;   COLONOSCOPY WITH PROPOFOL  N/A 03/25/2023   Procedure: COLONOSCOPY WITH PROPOFOL ;  Surgeon: Maryruth Ole DASEN, MD;  Location: [facility] ENDOSCOPY;  Service: Endoscopy;  Laterality: N/A;  DM   COLONOSCOPY WITH PROPOFOL  N/A 07/11/2023   Procedure: COLONOSCOPY WITH PROPOFOL ;  Surgeon: Maryruth Ole DASEN, MD;  Location: [facility] ENDOSCOPY;  Service: Endoscopy;  Laterality: N/A;   CYSTOSCOPY/URETEROSCOPY/HOLMIUM LASER/STENT PLACEMENT Right 07/30/2022   Procedure: CYSTOSCOPY/URETEROSCOPY/HOLMIUM LASER/STENT PLACEMENT;  Surgeon: Francisca Redell BROCKS, MD;  Location: [facility] ORS;  Service: Urology;  Laterality: Right;   ESOPHAGOGASTRODUODENOSCOPY (EGD) WITH PROPOFOL  N/A 06/28/2017   Procedure: ESOPHAGOGASTRODUODENOSCOPY (EGD) WITH PROPOFOL ;  Surgeon: Gaylyn Gladis PENNER, MD;  Location: [facility] ENDOSCOPY;  Service: Endoscopy;  Laterality: N/A;   KNEE SURGERY Left    meniscus repair   OVARY SURGERY Left 12/1977   partial ovary and fallopian tube removed   POLYPECTOMY  07/11/2023   Procedure: POLYPECTOMY;  Surgeon: Maryruth Ole DASEN, MD;  Location: [facility] ENDOSCOPY;  Service: Endoscopy;;   Social History:  reports that she has been smoking cigarettes. She has been exposed to tobacco smoke. She has never used smokeless tobacco. She reports that she does not currently use alcohol. She reports current drug use. Drug: Marijuana.  Allergies  Allergen Reactions   Simvastatin     Other reaction(s): Muscle Pain   Statins     Other reaction(s): Other (See Comments) arthralgia   Fish Allergy  Lisinopril     Other reaction(s): Cough   Losartan     Other reaction(s): Headache   Pravastatin Other (See Comments)    Family History  Problem Relation Age of Onset   Hypertension Mother    Colon cancer Maternal Uncle    Breast cancer Maternal Aunt        mat great aunt    Prior to Admission medications   Medication Sig Start Date End  Date Taking? Authorizing Provider  albuterol  (PROVENTIL  HFA;VENTOLIN  HFA) 108 (90 BASE) MCG/ACT inhaler Inhale 1-2 puffs into the lungs every 4 (four) hours as needed for wheezing or shortness of breath. 05/24/15   Betancourt, Ellouise LABOR, NP  albuterol  (PROVENTIL ) (2.5 MG/3ML) 0.083% nebulizer solution Take 3 mLs (2.5 mg total) by nebulization every 4 (four) hours as needed for wheezing or shortness of breath. 04/26/23 07/13/24  Angelena Smalls, MD  aspirin  EC 81 MG tablet Take 81 mg by mouth daily. Swallow whole.    [provider]  Cyanocobalamin  (VITAMIN B12) 1000 MCG TBCR Take by mouth.    [provider]  DULoxetine  (CYMBALTA ) 30 MG capsule Take 30 mg by mouth 2 (two) times daily.    [provider]  ezetimibe  (ZETIA ) 10 MG tablet Take 1 tablet by mouth daily. 07/06/22 07/06/23  [provider]  Fluticasone -Umeclidin-Vilant (TRELEGY ELLIPTA ) 100-62.5-25 MCG/ACT AEPB Take as directed - one dose every 24 hours for COPD. 04/26/23   Angelena Smalls, MD  ipratropium-albuterol  (DUONEB) 0.5-2.5 (3) MG/3ML SOLN Take 3 mLs by nebulization every 6 (six) hours as needed. 12/19/22   [place], Camellia PARAS, DO  methocarbamol  (ROBAXIN ) 500 MG tablet Take 1 tablet (500 mg total) by mouth at bedtime as needed for muscle spasms. 07/13/24   Brimage, Vondra, DO  oxyCODONE -acetaminophen  (PERCOCET) 5-325 MG tablet Take 1 tablet by mouth every 4 (four) hours as needed for severe pain. 09/27/23 09/26/24  Fisher, Devere ORN, PA-C  spironolactone  (ALDACTONE ) 25 MG tablet Take by mouth. 12/27/18   [provider]  traZODone (DESYREL) 50 MG tablet Take by mouth. 08/16/22   [provider]    Physical Exam: Vitals:   08/25/24 1715 08/25/24 1730 08/25/24 1830 08/25/24 2026  BP:   (!) 149/84   Pulse: 75 77 77 75  Resp: (!) 22 15 17 16   Temp:    98.5 F (36.9 C)  TempSrc:      SpO2: 93% 93% 94% 92%  Weight:      Height:       Constitutional: Chronically ill looking, NAD, calm,  comfortable Eyes: PERRL, lids and conjunctivae normal ENMT: Mucous membranes are moist. Posterior pharynx clear of any exudate or lesions.Normal dentition.  Neck: normal, supple, no masses, no thyromegaly Respiratory: Coarse breath sound bilaterally, no significant expiratory wheezing, diffuse basal crackles, normal respiratory effort. No accessory muscle use.  Cardiovascular: Regular rate and rhythm, no murmurs / rubs / gallops. No extremity edema. 2+ pedal pulses. No carotid bruits.  Abdomen: no tenderness, no masses palpated. No hepatosplenomegaly. Bowel sounds positive.  Musculoskeletal: Good range of motion, right calf tenderness and fullness Skin: no rashes, lesions, ulcers. No induration Neurologic: CN 2-12 grossly intact. Sensation intact, DTR normal. Strength 5/5 in all 4.  Psychiatric: Normal judgment and insight. Alert and oriented x 3. Normal mood  Data Reviewed:  Temperature 99.1, blood pressure 173/97, pulse 58 respiratory 22 oxygen sat 85% on room air 92% on 2 L.  White count 9.3 hemoglobin 18.3 platelets 192.  CO2 34.  Acute viral  screen is negative for flu RSV and COVID-19.  Venous Doppler ultrasound the lower extremity showed complex right Baker's cyst CT angiogram of the chest shows no PE.  There is mild diffuse interlobular septal thickening favoring interstitial edema.  Also airspace opacity in the anterior left lower lobe possibly atelectasis versus pneumonia.  Assessment and Plan:  #1 acute on chronic hypoxic respiratory failure: Secondary to CHF and pneumonia most likely.  Patient will be admitted.  Workup for CHF.  Initiated IV Rocephin  and Zithromax .  Blood cultures.  Continue oxygen titrate down to her home regimen.  #2 new onset CHF: Will get echocardiogram.  Initiate diuretics.  Monitor patient closely.  #3 community-acquired pneumonia: Initiate IV Rocephin  and Zithromax  as above.  Follow cultures  #4 right Baker's cyst: Complex cyst.  Supportive care only.   Consider orthopedics referral if it gets will most know if any sign of infection.  #5 COPD: No obvious exacerbation.  Continue to monitor  #6 history of tobacco use: Counseling provided.  #7 prediabetes: Dietary counseling    Advance Care Planning:   Code Status: Prior full code  Consults: None  Family Communication: No family at bedside  Severity of Illness: The appropriate patient status for this patient is INPATIENT. Inpatient status is judged to be reasonable and necessary in order to provide the required intensity of service to ensure the patient's safety. The patient's presenting symptoms, physical exam findings, and initial radiographic and laboratory data in the context of their chronic comorbidities is felt to place them at high risk for further clinical deterioration. Furthermore, it is not anticipated that the patient will be medically stable for discharge from the hospital within 2 midnights of admission.   * I certify that at the point of admission it is my clinical judgment that the patient will require inpatient hospital care spanning beyond 2 midnights from the point of admission due to high intensity of service, high risk for further deterioration and high frequency of surveillance required.*  AuthorBETHA SIM KNOLL, MD 08/25/2024 8:55 PM  For on call review [email or web address].

## 2024-08-25 NOTE — ED Triage Notes (Addendum)
 Patient states that she has a knot on the back of her right knee and has been having pain that radiates down the back of her right lower leg for 3 days.  Patient denies any fall or injury.  Patient is on her own oxygen tank.  Patient states that she usually has it on 2-3 liters.

## 2024-08-25 NOTE — ED Triage Notes (Signed)
 Pt to ED via POV from UC. Pt reports knot on the right posterior leg. Pt also reports increased SOB over the last 3 days. Pt normally wears 2-3L with sats around 88-90%. Pt on 2L sats 85% oxygen increased to 3L with sats 90%. No hx of blood clots. No blood thinners.

## 2024-08-25 NOTE — ED Notes (Signed)
 CCMD contacted and pt placed on monitor.

## 2024-08-26 ENCOUNTER — Inpatient Hospital Stay (HOSPITAL_COMMUNITY)
Admit: 2024-08-26 | Discharge: 2024-08-26 | Disposition: A | Discharge status: Home / Self Care | Attending: Internal Medicine | Admitting: Internal Medicine

## 2024-08-26 DIAGNOSIS — J9601 Acute respiratory failure with hypoxia: Secondary | ICD-10-CM | POA: Diagnosis not present

## 2024-08-26 DIAGNOSIS — I5030 Unspecified diastolic (congestive) heart failure: Secondary | ICD-10-CM

## 2024-08-26 LAB — ECHOCARDIOGRAM COMPLETE
Area-P 1/2: 2.63 cm2
Height: 63 in
S' Lateral: 2.6 cm
Weight: 2793.67 [oz_av]

## 2024-08-26 LAB — BASIC METABOLIC PANEL WITH GFR
Anion gap: 11 (ref 5–15)
BUN: 10 mg/dL (ref 8–23)
CO2: 35 mmol/L — ABNORMAL HIGH (ref 22–32)
Calcium: 8.9 mg/dL (ref 8.9–10.3)
Chloride: 96 mmol/L — ABNORMAL LOW (ref 98–111)
Creatinine, Ser: 0.63 mg/dL (ref 0.44–1.00)
GFR, Estimated: >60 mL/min (ref >60)
Glucose, Bld: 92 mg/dL (ref 70–99)
Potassium: 3.9 mmol/L (ref 3.5–5.1)
Sodium: 142 mmol/L (ref 135–145)

## 2024-08-26 LAB — HIV ANTIBODY (ROUTINE TESTING W REFLEX): HIV Screen 4th Generation wRfx: NONREACTIVE

## 2024-08-26 LAB — VITAMIN B12: Vitamin B-12: 281 pg/mL (ref 180–914)

## 2024-08-26 LAB — PHOSPHORUS: Phosphorus: 3.7 mg/dL (ref 2.5–4.6)

## 2024-08-26 LAB — MAGNESIUM: Magnesium: 1.8 mg/dL (ref 1.7–2.4)

## 2024-08-26 LAB — VITAMIN D 25 HYDROXY (VIT D DEFICIENCY, FRACTURES): Vit D, 25-Hydroxy: 28.52 ng/mL — ABNORMAL LOW (ref 30–100)

## 2024-08-26 MED ORDER — AMLODIPINE BESYLATE 5 MG PO TABS
5.0000 mg | ORAL_TABLET | Freq: Every day | ORAL | Status: DC
  Administered 2024-08-26 – 2024-08-27 (×2): 5 mg via ORAL
  Filled 2024-08-26 (×2): qty 1

## 2024-08-26 MED ORDER — SPIRONOLACTONE 25 MG PO TABS
25.0000 mg | ORAL_TABLET | Freq: Every day | ORAL | Status: DC
  Administered 2024-08-26 – 2024-08-27 (×2): 25 mg via ORAL
  Filled 2024-08-26 (×2): qty 1

## 2024-08-26 MED ORDER — CYANOCOBALAMIN 1000 MCG/ML IJ SOLN
1000.0000 ug | Freq: Every day | INTRAMUSCULAR | Status: DC
  Administered 2024-08-26: 1000 ug via INTRAMUSCULAR
  Filled 2024-08-26 (×2): qty 1

## 2024-08-26 MED ORDER — VITAMIN B-12 1000 MCG PO TABS: 1000.0000 ug | ORAL_TABLET | Freq: Every day | ORAL | Status: DC

## 2024-08-26 MED ORDER — BUDESON-GLYCOPYRROL-FORMOTEROL 160-9-4.8 MCG/ACT IN AERO
2.0000 | INHALATION_SPRAY | Freq: Two times a day (BID) | RESPIRATORY_TRACT | Status: DC
  Administered 2024-08-26 – 2024-08-27 (×3): 2 via RESPIRATORY_TRACT
  Filled 2024-08-26: qty 5.9

## 2024-08-26 MED ORDER — ASPIRIN 81 MG PO TBEC
81.0000 mg | DELAYED_RELEASE_TABLET | Freq: Every day | ORAL | Status: DC
  Administered 2024-08-26 – 2024-08-27 (×2): 81 mg via ORAL
  Filled 2024-08-26 (×2): qty 1

## 2024-08-26 MED ORDER — METHOCARBAMOL 500 MG PO TABS
500.0000 mg | ORAL_TABLET | Freq: Every evening | ORAL | Status: DC | PRN
  Administered 2024-08-26: 500 mg via ORAL
  Filled 2024-08-26: qty 1

## 2024-08-26 MED ORDER — PREDNISONE 20 MG PO TABS
40.0000 mg | ORAL_TABLET | Freq: Every day | ORAL | Status: DC
  Administered 2024-08-26 – 2024-08-27 (×2): 40 mg via ORAL
  Filled 2024-08-26 (×2): qty 2

## 2024-08-26 MED ORDER — PANTOPRAZOLE SODIUM 40 MG PO TBEC
40.0000 mg | DELAYED_RELEASE_TABLET | Freq: Every day | ORAL | Status: DC
  Administered 2024-08-26 – 2024-08-27 (×2): 40 mg via ORAL
  Filled 2024-08-26 (×2): qty 1

## 2024-08-26 MED ORDER — DULOXETINE HCL 30 MG PO CPEP
60.0000 mg | ORAL_CAPSULE | Freq: Every morning | ORAL | Status: DC
Start: 2024-08-26 — End: 2024-08-27
  Administered 2024-08-26 – 2024-08-27 (×2): 60 mg via ORAL
  Filled 2024-08-26 (×2): qty 2

## 2024-08-26 MED ORDER — IPRATROPIUM-ALBUTEROL 0.5-2.5 (3) MG/3ML IN SOLN: 3.0000 mL | Freq: Four times a day (QID) | RESPIRATORY_TRACT | Status: DC | PRN

## 2024-08-26 MED ORDER — ORAL CARE MOUTH RINSE: 15.0000 mL | OROMUCOSAL | Status: DC | PRN

## 2024-08-26 MED ORDER — VITAMIN D (ERGOCALCIFEROL) 1.25 MG (50000 UNIT) PO CAPS
50000.0000 [IU] | ORAL_CAPSULE | ORAL | Status: DC
  Administered 2024-08-26: 50000 [IU] via ORAL
  Filled 2024-08-26: qty 1

## 2024-08-26 NOTE — Progress Notes (Signed)
 [facility] Progress Note  Patient: Gloria Jones    FMW:[id number]  DOA: 08/25/2024     Date of Service: the patient was seen and examined on 08/26/2024  Chief Complaint  Patient presents with   Shortness of Breath   Brief hospital course: Tavonna Worthington is a 63 y.o. female with medical history significant of COPD, asthma, essential hypertension, prediabetes, chronic respiratory failure on 2 L of oxygen, history of kidney stones, who presented to the urgent care center initially with complaint of pain and a knot at the back of her right calf.  Pain was radiating down into her right lower leg.  Symptoms started 3 days ago.  Patient was also having increasing shortness of breath.  She normally uses oxygen as needed at 2 L/min but now is using oxygen around-the-clock.  There is associated cough, PND and some orthopnea.  Patient was seen and evaluated.  Suspicion was.  For possible DVT and PE so patient was sent to the emergency room for further evaluation.  In the ED patient was evaluated with a right lower extremity Doppler ultrasound showed Baker's cyst but no DVT.  CT angiogram shows no PE but it shows infiltrate concerning for pneumonia as well as interstitial edema.  Patient at this point is suspected to have had acute CHF with superimposed early pneumonia.  Patient being admitted for further evaluation and treatment    Assessment and Plan:   # Acute on chronic hypoxic respiratory failure: Secondary to COPD exacerbation, pneumonia and mild dCHF Continue IV Abx as below Continue oxygen titrate down to her home regimen.  # community-acquired pneumonia: Initiate IV Rocephin  and Zithromax  as above.  Follow cultures   # Diastolic CHF, possible mild exacerbation  BNP within normal  TTE LVEF 70 to 75%, grade 1 DD  S/p IV Lasix , currently euvolemic.   Resumed home dose of Aldactone  25 mg p.o. daily Monitor patient closely.   # Hypertension, blood pressure Started amlodipine  5 mg p.o.  daily Monitor BP and titrate medications accordingly   # right Baker's cyst: Complex cyst.  Supportive care only.  Consider orthopedics referral if it gets will most know if any sign of infection.   # COPD exacerbation secondary to pneumonia Continue Breztri  inhaler, DuoNeb every 6 hourly as needed continue to monitor Started prednisone  40 mg p.o. daily x 3 days PPI for GI prophylax  # history of tobacco use: Counseling provided.   # prediabetes: Dietary counseling    # Pulmonary nodule, incidental finding CTA chest: 6. 3 mm subpleural nodule in the left upper lobe.  Repeat CT in 12 months.  Follow-up with PCP.  # Vitamin D  insufficiency, started vitamin D  50,000 units weekly.  Follow with PCP to repeat vitamin D  level after 3 to 6 months.  # Vitamin B12 at 181, goal >400.  Started vitamin B12 1000 mcg IM injection during hospital stay followed by oral supplement.  Follow with PCP to repeat vitamin B12 level after 3 to 6 months.   Body mass index is 30.93 kg/m.  Interventions:  Diet: Heart healthy diet DVT Prophylaxis: Subcutaneous Lovenox    Advance goals of care discussion: Full code  Family Communication: family was not present at bedside, at the time of interview.  The pt provided permission to discuss medical plan with the family. Opportunity was given to ask question and all questions were answered satisfactorily.   Disposition:  Pt is from Home, admitted with Resp Failure, PNA and COPD, still has SOB, which precludes a  safe discharge. Discharge to home, when stable, most likely in 1 to 2 days.  Subjective: No significant events overnight.  Patient is feeling improvement in the shortness of breath, still has mild shortness of breath, feeling improvement.  Blood pressure remained high, patient stated that her blood pressure remains normal at home, she checks weekly.  Denied any other complaints.  Physical Exam: General: NAD, lying comfortably Appear in no distress,  affect appropriate Eyes: PERRLA ENT: Oral Mucosa Clear, moist  Neck: no JVD,  Cardiovascular: S1 and S2 Present, no Murmur,  Respiratory: equal air entry bilaterally , mild crackles in minimal wheezes.   Abdomen: Bowel Sound present, Soft and no tenderness,  Skin: no rashes Extremities: no Pedal edema, no calf tenderness Neurologic: without any new focal findings Gait not checked due to patient safety concerns  Vitals:   08/25/24 2156 08/26/24 0500 08/26/24 0506 08/26/24 0824  BP: (!) 157/98  (!) 157/87 (!) 176/98  Pulse: 69  70 66  Resp: 18  18 18   Temp: 98.3 F (36.8 C)  98.2 F (36.8 C) 98.4 F (36.9 C)  TempSrc:    Oral  SpO2: 92%  92% 95%  Weight:  79.2 kg    Height:        Intake/Output Summary (Last 24 hours) at 08/26/2024 0901 Last data filed at 08/26/2024 0328 Gross per 24 hour  Intake 264.86 ml  Output 1000 ml  Net -735.14 ml   Filed Weights   08/25/24 1602 08/25/24 2054 08/26/24 0500  Weight: 88 kg 79 kg 79.2 kg    Data Reviewed: I have personally reviewed and interpreted daily labs, tele strips, imagings as discussed above. I reviewed all nursing notes, pharmacy notes, vitals, pertinent old records I have discussed plan of care as described above with RN and patient/family.  CBC: Recent Labs  Lab 08/25/24 1807 08/25/24 2205  WBC 9.7 9.3  HGB 17.2* 18.3*  HCT 55.9* 57.5*  MCV 101.1* 98.3  PLT 169 192   Basic Metabolic Panel: Recent Labs  Lab 08/25/24 1807 08/25/24 2205 08/26/24 0328  NA 140  --  142  K 4.1  --  3.9  CL 98  --  96*  CO2 34*  --  35*  GLUCOSE 107*  --  92  BUN 12  --  10  CREATININE 0.71 0.57 0.63  CALCIUM 8.7*  --  8.9    Studies: CT Angio Chest PE W/Cm &/Or Wo Cm Result Date: 08/25/2024 EXAM: CTA of the Chest with contrast for PE 08/25/2024 07:24:48 PM TECHNIQUE: CTA of the chest was performed after the administration of intravenous contrast. Multiplanar reformatted images are provided for review. MIP images are provided  for review. Automated exposure control, iterative reconstruction, and/or weight based adjustment of the mA/kV was utilized to reduce the radiation dose to as low as reasonably achievable. COMPARISON: 06/29/2023 CLINICAL HISTORY: Pulmonary embolism (PE) suspected, high prob. Table formatting from the original note was not included. Images from the original note were not included. Pt to ED via POV from [facility]. Pt reports knot on the right posterior leg. Pt also reports increased SOB over the last 3 days. Pt normally wears 2-3L with sats around 88-90%. Pt on 2L sats 85% oxygen increased to 3L with sats 90%. No hx of blood clots. No blood thinners. FINDINGS: PULMONARY ARTERIES: Pulmonary arteries are adequately opacified for evaluation. Negative for pulmonary embolism. The main pulmonary artery is dilated, measuring 38 mm in diameter. This can be seen with pulmonary arterial  hypertension. MEDIASTINUM: The heart and pericardium demonstrate no acute abnormality. There is a trace pericardial effusion. Normal caliber thoracic aorta. No aortic dissection. LYMPH NODES: No mediastinal, hilar or axillary lymphadenopathy. LUNGS AND PLEURA: Centrilobular and paraseptal emphysema with upper lung predominance. Airspace opacity in the anterior left lower lobe is presumably atelectasis, though pneumonia is not excluded. There is mild diffuse interlobular septal thickening. No pleural effusion or pneumothorax. 3 mm subpleural nodule in the left upper lobe on series 5 image 32. This is not visualized on 06/29/23. UPPER ABDOMEN: Reflux and contrast into the IVC and hepatic veins compatible with elevated right heart pressures. Upper abdomen otherwise unremarkable. SOFT TISSUES AND BONES: No acute bone or soft tissue abnormality. IMPRESSION: 1. No pulmonary embolism. 2. Mild diffuse interlobular septal thickening favoring interstitial edema . 3. Airspace opacity in the anterior left lower lobe, possibly atelectasis, though pneumonia is not  excluded. 4. Dilated main pulmonary artery, measuring 38 mm in diameter, consistent with pulmonary arterial hypertension. 5. Centrilobular and paraseptal emphysema with upper lung predominance. 6. 3 mm subpleural nodule in the left upper lobe, not visualized on prior study. According to the Fleischner Society pulmonary nodule recommendations, the finding is consistent with a single solid nodule <6 mm in a high-risk patient, and the recommendation is optional CT at 12 months (particularly with suspicious nodule morphology and/or upper lobe location). 7. Elevated right heart pressures. Electronically signed by: Norman Gatlin MD 08/25/2024 07:38 PM EDT RP Workstation: [id number]    Scheduled Meds:  enoxaparin  (LOVENOX ) injection  40 mg Subcutaneous Q24H   furosemide   40 mg Intravenous BID   sodium chloride  flush  3 mL Intravenous Q12H   Continuous Infusions:  sodium chloride  5 mL/hr at 08/26/24 0328   azithromycin      cefTRIAXone  (ROCEPHIN )  IV     PRN Meds: sodium chloride , acetaminophen , ondansetron  (ZOFRAN ) IV, sodium chloride  flush  Time spent: 55 minutes  Author: ELVAN SOR. MD [facility] 08/26/2024 9:01 AM  To reach On-call, see care teams to locate the attending and reach out to them via [email or web address]. If 7PM-7AM, please contact night-coverage If you still have difficulty reaching the attending provider, please page the [facility] (Director on Call) for [facility] on amion for assistance.

## 2024-08-26 NOTE — Plan of Care (Signed)

## 2024-08-26 NOTE — Plan of Care (Signed)
°  Problem: Clinical Measurements: °Goal: Respiratory complications will improve °Outcome: Progressing °  °Problem: Clinical Measurements: °Goal: Cardiovascular complication will be avoided °Outcome: Progressing °  °Problem: Activity: °Goal: Risk for activity intolerance will decrease °Outcome: Progressing °  °

## 2024-08-27 ENCOUNTER — Other Ambulatory Visit: Payer: Self-pay

## 2024-08-27 DIAGNOSIS — J9601 Acute respiratory failure with hypoxia: Secondary | ICD-10-CM | POA: Diagnosis not present

## 2024-08-27 LAB — CBC
HCT: 61.7 % — ABNORMAL HIGH (ref 36.0–46.0)
Hemoglobin: 19.1 g/dL — ABNORMAL HIGH (ref 12.0–15.0)
MCH: 30.4 pg (ref 26.0–34.0)
MCHC: 31 g/dL (ref 30.0–36.0)
MCV: 98.1 fL (ref 80.0–100.0)
Platelets: 189 K/uL (ref 150–400)
RBC: 6.29 MIL/uL — ABNORMAL HIGH (ref 3.87–5.11)
RDW: 12.6 % (ref 11.5–15.5)
WBC: 8.4 K/uL (ref 4.0–10.5)
nRBC: 0 % (ref 0.0–0.2)

## 2024-08-27 LAB — BASIC METABOLIC PANEL WITH GFR
Anion gap: 15 (ref 5–15)
BUN: 18 mg/dL (ref 8–23)
CO2: 33 mmol/L — ABNORMAL HIGH (ref 22–32)
Calcium: 9.4 mg/dL (ref 8.9–10.3)
Chloride: 93 mmol/L — ABNORMAL LOW (ref 98–111)
Creatinine, Ser: 0.62 mg/dL (ref 0.44–1.00)
GFR, Estimated: >60 mL/min (ref >60)
Glucose, Bld: 140 mg/dL — ABNORMAL HIGH (ref 70–99)
Potassium: 4.3 mmol/L (ref 3.5–5.1)
Sodium: 141 mmol/L (ref 135–145)

## 2024-08-27 LAB — MAGNESIUM: Magnesium: 2.1 mg/dL (ref 1.7–2.4)

## 2024-08-27 LAB — PHOSPHORUS: Phosphorus: 4 mg/dL (ref 2.5–4.6)

## 2024-08-27 MED ORDER — AMLODIPINE BESYLATE 5 MG PO TABS
5.0000 mg | ORAL_TABLET | Freq: Every day | ORAL | 3 refills | Status: DC
  Filled 2024-08-27: qty 90, 90d supply, fill #0

## 2024-08-27 MED ORDER — AZITHROMYCIN 500 MG PO TABS
500.0000 mg | ORAL_TABLET | Freq: Every day | ORAL | 0 refills | Status: AC
Start: 2024-08-27 — End: 2024-08-29
  Filled 2024-08-27: qty 2, 2d supply, fill #0

## 2024-08-27 MED ORDER — VITAMIN D (ERGOCALCIFEROL) 1.25 MG (50000 UNIT) PO CAPS
50000.0000 [IU] | ORAL_CAPSULE | ORAL | 0 refills | Status: AC
  Filled 2024-08-27: qty 12, 84d supply, fill #0

## 2024-08-27 MED ORDER — DEXTROMETHORPHAN-GUAIFENESIN 20-200 MG/20ML PO LIQD
10.0000 mL | Freq: Four times a day (QID) | ORAL | 0 refills | Status: DC | PRN
  Filled 2024-08-27: qty 118, 1d supply, fill #0

## 2024-08-27 MED ORDER — PANTOPRAZOLE SODIUM 40 MG PO TBEC
40.0000 mg | DELAYED_RELEASE_TABLET | Freq: Every day | ORAL | 0 refills | Status: DC
  Filled 2024-08-27: qty 10, 10d supply, fill #0

## 2024-08-27 MED ORDER — CYANOCOBALAMIN 1000 MCG PO TABS
1000.0000 ug | ORAL_TABLET | Freq: Every day | ORAL | 2 refills | Status: AC
  Filled 2024-08-27: qty 30, 30d supply, fill #0

## 2024-08-27 MED ORDER — TRELEGY ELLIPTA 100-62.5-25 MCG/ACT IN AEPB: 1.0000 | INHALATION_SPRAY | Freq: Every day | RESPIRATORY_TRACT | 1 refills | Status: AC

## 2024-08-27 MED ORDER — PREDNISONE 20 MG PO TABS
40.0000 mg | ORAL_TABLET | Freq: Every day | ORAL | 0 refills | Status: AC
  Filled 2024-08-27: qty 10, 5d supply, fill #0

## 2024-08-27 MED ORDER — AMOXICILLIN-POT CLAVULANATE 875-125 MG PO TABS
1.0000 | ORAL_TABLET | Freq: Two times a day (BID) | ORAL | 0 refills | Status: AC
  Filled 2024-08-27: qty 6, 3d supply, fill #0

## 2024-08-27 NOTE — Discharge Summary (Signed)
 [facility] Discharge Summary   Patient: Gloria Jones FMW:[id number]  PCP: [facility]  Date of admission: 08/25/2024   Date of discharge:  08/27/2024     Discharge Diagnoses:  Principal Problem:   Acute hypoxemic respiratory failure (HCC) Active Problems:   COPD with acute exacerbation (HCC)   Acute respiratory failure with hypoxia (HCC)   Essential (primary) hypertension   Admitted From: Home Disposition:  Home   Recommendations for Outpatient Follow-up:  F/u with PCP in 1 wk Follow up LABS/TEST:  CXR in weeks B12 and Vit D level in 3-6 months   Follow-up Information     [place], [facility] Follow up.   Why: hospital follow up Contact information: [address] [place] [address] [phone]                Diet recommendation: Cardiac diet  Activity: The patient is advised to gradually reintroduce usual activities, as tolerated  Discharge Condition: stable  Code Status: Full code   History of present illness: As per the H and P dictated on admission. Hospital Course:  Gloria Jones is a 63 y.o. female with medical history significant of COPD, asthma, essential hypertension, prediabetes, chronic respiratory failure on 2 L of oxygen, history of kidney stones, who presented to the urgent care center initially with complaint of pain and a knot at the back of her right calf.  Pain was radiating down into her right lower leg.  Symptoms started 3 days ago.  Patient was also having increasing shortness of breath.  She normally uses oxygen as needed at 2 L/min but now is using oxygen around-the-clock.  There is associated cough, PND and some orthopnea.  Patient was seen and evaluated.  Suspicion was.  For possible DVT and PE so patient was sent to the emergency room for further evaluation.  In the ED patient was evaluated with a right lower extremity Doppler ultrasound showed Baker's cyst but no DVT.  CT angiogram shows no PE but it shows  infiltrate concerning for pneumonia as well as interstitial edema.  Patient at this point is suspected to have had acute CHF with superimposed early pneumonia.  Patient being admitted for further evaluation and treatment      Assessment and Plan:  # Acute on chronic hypoxic respiratory failure: Secondary to COPD exacerbation, pneumonia and mild dCHF.  S/p IV Abx as below and supplemental O2 ambulation, gradually wean down to her baseline 2 L via nasal cannula.   # community-acquired pneumonia: s/p IV Rocephin  and Zithromax . Transition to oral antibiotics azithromycin  500 mg p.o. daily for 2 days and Augmentin  twice daily for 3 days. Follow-up with PCP to repeat chest x-ray after 4 weeks for resolution of pneumonia.   # Diastolic CHF, possible mild exacerbation  BNP within normal. TTE LVEF 70 to 75%, grade 1 DD  S/p IV Lasix , currently euvolemic.   Resumed home dose of Aldactone  25 mg p.o. daily   # Hypertension: Started amlodipine  5 mg p.o. daily.  Patient was advised to monitor BP at home and follow with PCP.  # right Baker's cyst: Complex cyst.  Supportive care only.   During hospital stay pain resolved possible secondary to steroids and antibiotics.  Patient was advised to follow with PCP and possible referral to orthopedic surgery if recurrent pain.   # COPD exacerbation secondary to pneumonia Continue Breztri  inhaler, s/p DuoNeb every 6 hourly prn S/p prednisone  40 mg p.o. daily, discharged on prednisone  40 mg p.o.  daily for 5 days. Continue pantoprazole  40 mg p.o. daily for 10 days for GI prophylax   # history of tobacco use: Counseling provided. # prediabetes: Continue diabetic diet.  Patient is aware that she will be at high risk for hyperglycemia secondary to steroids.  Follow-up with PCP.   # Pulmonary nodule, incidental finding CTA chest: 6. 3 mm subpleural nodule in the left upper lobe.  Repeat CT in 12 months.  Follow-up with PCP.   # Vitamin D  insufficiency, started  vitamin D  50,000 units weekly.  Follow with PCP to repeat vitamin D  level after 3 to 6 months.   # Vitamin B12 at 181, goal >400.  Started vitamin B12 1000 mcg IM injection during hospital stay followed by oral supplement.  Follow with PCP to repeat vitamin B12 level after 3 to 6 months.    Body mass index is 30.93 kg/m.  Nutrition Interventions:  - Patient was instructed, not to drive, operate heavy machinery, perform activities at heights, swimming or participation in water activities or provide baby sitting services while on Pain, Sleep and Anxiety Medications; until her outpatient Physician has advised to do so again.  - Also recommended to not to take more than prescribed Pain, Sleep and Anxiety Medications.  Patient was ambulatory without any assistance. On the day of the discharge the patient's vitals were stable, and no other acute medical condition were reported by patient. the patient was felt safe to be discharge at Home.  Consultants: None Procedures: None  Discharge Exam: General: Appear in no distress, no Rash; Oral Mucosa Clear, moist. Cardiovascular: S1 and S2 Present, no Murmur, Respiratory: normal respiratory effort, Bilateral Air entry present and no Crackles, no wheezes Abdomen: Bowel Sound present, Soft and no tenderness, no hernia Extremities: no Pedal edema, no calf tenderness Neurology: alert and oriented to time, place, and person affect appropriate.  Filed Weights   08/25/24 1602 08/25/24 2054 08/26/24 0500  Weight: 88 kg 79 kg 79.2 kg   Vitals:   08/27/24 0428 08/27/24 0743  BP: 132/80 125/76  Pulse: 73 79  Resp: 18 16  Temp: 98 F (36.7 C) 97.6 F (36.4 C)  SpO2: 96% 90%    DISCHARGE MEDICATION: Allergies as of 08/27/2024       Reactions   Simvastatin    Other reaction(s): Muscle Pain   Statins    Other reaction(s): Other (See Comments) arthralgia   Fish Allergy    Lisinopril    Other reaction(s): Cough   Losartan    Other reaction(s):  Headache   Pravastatin Other (See Comments)        Medication List     STOP taking these medications    oxyCODONE -acetaminophen  5-325 MG tablet Commonly known as: Percocet       TAKE these medications    albuterol  108 (90 Base) MCG/ACT inhaler Commonly known as: VENTOLIN  HFA Inhale 1-2 puffs into the lungs every 4 (four) hours as needed for wheezing or shortness of breath.   albuterol  (2.5 MG/3ML) 0.083% nebulizer solution Commonly known as: PROVENTIL  Take 3 mLs (2.5 mg total) by nebulization every 4 (four) hours as needed for wheezing or shortness of breath.   amLODipine  5 MG tablet Commonly known as: NORVASC  Take 1 tablet (5 mg total) by mouth daily. Start taking on: August 28, 2024   amoxicillin -clavulanate 875-125 MG tablet Commonly known as: AUGMENTIN  Take 1 tablet by mouth 2 (two) times daily for 3 days.   aspirin  EC 81 MG tablet Take 81 mg  by mouth daily. Swallow whole.   azithromycin  500 MG tablet Commonly known as: Zithromax  Take 1 tablet (500 mg total) by mouth daily for 2 days.   cyanocobalamin  1000 MCG tablet Take 1 tablet (1,000 mcg total) by mouth daily. Start taking on: August 29, 2024   DULoxetine  60 MG capsule Commonly known as: CYMBALTA  Take 60 mg by mouth every morning. What changed: Another medication with the same name was removed. Continue taking this medication, and follow the directions you see here.   guaiFENesin -dextromethorphan  100-10 MG/5ML syrup Commonly known as: ROBITUSSIN DM Take 10 mLs by mouth every 6 (six) hours as needed for cough.   ipratropium-albuterol  0.5-2.5 (3) MG/3ML Soln Commonly known as: DUONEB Take 3 mLs by nebulization every 6 (six) hours as needed.   methocarbamol  500 MG tablet Commonly known as: ROBAXIN  Take 1 tablet (500 mg total) by mouth at bedtime as needed for muscle spasms.   pantoprazole  40 MG tablet Commonly known as: PROTONIX  Take 1 tablet (40 mg total) by mouth daily for 10 days. Start  taking on: August 28, 2024   predniSONE  20 MG tablet Commonly known as: DELTASONE  Take 2 tablets (40 mg total) by mouth daily with breakfast for 5 days. Start taking on: August 28, 2024   spironolactone  25 MG tablet Commonly known as: ALDACTONE  Take 25 mg by mouth daily.   Trelegy Ellipta  100-62.5-25 MCG/ACT Aepb Generic drug: Fluticasone -Umeclidin-Vilant Inhale 1 puff into the lungs daily. Take as directed - one dose every 24 hours for COPD.   Vitamin D  (Ergocalciferol ) 1.25 MG (50000 UNIT) Caps capsule Commonly known as: DRISDOL  Take 1 capsule (50,000 Units total) by mouth every 7 (seven) days. Start taking on: September 02, 2024       Allergies  Allergen Reactions   Simvastatin     Other reaction(s): Muscle Pain   Statins     Other reaction(s): Other (See Comments) arthralgia   Fish Allergy    Lisinopril     Other reaction(s): Cough   Losartan     Other reaction(s): Headache   Pravastatin Other (See Comments)   Discharge Instructions     Call MD for:  difficulty breathing, headache or visual disturbances   Complete by: As directed    Call MD for:  extreme fatigue   Complete by: As directed    Call MD for:  persistant dizziness or light-headedness   Complete by: As directed    Call MD for:  persistant nausea and vomiting   Complete by: As directed    Call MD for:  severe uncontrolled pain   Complete by: As directed    Call MD for:  temperature >100.4   Complete by: As directed    Diet - low sodium heart healthy   Complete by: As directed    Discharge instructions   Complete by: As directed    F/u with PCP in 1 wk CXR in weeks B12 and Vit D level in 3-6 months   Increase activity slowly   Complete by: As directed        The results of significant diagnostics from this hospitalization (including imaging, microbiology, ancillary and laboratory) are listed below for reference.    Significant Diagnostic Studies: ECHOCARDIOGRAM COMPLETE Result Date:  08/26/2024    ECHOCARDIOGRAM REPORT   Patient Name:   ANAHIT KLUMB Date of Exam: 08/26/2024 Medical Rec #:  [id number]        Height:       63.0 in Accession #:  [id number]       Weight:       174.6 lb Date of Birth:  [birth year]        BSA:          1.825 m Patient Age:    62 years         BP:           176/98 mmHg Patient Gender: F                HR:           66 bpm. Exam Location:  [facility] Procedure: 2D Echo, Cardiac Doppler and Color Doppler (Both Spectral and Color            Flow Doppler were utilized during procedure). Indications:     CHF  History:         Patient has no prior history of Echocardiogram examinations.                  COPD; Risk Factors:Hypertension and Former Smoker.  Sonographer:     Philomena Daring Referring Phys:  [id number] EMERY LITTIE FUSS Diagnosing Phys: Annabella Scarce MD IMPRESSIONS  1. Mild intracavitary gradient. Peak velocity 1.38 m/s. Peak gradient 7.6 mmHg. Left ventricular ejection fraction, by estimation, is 70 to 75%. The left ventricle has hyperdynamic function. The left ventricle has no regional wall motion abnormalities. There is mild concentric left ventricular hypertrophy. Left ventricular diastolic parameters are consistent with Grade I diastolic dysfunction (impaired relaxation).  2. Right ventricular systolic function is normal. The right ventricular size is normal. There is normal pulmonary artery systolic pressure.  3. The mitral valve is normal in structure. No evidence of mitral valve regurgitation. No evidence of mitral stenosis.  4. The aortic valve is tricuspid. There is mild calcification of the aortic valve. There is mild thickening of the aortic valve. Aortic valve regurgitation is not visualized. No aortic stenosis is present.  5. The inferior vena cava is dilated in size with >50% respiratory variability, suggesting right atrial pressure of 8 mmHg. FINDINGS  Left Ventricle: Mild intracavitary gradient. Peak velocity 1.38 m/s. Peak gradient 7.6 mmHg. Left  ventricular ejection fraction, by estimation, is 70 to 75%. The left ventricle has hyperdynamic function. The left ventricle has no regional wall motion abnormalities. The left ventricular internal cavity size was normal in size. There is mild concentric left ventricular hypertrophy. Left ventricular diastolic parameters are consistent with Grade I diastolic dysfunction (impaired relaxation). Normal left  ventricular filling pressure. Right Ventricle: The right ventricular size is normal. No increase in right ventricular wall thickness. Right ventricular systolic function is normal. There is normal pulmonary artery systolic pressure. The tricuspid regurgitant velocity is 1.57 m/s, and  with an assumed right atrial pressure of 8 mmHg, the estimated right ventricular systolic pressure is 17.9 mmHg. Left Atrium: Left atrial size was normal in size. Right Atrium: Right atrial size was normal in size. Pericardium: There is no evidence of pericardial effusion. Mitral Valve: The mitral valve is normal in structure. No evidence of mitral valve regurgitation. No evidence of mitral valve stenosis. Tricuspid Valve: The tricuspid valve is normal in structure. Tricuspid valve regurgitation is trivial. No evidence of tricuspid stenosis. Aortic Valve: The aortic valve is tricuspid. There is mild calcification of the aortic valve. There is mild thickening of the aortic valve. Aortic valve regurgitation is not visualized. No aortic stenosis is present. Pulmonic Valve: The pulmonic valve was normal in structure. Pulmonic valve regurgitation is not  visualized. No evidence of pulmonic stenosis. Aorta: The aortic root is normal in size and structure. Venous: The inferior vena cava is dilated in size with greater than 50% respiratory variability, suggesting right atrial pressure of 8 mmHg. IAS/Shunts: No atrial level shunt detected by color flow Doppler.  LEFT VENTRICLE PLAX 2D LVIDd:         4.40 cm   Diastology LVIDs:         2.60 cm    LV e' medial:    6.74 cm/s LV PW:         1.20 cm   LV E/e' medial:  9.9 LV IVS:        1.30 cm   LV e' lateral:   5.87 cm/s LVOT diam:     1.80 cm   LV E/e' lateral: 11.4 LV SV:         61 LV SV Index:   34 LVOT Area:     2.54 cm  RIGHT VENTRICLE             IVC RV S prime:     10.60 cm/s  IVC diam: 2.00 cm TAPSE (M-mode): 2.4 cm LEFT ATRIUM             Index        RIGHT ATRIUM           Index LA diam:        3.70 cm 2.03 cm/m   RA Area:     17.20 cm LA Vol (A2C):   38.6 ml 21.15 ml/m  RA Volume:   43.80 ml  24.00 ml/m LA Vol (A4C):   46.8 ml 25.64 ml/m LA Biplane Vol: 42.5 ml 23.29 ml/m  AORTIC VALVE LVOT Vmax:   116.00 cm/s LVOT Vmean:  74.900 cm/s LVOT VTI:    0.241 m  AORTA Ao Root diam: 2.40 cm MITRAL VALVE                TRICUSPID VALVE MV Area (PHT): 2.63 cm     TR Peak grad:   9.9 mmHg MV Decel Time: 288 msec     TR Vmax:        157.00 cm/s MV E velocity: 66.90 cm/s MV A velocity: 130.00 cm/s  SHUNTS MV E/A ratio:  0.51         Systemic VTI:  0.24 m                             Systemic Diam: 1.80 cm Annabella Scarce MD Electronically signed by Annabella Scarce MD Signature Date/Time: 08/26/2024/10:16:41 AM    Final    CT Angio Chest PE W/Cm &/Or Wo Cm Result Date: 08/25/2024 EXAM: CTA of the Chest with contrast for PE 08/25/2024 07:24:48 PM TECHNIQUE: CTA of the chest was performed after the administration of intravenous contrast. Multiplanar reformatted images are provided for review. MIP images are provided for review. Automated exposure control, iterative reconstruction, and/or weight based adjustment of the mA/kV was utilized to reduce the radiation dose to as low as reasonably achievable. COMPARISON: 06/29/2023 CLINICAL HISTORY: Pulmonary embolism (PE) suspected, high prob. Table formatting from the original note was not included. Images from the original note were not included. Pt to ED via POV from [facility]. Pt reports knot on the right posterior leg. Pt also reports increased SOB over the last 3  days. Pt normally wears 2-3L with sats around 88-90%. Pt on 2L sats 85% oxygen  increased to 3L with sats 90%. No hx of blood clots. No blood thinners. FINDINGS: PULMONARY ARTERIES: Pulmonary arteries are adequately opacified for evaluation. Negative for pulmonary embolism. The main pulmonary artery is dilated, measuring 38 mm in diameter. This can be seen with pulmonary arterial hypertension. MEDIASTINUM: The heart and pericardium demonstrate no acute abnormality. There is a trace pericardial effusion. Normal caliber thoracic aorta. No aortic dissection. LYMPH NODES: No mediastinal, hilar or axillary lymphadenopathy. LUNGS AND PLEURA: Centrilobular and paraseptal emphysema with upper lung predominance. Airspace opacity in the anterior left lower lobe is presumably atelectasis, though pneumonia is not excluded. There is mild diffuse interlobular septal thickening. No pleural effusion or pneumothorax. 3 mm subpleural nodule in the left upper lobe on series 5 image 32. This is not visualized on 06/29/23. UPPER ABDOMEN: Reflux and contrast into the IVC and hepatic veins compatible with elevated right heart pressures. Upper abdomen otherwise unremarkable. SOFT TISSUES AND BONES: No acute bone or soft tissue abnormality. IMPRESSION: 1. No pulmonary embolism. 2. Mild diffuse interlobular septal thickening favoring interstitial edema . 3. Airspace opacity in the anterior left lower lobe, possibly atelectasis, though pneumonia is not excluded. 4. Dilated main pulmonary artery, measuring 38 mm in diameter, consistent with pulmonary arterial hypertension. 5. Centrilobular and paraseptal emphysema with upper lung predominance. 6. 3 mm subpleural nodule in the left upper lobe, not visualized on prior study. According to the Fleischner Society pulmonary nodule recommendations, the finding is consistent with a single solid nodule <6 mm in a high-risk patient, and the recommendation is optional CT at 12 months (particularly with  suspicious nodule morphology and/or upper lobe location). 7. Elevated right heart pressures. Electronically signed by: Norman Gatlin MD 08/25/2024 07:38 PM EDT RP Workstation: [id number]   US  Venous Img Lower Unilateral Right Result Date: 08/25/2024 CLINICAL DATA:  Right lower extremity pain and swelling. EXAM: RIGHT LOWER EXTREMITY VENOUS DOPPLER ULTRASOUND TECHNIQUE: Gray-scale sonography with graded compression, as well as color Doppler and duplex ultrasound were performed to evaluate the lower extremity deep venous systems from the level of the common femoral vein and including the common femoral, femoral, profunda femoral, popliteal and calf veins including the posterior tibial, peroneal and gastrocnemius veins when visible. The superficial great saphenous vein was also interrogated. Spectral Doppler was utilized to evaluate flow at rest and with distal augmentation maneuvers in the common femoral, femoral and popliteal veins. COMPARISON:  None Available. FINDINGS: Contralateral Common Femoral Vein: Respiratory phasicity is normal and symmetric with the symptomatic side. No evidence of thrombus. Normal compressibility. Common Femoral Vein: No evidence of thrombus. Normal compressibility, respiratory phasicity and response to augmentation. Saphenofemoral Junction: No evidence of thrombus. Normal compressibility and flow on color Doppler imaging. Profunda Femoral Vein: No evidence of thrombus. Normal compressibility and flow on color Doppler imaging. Femoral Vein: No evidence of thrombus. Normal compressibility, respiratory phasicity and response to augmentation. Popliteal Vein: No evidence of thrombus. Normal compressibility, respiratory phasicity and response to augmentation. Calf Veins: No evidence of thrombus. Normal compressibility and flow on color Doppler imaging. Superficial Great Saphenous Vein: No evidence of thrombus. Normal compressibility. Venous Reflux:  None. Other Findings: A 4.3 cm x 1.2 cm x  2.3 cm complex fluid collection is seen within the soft tissues of the right popliteal fossa. IMPRESSION: 1. No evidence of deep venous thrombosis within the RIGHT lower extremity. 2. Complex RIGHT Baker's cyst. Electronically Signed   By: Suzen Dials M.D.   On: 08/25/2024 18:06    Microbiology: Recent Results (from the  past 240 hours)  Resp panel by RT-PCR (RSV, Flu A&B, Covid) Anterior Nasal Swab     Status: None   Collection Time: 08/25/24  8:33 PM   Specimen: Anterior Nasal Swab  Result Value Ref Range Status   SARS Coronavirus 2 by RT PCR NEGATIVE NEGATIVE Final    Comment: (NOTE) SARS-CoV-2 target nucleic acids are NOT DETECTED.  The SARS-CoV-2 RNA is generally detectable in upper respiratory specimens during the acute phase of infection. The lowest concentration of SARS-CoV-2 viral copies this assay can detect is 138 copies/mL. A negative result does not preclude SARS-Cov-2 infection and should not be used as the sole basis for treatment or other patient management decisions. A negative result may occur with  improper specimen collection/handling, submission of specimen other than nasopharyngeal swab, presence of viral mutation(s) within the areas targeted by this assay, and inadequate number of viral copies(<138 copies/mL). A negative result must be combined with clinical observations, patient history, and epidemiological information. The expected result is Negative.  Fact Sheet for Patients:  BloggerCourse.com  Fact Sheet for Healthcare Providers:  SeriousBroker.it  This test is no t yet approved or cleared by the [place]  FDA and  has been authorized for detection and/or diagnosis of SARS-CoV-2 by FDA under an Emergency Use Authorization (EUA). This EUA will remain  in effect (meaning this test can be used) for the duration of the COVID-19 declaration under Section 564(b)(1) of the Act, 21 U.S.C.section  360bbb-3(b)(1), unless the authorization is terminated  or revoked sooner.       Influenza A by PCR NEGATIVE NEGATIVE Final   Influenza B by PCR NEGATIVE NEGATIVE Final    Comment: (NOTE) The Xpert Xpress SARS-CoV-2/FLU/RSV plus assay is intended as an aid in the diagnosis of influenza from Nasopharyngeal swab specimens and should not be used as a sole basis for treatment. Nasal washings and aspirates are unacceptable for Xpert Xpress SARS-CoV-2/FLU/RSV testing.  Fact Sheet for Patients: BloggerCourse.com  Fact Sheet for Healthcare Providers: SeriousBroker.it  This test is not yet approved or cleared by the [place]  FDA and has been authorized for detection and/or diagnosis of SARS-CoV-2 by FDA under an Emergency Use Authorization (EUA). This EUA will remain in effect (meaning this test can be used) for the duration of the COVID-19 declaration under Section 564(b)(1) of the Act, 21 U.S.C. section 360bbb-3(b)(1), unless the authorization is terminated or revoked.     Resp Syncytial Virus by PCR NEGATIVE NEGATIVE Final    Comment: (NOTE) Fact Sheet for Patients: BloggerCourse.com  Fact Sheet for Healthcare Providers: SeriousBroker.it  This test is not yet approved or cleared by the [place]  FDA and has been authorized for detection and/or diagnosis of SARS-CoV-2 by FDA under an Emergency Use Authorization (EUA). This EUA will remain in effect (meaning this test can be used) for the duration of the COVID-19 declaration under Section 564(b)(1) of the Act, 21 U.S.C. section 360bbb-3(b)(1), unless the authorization is terminated or revoked.  Performed at [facility], [address] [place], [place] [address]      Labs: CBC: Recent Labs  Lab 08/25/24 1807 08/25/24 2205 08/27/24 0358  WBC 9.7 9.3 8.4  HGB 17.2* 18.3* 19.1*  HCT 55.9* 57.5* 61.7*   MCV 101.1* 98.3 98.1  PLT 169 192 189   Basic Metabolic Panel: Recent Labs  Lab 08/25/24 1807 08/25/24 2205 08/26/24 0328 08/27/24 0358  NA 140  --  142 141  K 4.1  --  3.9 4.3  CL 98  --  96* 93*  CO2 34*  --  35* 33*  GLUCOSE 107*  --  92 140*  BUN 12  --  10 18  CREATININE 0.71 0.57 0.63 0.62  CALCIUM 8.7*  --  8.9 9.4  MG  --   --  1.8 2.1  PHOS  --   --  3.7 4.0   Liver Function Tests: No results for input(s): AST, ALT, ALKPHOS, BILITOT, PROT, ALBUMIN in the last 168 hours. No results for input(s): LIPASE, AMYLASE in the last 168 hours. No results for input(s): AMMONIA in the last 168 hours. Cardiac Enzymes: No results for input(s): CKTOTAL, CKMB, CKMBINDEX, TROPONINI in the last 168 hours. BNP (last 3 results) Recent Labs    08/25/24 1807  BNP 49.0   CBG: No results for input(s): GLUCAP in the last 168 hours.  Time spent: 35 minutes  Signed:  Elvan Sor  [facility] 08/27/2024 11:02 AM

## 2024-08-27 NOTE — Progress Notes (Signed)
 SATURATION QUALIFICATIONS: (This note is used to comply with regulatory documentation for home oxygen)  Patient Saturations on Room Air at Rest = 88%  Patient Saturations on Room Air while Ambulating = 88-90%    Please briefly explain why patient needs home oxygen:

## 2024-08-29 DIAGNOSIS — Z1231 Encounter for screening mammogram for malignant neoplasm of breast: Secondary | ICD-10-CM | POA: Diagnosis not present

## 2024-08-29 DIAGNOSIS — E559 Vitamin D deficiency, unspecified: Secondary | ICD-10-CM | POA: Diagnosis not present

## 2024-08-29 DIAGNOSIS — J189 Pneumonia, unspecified organism: Secondary | ICD-10-CM | POA: Diagnosis not present

## 2024-08-29 DIAGNOSIS — M7121 Synovial cyst of popliteal space [Baker], right knee: Secondary | ICD-10-CM | POA: Diagnosis not present

## 2024-08-29 DIAGNOSIS — R911 Solitary pulmonary nodule: Secondary | ICD-10-CM | POA: Diagnosis not present

## 2024-08-29 DIAGNOSIS — E538 Deficiency of other specified B group vitamins: Secondary | ICD-10-CM | POA: Diagnosis not present

## 2024-08-29 DIAGNOSIS — F17210 Nicotine dependence, cigarettes, uncomplicated: Secondary | ICD-10-CM | POA: Diagnosis not present

## 2024-08-30 ENCOUNTER — Encounter: Payer: Self-pay | Admitting: *Deleted

## 2024-09-06 ENCOUNTER — Encounter

## 2024-09-27 ENCOUNTER — Ambulatory Visit
Admission: RE | Admit: 2024-09-27 | Discharge: 2024-09-27 | Disposition: A | Discharge status: Home / Self Care | Source: Ambulatory Visit | Attending: Family Medicine | Admitting: Family Medicine

## 2024-09-27 DIAGNOSIS — Z1231 Encounter for screening mammogram for malignant neoplasm of breast: Secondary | ICD-10-CM | POA: Diagnosis present

## 2024-10-01 DIAGNOSIS — J449 Chronic obstructive pulmonary disease, unspecified: Secondary | ICD-10-CM | POA: Diagnosis not present

## 2024-10-01 DIAGNOSIS — Z7982 Long term (current) use of aspirin: Secondary | ICD-10-CM | POA: Diagnosis not present

## 2024-10-01 DIAGNOSIS — I2510 Atherosclerotic heart disease of native coronary artery without angina pectoris: Secondary | ICD-10-CM | POA: Diagnosis not present

## 2024-10-01 DIAGNOSIS — R9431 Abnormal electrocardiogram [ECG] [EKG]: Secondary | ICD-10-CM | POA: Diagnosis not present

## 2024-10-01 DIAGNOSIS — F17210 Nicotine dependence, cigarettes, uncomplicated: Secondary | ICD-10-CM | POA: Diagnosis not present

## 2024-10-01 DIAGNOSIS — R7309 Other abnormal glucose: Secondary | ICD-10-CM | POA: Diagnosis not present

## 2024-10-01 DIAGNOSIS — J9611 Chronic respiratory failure with hypoxia: Secondary | ICD-10-CM | POA: Diagnosis not present

## 2024-10-01 DIAGNOSIS — J439 Emphysema, unspecified: Secondary | ICD-10-CM | POA: Diagnosis not present

## 2024-10-05 ENCOUNTER — Ambulatory Visit: Admitting: Certified Registered"

## 2024-10-05 ENCOUNTER — Encounter
Admission: RE | Disposition: A | Discharge status: Home / Self Care | Payer: Self-pay | Source: Home / Self Care | Attending: Gastroenterology

## 2024-10-05 ENCOUNTER — Ambulatory Visit
Admission: RE | Admit: 2024-10-05 | Discharge: 2024-10-05 | Disposition: A | Discharge status: Home / Self Care | Attending: Gastroenterology | Admitting: Gastroenterology

## 2024-10-05 DIAGNOSIS — D123 Benign neoplasm of transverse colon: Secondary | ICD-10-CM | POA: Insufficient documentation

## 2024-10-05 DIAGNOSIS — I10 Essential (primary) hypertension: Secondary | ICD-10-CM | POA: Diagnosis not present

## 2024-10-05 DIAGNOSIS — Z1211 Encounter for screening for malignant neoplasm of colon: Secondary | ICD-10-CM | POA: Insufficient documentation

## 2024-10-05 DIAGNOSIS — F17210 Nicotine dependence, cigarettes, uncomplicated: Secondary | ICD-10-CM | POA: Insufficient documentation

## 2024-10-05 DIAGNOSIS — J4489 Other specified chronic obstructive pulmonary disease: Secondary | ICD-10-CM | POA: Insufficient documentation

## 2024-10-05 DIAGNOSIS — K640 First degree hemorrhoids: Secondary | ICD-10-CM | POA: Insufficient documentation

## 2024-10-05 DIAGNOSIS — Z800 Family history of malignant neoplasm of digestive organs: Secondary | ICD-10-CM | POA: Diagnosis not present

## 2024-10-05 DIAGNOSIS — Z9981 Dependence on supplemental oxygen: Secondary | ICD-10-CM | POA: Insufficient documentation

## 2024-10-05 DIAGNOSIS — D120 Benign neoplasm of cecum: Secondary | ICD-10-CM | POA: Diagnosis not present

## 2024-10-05 DIAGNOSIS — Z860101 Personal history of adenomatous and serrated colon polyps: Secondary | ICD-10-CM | POA: Diagnosis not present

## 2024-10-05 DIAGNOSIS — I2510 Atherosclerotic heart disease of native coronary artery without angina pectoris: Secondary | ICD-10-CM | POA: Diagnosis not present

## 2024-10-05 DIAGNOSIS — K635 Polyp of colon: Secondary | ICD-10-CM | POA: Diagnosis not present

## 2024-10-05 DIAGNOSIS — K6389 Other specified diseases of intestine: Secondary | ICD-10-CM | POA: Diagnosis not present

## 2024-10-05 HISTORY — DX: Atherosclerosis of aorta: I70.0

## 2024-10-05 HISTORY — DX: Chronic pain syndrome: G89.4

## 2024-10-05 HISTORY — DX: Hypoxemia: R09.02

## 2024-10-05 HISTORY — DX: Fibromyalgia: M79.7

## 2024-10-05 HISTORY — DX: Spondylosis without myelopathy or radiculopathy, lumbar region: M47.816

## 2024-10-05 HISTORY — DX: Other cervical disc degeneration, unspecified cervical region: M50.30

## 2024-10-05 HISTORY — DX: Dyspnea, unspecified: R06.00

## 2024-10-05 HISTORY — DX: Atherosclerotic heart disease of native coronary artery without angina pectoris: I25.10

## 2024-10-05 HISTORY — DX: Mixed hyperlipidemia: E78.2

## 2024-10-05 HISTORY — DX: Iridodialysis, right eye: H21.531

## 2024-10-05 HISTORY — DX: Other emphysema: J43.8

## 2024-10-05 HISTORY — PX: POLYPECTOMY: SHX149

## 2024-10-05 HISTORY — PX: COLONOSCOPY: SHX5424

## 2024-10-05 SURGERY — COLONOSCOPY
Anesthesia: General

## 2024-10-05 MED ORDER — ALBUTEROL SULFATE HFA 108 (90 BASE) MCG/ACT IN AERS
INHALATION_SPRAY | RESPIRATORY_TRACT | Status: DC | PRN
  Administered 2024-10-05: 2 via RESPIRATORY_TRACT

## 2024-10-05 MED ORDER — DEXMEDETOMIDINE HCL IN NACL 200 MCG/50ML IV SOLN
INTRAVENOUS | Status: DC | PRN
  Administered 2024-10-05: 12 ug via INTRAVENOUS

## 2024-10-05 MED ORDER — MIDAZOLAM HCL (PF) 2 MG/2ML IJ SOLN
INTRAMUSCULAR | Status: DC | PRN
  Administered 2024-10-05: 2 mg via INTRAVENOUS

## 2024-10-05 MED ORDER — GLYCOPYRROLATE 0.2 MG/ML IJ SOLN
INTRAMUSCULAR | Status: DC | PRN
  Administered 2024-10-05: .2 mg via INTRAVENOUS

## 2024-10-05 MED ORDER — SODIUM CHLORIDE 0.9 % IV SOLN: INTRAVENOUS | Status: DC

## 2024-10-05 MED ORDER — LIDOCAINE HCL (CARDIAC) PF 100 MG/5ML IV SOSY
PREFILLED_SYRINGE | INTRAVENOUS | Status: DC | PRN
  Administered 2024-10-05: 100 mg via INTRAVENOUS

## 2024-10-05 MED ORDER — PROPOFOL 10 MG/ML IV BOLUS
INTRAVENOUS | Status: DC | PRN
  Administered 2024-10-05 (×2): 20 mg via INTRAVENOUS
  Administered 2024-10-05: 50 mg via INTRAVENOUS

## 2024-10-05 MED ORDER — PROPOFOL 500 MG/50ML IV EMUL
INTRAVENOUS | Status: DC | PRN
  Administered 2024-10-05: 155 ug/kg/min via INTRAVENOUS

## 2024-10-05 MED ORDER — MIDAZOLAM HCL 2 MG/2ML IJ SOLN
INTRAMUSCULAR | Status: AC
  Filled 2024-10-05: qty 2

## 2024-10-05 NOTE — H&P (Signed)
 Outpatient short stay form Pre-procedure 10/05/2024  Gloria ONEIDA Schick, MD  Primary Physician: [facility]  Reason for visit:  Surveillance  History of present illness:    63 y/o lady with severe COPD here for surveillance colonoscopy. Last colonoscopy last year with poor prep. No blood thinners. No significant abdominal surgeries. Had an uncle with colon cancer.     Current Facility-Administered Medications:    0.9 %  sodium chloride  infusion, , Intravenous, Continuous, Draiden Mirsky, Gloria ONEIDA, MD, Last Rate: 20 mL/hr at 10/05/24 1333, Continued from Pre-op at 10/05/24 1333  Medications Prior to Admission  Medication Sig Dispense Refill Last Dose/Taking   albuterol  (PROVENTIL  HFA;VENTOLIN  HFA) 108 (90 BASE) MCG/ACT inhaler Inhale 1-2 puffs into the lungs every 4 (four) hours as needed for wheezing or shortness of breath. 1 Inhaler 0 10/05/2024   aspirin  EC 81 MG tablet Take 81 mg by mouth daily. Swallow whole.   10/04/2024   DULoxetine  (CYMBALTA ) 60 MG capsule Take 60 mg by mouth every morning.   10/04/2024   OXYGEN Inhale 2 L/min into the lungs as needed (DYSPNEA).   Taking As Needed   spironolactone  (ALDACTONE ) 25 MG tablet Take 25 mg by mouth daily.   10/05/2024   albuterol  (PROVENTIL ) (2.5 MG/3ML) 0.083% nebulizer solution Take 3 mLs (2.5 mg total) by nebulization every 4 (four) hours as needed for wheezing or shortness of breath. 75 mL 1    amLODipine  (NORVASC ) 5 MG tablet Take 1 tablet (5 mg total) by mouth daily. 90 tablet 3    cyanocobalamin  1000 MCG tablet Take 1 tablet (1,000 mcg total) by mouth daily. 30 tablet 2    Dextromethorphan -guaiFENesin  20-200 MG/20ML LIQD Take 20 mLs by mouth every 6 (six) hours as needed for cough. 118 mL 0    Fluticasone -Umeclidin-Vilant (TRELEGY ELLIPTA ) 100-62.5-25 MCG/ACT AEPB Inhale 1 puff into the lungs daily. Take as directed - one dose every 24 hours for COPD. 1 each 1    ipratropium-albuterol  (DUONEB) 0.5-2.5 (3) MG/3ML SOLN Take  3 mLs by nebulization every 6 (six) hours as needed. (Patient not taking: Reported on 08/25/2024) 360 mL 2    methocarbamol  (ROBAXIN ) 500 MG tablet Take 1 tablet (500 mg total) by mouth at bedtime as needed for muscle spasms. 20 tablet 0    pantoprazole  (PROTONIX ) 40 MG tablet Take 1 tablet (40 mg total) by mouth daily for 10 days. 10 tablet 0    Vitamin D , Ergocalciferol , (DRISDOL ) 1.25 MG (50000 UNIT) CAPS capsule Take 1 capsule (50,000 Units total) by mouth every 7 (seven) days. 12 capsule 0      Allergies  Allergen Reactions   Simvastatin     Other reaction(s): Muscle Pain   Statins     Other reaction(s): Other (See Comments) arthralgia   Fish Allergy    Lisinopril     Other reaction(s): Cough   Losartan     Other reaction(s): Headache   Pravastatin Other (See Comments)     Past Medical History:  Diagnosis Date   Allergic state    Aortic atherosclerosis    Asthma    Bronchitis    Chronic pain syndrome    COPD (chronic obstructive pulmonary disease) (HCC)    Coronary artery disease    DDD (degenerative disc disease), cervical    Dyspnea    Fibromyalgia    History of kidney stones    Hypertension    Iridodialysis of right eye    Mixed hyperlipidemia    Oxygen desaturation  Paraseptal emphysema (HCC)    Pre-diabetes    Pulmonary nodules    Respiratory failure (HCC)    chronic o2 2liters   Spondylosis of lumbar region without myelopathy or radiculopathy     Review of systems:  Otherwise negative.    Physical Exam  Gen: Alert, oriented. Appears stated age.  HEENT: PERRLA. Lungs: No respiratory distress CV: RRR Abd: soft, benign, no masses Ext: No edema    Planned procedures: Proceed with colonoscopy. The patient understands the nature of the planned procedure, indications, risks, alternatives and potential complications including but not limited to bleeding, infection, perforation, damage to internal organs and possible oversedation/side effects from  anesthesia. The patient agrees and gives consent to proceed.  Please refer to procedure notes for findings, recommendations and patient disposition/instructions.     Gloria ONEIDA Schick, MD [facility]

## 2024-10-05 NOTE — Interval H&P Note (Signed)
 History and Physical Interval Note:  10/05/2024 1:50 PM  Gloria Jones  has presented today for surgery, with the diagnosis of PH Colon Polyps.  The various methods of treatment have been discussed with the patient and family. After consideration of risks, benefits and other options for treatment, the patient has consented to  Procedure(s): COLONOSCOPY (N/A) as a surgical intervention.  The patient's history has been reviewed, patient examined, no change in status, stable for surgery.  I have reviewed the patient's chart and labs.  Questions were answered to the patient's satisfaction.     Ole ONEIDA Schick  Ok to proceed with colonoscopy

## 2024-10-05 NOTE — Anesthesia Preprocedure Evaluation (Signed)
 Anesthesia Evaluation  Patient identified by MRN, date of birth, ID band Patient awake    Reviewed: Allergy & Precautions, NPO status , Patient's Chart, lab work & pertinent test results  History of Anesthesia Complications Negative for: history of anesthetic complications  Airway Mallampati: III  TM Distance: <3 FB Neck ROM: full    Dental  (+) Upper Dentures, Lower Dentures   Pulmonary shortness of breath and with exertion, asthma , COPD,  oxygen dependent, Current Smoker and Patient abstained from smoking.   Pulmonary exam normal        Cardiovascular Exercise Tolerance: Good hypertension, + CAD and + DOE  Normal cardiovascular exam     Neuro/Psych negative neurological ROS  negative psych ROS   GI/Hepatic negative GI ROS, Neg liver ROS,,,  Endo/Other  negative endocrine ROS    Renal/GU      Musculoskeletal   Abdominal   Peds  Hematology negative hematology ROS (+)   Anesthesia Other Findings Past Medical History: No date: Allergic state No date: Asthma No date: Bronchitis No date: COPD (chronic obstructive pulmonary disease) (HCC) No date: History of kidney stones No date: Hypertension No date: Pre-diabetes No date: Pulmonary nodules No date: Respiratory failure (HCC)     Comment:  chronic o2 2liters  Past Surgical History: 06/28/2017: COLONOSCOPY WITH PROPOFOL ; N/A     Comment:  Procedure: COLONOSCOPY WITH PROPOFOL ;  Surgeon:               Gaylyn Gladis PENNER, MD;  Location: [facility] ENDOSCOPY;                Service: Endoscopy;  Laterality: N/A; 03/25/2023: COLONOSCOPY WITH PROPOFOL ; N/A     Comment:  Procedure: COLONOSCOPY WITH PROPOFOL ;  Surgeon:               Maryruth Ole DASEN, MD;  Location: [facility] ENDOSCOPY;                Service: Endoscopy;  Laterality: N/A;  DM 07/30/2022: CYSTOSCOPY/URETEROSCOPY/HOLMIUM LASER/STENT PLACEMENT;  Right     Comment:  Procedure: CYSTOSCOPY/URETEROSCOPY/HOLMIUM  LASER/STENT               PLACEMENT;  Surgeon: Francisca Redell BROCKS, MD;  Location:               [facility] ORS;  Service: Urology;  Laterality: Right; 06/28/2017: ESOPHAGOGASTRODUODENOSCOPY (EGD) WITH PROPOFOL ; N/A     Comment:  Procedure: ESOPHAGOGASTRODUODENOSCOPY (EGD) WITH               PROPOFOL ;  Surgeon: Gaylyn Gladis PENNER, MD;  Location:               [facility] ENDOSCOPY;  Service: Endoscopy;  Laterality: N/A; No date: KNEE SURGERY; Left     Comment:  meniscus repair 12/1977: OVARY SURGERY; Left     Comment:  partial ovary and fallopian tube removed  BMI    Body Mass Index: 34.28 kg/m      Reproductive/Obstetrics negative OB ROS                              Anesthesia Physical Anesthesia Plan  ASA: 4  Anesthesia Plan: General   Post-op Pain Management: Minimal or no pain anticipated   Induction: Intravenous  PONV Risk Score and Plan: 2 and Propofol  infusion, TIVA and Ondansetron   Airway Management Planned: Nasal Cannula  Additional Equipment: None  Intra-op Plan:   Post-operative Plan:   Informed Consent:  I have reviewed the patients History and Physical, chart, labs and discussed the procedure including the risks, benefits and alternatives for the proposed anesthesia with the patient or authorized representative who has indicated his/her understanding and acceptance.     Dental advisory given  Plan Discussed with: CRNA and Surgeon  Anesthesia Plan Comments: (Discussed risks of anesthesia with patient, including possibility of difficulty with spontaneous ventilation under anesthesia necessitating airway intervention, PONV, and rare risks such as cardiac or respiratory or neurological events, and allergic reactions. Discussed the role of CRNA in patient's perioperative care. Patient understands.)        Anesthesia Quick Evaluation

## 2024-10-05 NOTE — Transfer of Care (Signed)
 Immediate Anesthesia Transfer of Care Note  Patient: Gloria Jones  Procedure(s) Performed: COLONOSCOPY POLYPECTOMY, INTESTINE  Patient Location: Endoscopy Unit  Anesthesia Type:General  Level of Consciousness: drowsy and patient cooperative  Airway & Oxygen Therapy: Patient Spontanous Breathing and Patient connected to face mask oxygen  Post-op Assessment: Report given to RN and Post -op Vital signs reviewed and stable  Post vital signs: Reviewed and stable  Last Vitals:  Vitals Value Taken Time  BP 85/66 10/05/24 14:23  Temp    Pulse 105 10/05/24 14:23  Resp 26 10/05/24 14:23  SpO2 98 % 10/05/24 14:23  Vitals shown include unfiled device data.  Last Pain:  Vitals:   10/05/24 1248  TempSrc: Temporal  PainSc: 0-No pain         Complications: No notable events documented.

## 2024-10-05 NOTE — Anesthesia Procedure Notes (Signed)
 Procedure Name: General with mask airway Date/Time: 10/05/2024 1:57 PM  Performed by: Ledora Duncan, CRNAPre-anesthesia Checklist: Patient identified, Emergency Drugs available, Suction available and Patient being monitored Patient Re-evaluated:Patient Re-evaluated prior to induction Oxygen Delivery Method: Simple face mask Induction Type: IV induction Placement Confirmation: positive ETCO2 and breath sounds checked- equal and bilateral Dental Injury: Teeth and Oropharynx as per pre-operative assessment

## 2024-10-05 NOTE — Op Note (Signed)
 [facility] Patient Name: Gloria Jones Procedure Date: 10/05/2024 1:51 PM MRN: [id number] Account #: [id number] Date of Birth: [birth year] Admit Type: Outpatient Age: 63 Room: Operating Room Services ENDO ROOM 3 Gender: Female Note Status: Finalized Instrument Name: Colon Scope [id number] Procedure:             Colonoscopy Indications:           High risk colon cancer surveillance: Personal history                         of colonic polyps, Last colonoscopy 1 year ago Providers:             Ole Schick MD, MD Referring MD:          [facility] Medicines:             Monitored Anesthesia Care Complications:         No immediate complications. Estimated blood loss:                         Minimal. Procedure:             Pre-Anesthesia Assessment:                        - Prior to the procedure, a History and Physical was                         performed, and patient medications and allergies were                         reviewed. The patient is competent. The risks and                         benefits of the procedure and the sedation options and                         risks were discussed with the patient. All questions                         were answered and informed consent was obtained.                         Patient identification and proposed procedure were                         verified by the physician, the nurse, the                         anesthesiologist, the anesthetist and the technician                         in the endoscopy suite. Mental Status Examination:                         alert and oriented. Airway Examination: normal                         oropharyngeal airway and neck mobility. Respiratory  Examination: clear to auscultation. CV Examination:                         normal. Prophylactic Antibiotics: The patient does not                         require prophylactic antibiotics. Prior                          Anticoagulants: The patient has taken no anticoagulant                         or antiplatelet agents. ASA Grade Assessment: III - A                         patient with severe systemic disease. After reviewing                         the risks and benefits, the patient was deemed in                         satisfactory condition to undergo the procedure. The                         anesthesia plan was to use monitored anesthesia care                         (MAC). Immediately prior to administration of                         medications, the patient was re-assessed for adequacy                         to receive sedatives. The heart rate, respiratory                         rate, oxygen saturations, blood pressure, adequacy of                         pulmonary ventilation, and response to care were                         monitored throughout the procedure. The physical                         status of the patient was re-assessed after the                         procedure.                        After obtaining informed consent, the colonoscope was                         passed under direct vision. Throughout the procedure,                         the patient's blood pressure, pulse, and oxygen  saturations were monitored continuously. The                         Colonoscope was introduced through the anus and                         advanced to the the cecum, identified by appendiceal                         orifice and ileocecal valve. The colonoscopy was                         performed without difficulty. The patient tolerated                         the procedure well. The quality of the bowel                         preparation was fair. The ileocecal valve, appendiceal                         orifice, and rectum were photographed. Findings:      The perianal and digital rectal examinations were normal.      A 2 mm polyp was found in the cecum.  The polyp was sessile. The polyp       was removed with a jumbo cold forceps. Resection and retrieval were       complete. Estimated blood loss was minimal.      A 2 mm polyp was found in the proximal transverse colon. The polyp was       sessile. The polyp was removed with a cold snare. Resection and       retrieval were complete. Estimated blood loss was minimal.      A 10 mm polyp was found in the proximal transverse colon. The polyp was       sessile. The polyp was removed with a cold snare. Resection was       complete, but the polyp tissue was only partially retrieved. Estimated       blood loss was minimal.      Internal hemorrhoids were found during retroflexion. The hemorrhoids       were Grade I (internal hemorrhoids that do not prolapse).      The exam was otherwise without abnormality on direct and retroflexion       views. Impression:            - Preparation of the colon was fair.                        - One 2 mm polyp in the cecum, removed with a jumbo                         cold forceps. Resected and retrieved.                        - One 2 mm polyp in the proximal transverse colon,                         removed with a cold snare. Resected and retrieved.                        -  One 10 mm polyp in the proximal transverse colon,                         removed with a cold snare. Complete resection. Partial                         retrieval.                        - Internal hemorrhoids.                        - The examination was otherwise normal on direct and                         retroflexion views. Recommendation:        - Discharge patient to home.                        - Resume previous diet.                        - Continue present medications.                        - Await pathology results.                        - Repeat colonoscopy in 3 years for surveillance.                        - Return to referring physician as previously                          scheduled. Procedure Code(s):     --- Professional ---                        [phone], Colonoscopy, flexible; with removal of                         tumor(s), polyp(s), or other lesion(s) by snare                         technique                        45380, 59, Colonoscopy, flexible; with biopsy, single                         or multiple Diagnosis Code(s):     --- Professional ---                        Z86.010, Personal history of colonic polyps                        K64.0, First degree hemorrhoids                        D12.0, Benign neoplasm of cecum                        D12.3, Benign neoplasm  of transverse colon (hepatic                         flexure or splenic flexure) CPT copyright 2022 [employer]. All rights reserved. The codes documented in this report are preliminary and upon coder review may  be revised to meet current compliance requirements. Ole Schick MD, MD 10/05/2024 2:26:58 PM Number of Addenda: 0 Note Initiated On: 10/05/2024 1:51 PM Scope Withdrawal Time: 0 hours 14 minutes 48 seconds  Total Procedure Duration: 0 hours 19 minutes 30 seconds  Estimated Blood Loss:  Estimated blood loss was minimal.      [facility]

## 2024-10-06 ENCOUNTER — Encounter: Payer: Self-pay | Admitting: Gastroenterology

## 2024-10-06 NOTE — Anesthesia Postprocedure Evaluation (Signed)
 Anesthesia Post Note  Patient: Gloria Jones  Procedure(s) Performed: COLONOSCOPY POLYPECTOMY, INTESTINE  Patient location during evaluation: Endoscopy Anesthesia Type: General Level of consciousness: awake and alert Pain management: pain level controlled Vital Signs Assessment: post-procedure vital signs reviewed and stable Respiratory status: spontaneous breathing, nonlabored ventilation, respiratory function stable and patient connected to nasal cannula oxygen Cardiovascular status: blood pressure returned to baseline and stable Postop Assessment: no apparent nausea or vomiting Anesthetic complications: no   No notable events documented.   Last Vitals:  Vitals:   10/05/24 1445 10/05/24 1446  BP: 102/67   Pulse: 93 94  Resp: 17 17  Temp:    SpO2: 93% 94%    Last Pain:  Vitals:   10/05/24 1400  TempSrc: Temporal  PainSc:                  [name]

## 2024-10-08 LAB — SURGICAL PATHOLOGY

## 2024-10-23 DIAGNOSIS — Z1330 Encounter for screening examination for mental health and behavioral disorders, unspecified: Secondary | ICD-10-CM | POA: Diagnosis not present

## 2024-10-23 DIAGNOSIS — R7989 Other specified abnormal findings of blood chemistry: Secondary | ICD-10-CM | POA: Diagnosis not present

## 2024-10-23 DIAGNOSIS — Z0000 Encounter for general adult medical examination without abnormal findings: Secondary | ICD-10-CM | POA: Diagnosis not present

## 2024-10-23 DIAGNOSIS — Z5941 Food insecurity: Secondary | ICD-10-CM | POA: Diagnosis not present

## 2024-10-23 DIAGNOSIS — I10 Essential (primary) hypertension: Secondary | ICD-10-CM | POA: Diagnosis not present

## 2024-10-23 DIAGNOSIS — E1165 Type 2 diabetes mellitus with hyperglycemia: Secondary | ICD-10-CM | POA: Diagnosis not present

## 2024-10-23 DIAGNOSIS — Z23 Encounter for immunization: Secondary | ICD-10-CM | POA: Diagnosis not present

## 2024-10-23 DIAGNOSIS — Z1331 Encounter for screening for depression: Secondary | ICD-10-CM | POA: Diagnosis not present

## 2024-11-19 ENCOUNTER — Ambulatory Visit: Attending: Internal Medicine

## 2024-11-19 DIAGNOSIS — G4733 Obstructive sleep apnea (adult) (pediatric): Secondary | ICD-10-CM | POA: Insufficient documentation

## 2024-12-25 ENCOUNTER — Ambulatory Visit

## 2024-12-25 ENCOUNTER — Ambulatory Visit
Admission: EM | Admit: 2024-12-25 | Discharge: 2024-12-25 | Disposition: A | Discharge status: Home / Self Care | Attending: Emergency Medicine | Admitting: Emergency Medicine

## 2024-12-25 DIAGNOSIS — Z87442 Personal history of urinary calculi: Secondary | ICD-10-CM | POA: Diagnosis not present

## 2024-12-25 DIAGNOSIS — M5441 Lumbago with sciatica, right side: Secondary | ICD-10-CM | POA: Diagnosis not present

## 2024-12-25 DIAGNOSIS — R519 Headache, unspecified: Secondary | ICD-10-CM | POA: Diagnosis not present

## 2024-12-25 DIAGNOSIS — J019 Acute sinusitis, unspecified: Secondary | ICD-10-CM

## 2024-12-25 LAB — POCT URINE DIPSTICK
Bilirubin, UA: NEGATIVE
Blood, UA: NEGATIVE
Glucose, UA: NEGATIVE mg/dL
Ketones, POC UA: NEGATIVE mg/dL
Leukocytes, UA: NEGATIVE
Nitrite, UA: NEGATIVE
Protein Ur, POC: 30 mg/dL — AB
Spec Grav, UA: 1.02
Urobilinogen, UA: 1 U/dL
pH, UA: 6

## 2024-12-25 MED ORDER — IBUPROFEN 600 MG PO TABS: 600.0000 mg | ORAL_TABLET | Freq: Four times a day (QID) | ORAL | 0 refills | Status: AC | PRN

## 2024-12-25 MED ORDER — METHYLPREDNISOLONE 4 MG PO TBPK: ORAL_TABLET | Freq: Every day | ORAL | 0 refills | Status: DC

## 2024-12-25 MED ORDER — FLUTICASONE PROPIONATE 50 MCG/ACT NA SUSP: 2.0000 | Freq: Every day | NASAL | 0 refills | Status: AC

## 2024-12-25 MED ORDER — TIZANIDINE HCL 4 MG PO TABS: 4.0000 mg | ORAL_TABLET | Freq: Three times a day (TID) | ORAL | 0 refills | Status: AC | PRN

## 2024-12-25 NOTE — ED Provider Notes (Signed)
 HPI  SUBJECTIVE:  Gloria Jones is a 64 y.o. female who presents with 2 issues:  First, she reports right lower back pain for the past 4 days described as constant tightness with radiation down the back of her leg.  She states the pain is worse at night.  No trauma to the area, change in physical activity, recent heavy lifting or repetitive movements.  No rash, saddle anesthesia, urinary fecal incontinence, urinary retention, fevers, abdominal pain, flank pain, urinary complaints, hematuria.  She states that the pain is worse at night.  She states that she has had previous symptoms like this before which was found to be obstructing nephrolithiasis.  She tried 660 mg of Aleve  twice, Advil  600 mg without improvement in her symptoms.  Symptoms are worse with bending forward.  No antipyretic in the past 6 hours.  She also reports intermittent, hours long, frontal headache over the past 4 days.  She states that her right ear is sore and reports mild photophobia.  No fevers, nasal congestion, rhinorrhea, visual changes, face/arm/leg numbness/tingling/weakness, slurred speech, facial droop, aphasia, neck stiffness, rash.  She has been taking Aleve  and Advil  with improvement.  No aggravating factors.  She has had similar headaches like this before with sinus infections.  She has a past medical history of severe COPD on home O2, coronary artery disease, fibromyalgia, obstructing nephrolithiasis, hypertension, hyperlipidemia, lumbar spondylosis, prolonged steroid use.  No history of chronic kidney disease, malignancy, osteoporosis, sciatica.  PCP: [facility].  Past Medical History:  Diagnosis Date   Allergic state    Aortic atherosclerosis    Asthma    Bronchitis    Chronic pain syndrome    COPD (chronic obstructive pulmonary disease) (HCC)    Coronary artery disease    DDD (degenerative disc disease), cervical    Dyspnea    Fibromyalgia    History of kidney stones    Hypertension     Iridodialysis of right eye    Mixed hyperlipidemia    Oxygen desaturation    Paraseptal emphysema (HCC)    Pre-diabetes    Pulmonary nodules    Respiratory failure (HCC)    chronic o2 2liters   Spondylosis of lumbar region without myelopathy or radiculopathy     Past Surgical History:  Procedure Laterality Date   CATARACT EXTRACTION Bilateral    COLONOSCOPY N/A 10/05/2024   Procedure: COLONOSCOPY;  Surgeon: Maryruth Ole DASEN, MD;  Location: [facility] ENDOSCOPY;  Service: Endoscopy;  Laterality: N/A;   COLONOSCOPY WITH PROPOFOL  N/A 06/28/2017   Procedure: COLONOSCOPY WITH PROPOFOL ;  Surgeon: Gaylyn Gladis PENNER, MD;  Location: [facility] ENDOSCOPY;  Service: Endoscopy;  Laterality: N/A;   COLONOSCOPY WITH PROPOFOL  N/A 03/25/2023   Procedure: COLONOSCOPY WITH PROPOFOL ;  Surgeon: Maryruth Ole DASEN, MD;  Location: [facility] ENDOSCOPY;  Service: Endoscopy;  Laterality: N/A;  DM   COLONOSCOPY WITH PROPOFOL  N/A 07/11/2023   Procedure: COLONOSCOPY WITH PROPOFOL ;  Surgeon: Maryruth Ole DASEN, MD;  Location: [facility] ENDOSCOPY;  Service: Endoscopy;  Laterality: N/A;   CYSTOSCOPY/URETEROSCOPY/HOLMIUM LASER/STENT PLACEMENT Right 07/30/2022   Procedure: CYSTOSCOPY/URETEROSCOPY/HOLMIUM LASER/STENT PLACEMENT;  Surgeon: Francisca Redell BROCKS, MD;  Location: [facility] ORS;  Service: Urology;  Laterality: Right;   ESOPHAGOGASTRODUODENOSCOPY (EGD) WITH PROPOFOL  N/A 06/28/2017   Procedure: ESOPHAGOGASTRODUODENOSCOPY (EGD) WITH PROPOFOL ;  Surgeon: Gaylyn Gladis PENNER, MD;  Location: [facility] ENDOSCOPY;  Service: Endoscopy;  Laterality: N/A;   EYE SURGERY     KNEE SURGERY Left    meniscus repair   OVARY SURGERY Left 12/1977   partial ovary  and fallopian tube removed   POLYPECTOMY  07/11/2023   Procedure: POLYPECTOMY;  Surgeon: Maryruth Ole DASEN, MD;  Location: [facility] ENDOSCOPY;  Service: Endoscopy;;   POLYPECTOMY  10/05/2024   Procedure: POLYPECTOMY, INTESTINE;  Surgeon: Maryruth Ole DASEN, MD;  Location: [facility] ENDOSCOPY;  Service:  Endoscopy;;   REPAIR IRIS & CILIARY BODY Right     Family History  Problem Relation Age of Onset   Hypertension Mother    Colon cancer Maternal Uncle    Breast cancer Maternal Aunt        mat great aunt    Social History[1]  Current Medications[2]  Allergies[3]   ROS  As noted in HPI.   Physical Exam  BP 125/79 (BP Location: Left Arm)   Pulse 78   Temp 98.4 F (36.9 C) (Oral)   Resp 14   Wt 89.4 kg   LMP 02/08/2015   SpO2 (!) 89%   BMI 34.90 kg/m   Constitutional: Well developed, well nourished, no acute distress Eyes:  EOMI, conjunctiva normal bilaterally.  No direct or consensual photophobia. HENT: Normocephalic, atraumatic,mucus membranes moist.  TMs normal bilaterally.  Mild maxillary, frontal sinus tenderness.  No nasal congestion.  Normal nasal mucosa.  No tenderness over the temporal arteries.  TMs normal bilaterally.  Edentulous-has dentures. Neck: No cervical lymphadenopathy, meningismus. Respiratory: Normal inspiratory effort Cardiovascular: Normal rate GI: nondistended. No suprapubic, flank tenderness skin: No rash, skin intact Musculoskeletal: Questionable right CVAT.  Positive right paralumbar tenderness, + muscle spasm.  Tenderness over the L-spine, no SI joint, sacral bony tenderness. Bilateral lower extremities nontender, baseline ROM with intact PT pulses.  Sensation between thighs intact.  Pain aggravated with active right hip flexion against resistance.  No pain with passive int/ext rotation, extension, AD/ABduction hips bilaterally. SLR neg bilaterally, right back pain aggravated with right SLR. Sensation baseline light touch bilaterally for Pt, DTR's symmetric and intact bilaterally KJ, Motor symmetric bilateral 5/5 hip flexion, quadriceps, hamstrings, EHL, foot dorsiflexion, foot plantarflexion, pain aggravated with large positional changes. Neurologic: Alert & oriented x 3, no focal neuro deficits Psychiatric: Speech and behavior  appropriate   ED Course   Medications - No data to display  Orders Placed This Encounter  Procedures   DG Lumbar Spine Complete    Standing Status:   Standing    Number of Occurrences:   1    Reason for Exam (SYMPTOM  OR DIAGNOSIS REQUIRED):   midline tenderness L4-S1 h/o prolonged steriod use, lumbar spondylesis,  r/o acute changes.   DG Abd 1 View    Standing Status:   Standing    Number of Occurrences:   1    Reason for Exam (SYMPTOM  OR DIAGNOSIS REQUIRED):   History of obstructing nephrolithiasis, right and midline back pain.  Rule out recurrent nephrolithiasis.   POC Urinalysis Dipstick    Standing Status:   Standing    Number of Occurrences:   1    No results found for this or any previous visit (from the past 24 hours). DG Abd 1 View Result Date: 12/25/2024 EXAM: 1 VIEW XRAY OF THE ABDOMEN 12/25/2024 07:23:20 PM COMPARISON: CT 07/04/2020. Plain Films 07/20/2022. CLINICAL HISTORY: History of obstructing nephrolithiasis, right and midline back pain. Rule out recurrent nephrolithiasis. FINDINGS: BOWEL: Nonobstructive bowel gas pattern. Stones could be obscured by overlying stool. SOFT TISSUES: Previously seen right lower pole nephrolithiasis not definitively seen. No visible suspicious calcification. BONES: No acute fracture. IMPRESSION: 1. Previously seen right lower pole nephrolithiasis is not definitively seen,  possibly obscured by overlying stool, with no visible suspicious calcification. 2. No acute findings. Electronically signed by: Franky Crease MD 12/25/2024 07:39 PM EST RP Workstation: [id number]   DG Lumbar Spine Complete Result Date: 12/25/2024 EXAM: 4 VIEW(S) XRAY OF THE LUMBAR SPINE 12/25/2024 07:23:08 PM COMPARISON: 09/27/2023 CLINICAL HISTORY: midline tenderness L4-S1 h/o prolonged steriod use, lumbar spondylesis, r/o acute changes. FINDINGS: LUMBAR SPINE: BONES: Vertebral body heights are maintained. Alignment is normal. No fracture or subluxation. DISCS AND DEGENERATIVE  CHANGES: Advanced diffuse degenerative facet disease, most pronounced from L3-L4 through L5-S1. Disc space narrowing at L4-L5 and L5-S1 with early anterior spurring and vacuum disc at L5-S1. SOFT TISSUES: Aortic atherosclerosis. No aneurysm. IMPRESSION: 1. No acute osseous abnormality. 2. Multilevel degenerative disc and facet disease as above. Electronically signed by: Franky Crease MD 12/25/2024 07:38 PM EST RP Workstation: [id number]    ED Clinical Impression  1. Acute right-sided low back pain with right-sided sciatica   2. History of nephrolithiasis   3. Acute non-recurrent sinusitis, unspecified location   4. Acute nonintractable headache, unspecified headache type      ED Assessment/Plan    Outside labs reviewed.  Calculate creatinine clearance from labs done in September 2025 127 mL/min.   Patient presents with of hypoxia oxygen saturation 89%.  On supplemental oxygen.  Previous O2 sats 90 to 94% per chart review.  Patient states that her oxygen has been running between 88 and 90% recently.  She has not needed to change her oxygen settings and denies chest pain, change in her baseline shortness of breath, cough or wheezing.  1.  Back pain.  Given history of obstructing nephrolithiasis and questionable right CVAT, will check a KUB and UA  Also checking an L-spine given prolonged history of steroid use to rule out any acute changes.  Reviewed imaging independently.  No radiopaque stones.  Arthritis L-spine.  No acute changes in the L-spine.  Formal radiology overread pending.  Discussed this with patient.  Will contact patient at [phone] if radiology overread differs enough from mine and we need to change management.    Reviewed radiology report.  Advanced diffuse degenerative facet disease most pronounced from L3-4 through L5-S1.  Disc space narrowing at L4-L5 and L5-S1.  No obvious radiopaque stones.  Radiology findings consistent with my read.  See radiology report for  details  UA negative for UTI, blood.  Will not send this off for culture.   Pt ambulatory in the UC. Home with 1000 mg Tylenol  combined with 600 mg ibuprofen  3 or 4 times a day as needed for pain, Medrol  Dosepak, Zanaflex .  Pt to f/u with PMD and [facility].  Discussed labs, medical decision-making, and plan for follow-up with the patient.  Discussed signs and symptoms that should prompt return to the emergency department.  Patient agrees with plan.  2.  Headache.  She has frontal and maxillary sinus tenderness, but no purulent nasal drainage.  There is no evidence of meningitis, acute intracranial process, temporal arteritis.  Considered hypoxia as cause of her headache, but she states that her oxygen saturation has been at baseline.  Tylenol /ibuprofen , Medrol  dose pack for inflammation sinuses.  Saline nasal irrigation.  Follow-up PCP as needed.  ER return precautions given.  Meds ordered this encounter  Medications   methylPREDNISolone  (MEDROL  DOSEPAK) 4 MG TBPK tablet    Sig: Take by mouth daily. Follow package instructions    Dispense:  21 tablet    Refill:  0   tiZANidine  (ZANAFLEX ) 4  MG tablet    Sig: Take 1 tablet (4 mg total) by mouth every 8 (eight) hours as needed for muscle spasms.    Dispense:  30 tablet    Refill:  0   ibuprofen  (ADVIL ) 600 MG tablet    Sig: Take 1 tablet (600 mg total) by mouth every 6 (six) hours as needed for up to 7 days.    Dispense:  28 tablet    Refill:  0   fluticasone  (FLONASE ) 50 MCG/ACT nasal spray    Sig: Place 2 sprays into both nostrils daily.    Dispense:  16 g    Refill:  0    *This clinic note was created using Scientist, clinical (histocompatibility and immunogenetics). Therefore, there may be occasional mistakes despite careful proofreading.  ?       [1]  Social History Tobacco Use   Smoking status: Every Day    Current packs/day: 1.00    Types: Cigarettes    Passive exposure: Current   Smokeless tobacco: Never  Vaping Use   Vaping status:  Never Used  Substance Use Topics   Alcohol use: Not Currently    Comment: occasionally,non last 24hrs   Drug use: Yes    Types: Marijuana    Comment: daily  [2] No current facility-administered medications for this encounter.  Current Outpatient Medications:    amLODipine  (NORVASC ) 5 MG tablet, Take 1 tablet (5 mg total) by mouth daily., Disp: 90 tablet, Rfl: 3   aspirin  EC 81 MG tablet, Take 81 mg by mouth daily. Swallow whole., Disp: , Rfl:    DULoxetine  (CYMBALTA ) 60 MG capsule, Take 60 mg by mouth every morning., Disp: , Rfl:    fluticasone  (FLONASE ) 50 MCG/ACT nasal spray, Place 2 sprays into both nostrils daily., Disp: 16 g, Rfl: 0   ibuprofen  (ADVIL ) 600 MG tablet, Take 1 tablet (600 mg total) by mouth every 6 (six) hours as needed for up to 7 days., Disp: 28 tablet, Rfl: 0   methylPREDNISolone  (MEDROL  DOSEPAK) 4 MG TBPK tablet, Take by mouth daily. Follow package instructions, Disp: 21 tablet, Rfl: 0   OXYGEN, Inhale 2 L/min into the lungs as needed (DYSPNEA)., Disp: , Rfl:    spironolactone  (ALDACTONE ) 25 MG tablet, Take 25 mg by mouth daily., Disp: , Rfl:    tiZANidine  (ZANAFLEX ) 4 MG tablet, Take 1 tablet (4 mg total) by mouth every 8 (eight) hours as needed for muscle spasms., Disp: 30 tablet, Rfl: 0   albuterol  (PROVENTIL  HFA;VENTOLIN  HFA) 108 (90 BASE) MCG/ACT inhaler, Inhale 1-2 puffs into the lungs every 4 (four) hours as needed for wheezing or shortness of breath., Disp: 1 Inhaler, Rfl: 0   albuterol  (PROVENTIL ) (2.5 MG/3ML) 0.083% nebulizer solution, Take 3 mLs (2.5 mg total) by nebulization every 4 (four) hours as needed for wheezing or shortness of breath., Disp: 75 mL, Rfl: 1   Fluticasone -Umeclidin-Vilant (TRELEGY ELLIPTA ) 100-62.5-25 MCG/ACT AEPB, Inhale 1 puff into the lungs daily. Take as directed - one dose every 24 hours for COPD., Disp: 1 each, Rfl: 1   ipratropium-albuterol  (DUONEB) 0.5-2.5 (3) MG/3ML SOLN, Take 3 mLs by nebulization every 6 (six) hours as needed.  (Patient not taking: Reported on 08/25/2024), Disp: 360 mL, Rfl: 2   pantoprazole  (PROTONIX ) 40 MG tablet, Take 1 tablet (40 mg total) by mouth daily for 10 days., Disp: 10 tablet, Rfl: 0 [3]  Allergies Allergen Reactions   Simvastatin     Other reaction(s): Muscle Pain   Statins     Other  reaction(s): Other (See Comments) arthralgia   Fish Allergy    Lisinopril     Other reaction(s): Cough   Losartan     Other reaction(s): Headache   Pravastatin Other (See Comments)     Giannie Soliday, MD 12/27/24 1022

## 2024-12-25 NOTE — Discharge Instructions (Addendum)
 I did not appreciate any acute changes or kidney stones on your x-rays.  Your urinalysis is negative for blood and infection.  Drink extra fluids.  We will contact you if the radiology overread differs enough from mine and we need to change management.  600 mg of ibuprofen  combined with 1000 mg of tylenol  3 or 4 times a day.  Try the muscle relaxant.  Heat has been shown to be beneficial.  Many people also find gentle stretching and deep tissue massage helpful.  Follow-up with [facility].  Flonase , saline nasal irrigation with NeilMed sinus rinse and distilled water as often as you want.  Tylenol  combined with ibuprofen  for your back and Medrol  Dosepak will also help with inflammation in your sinuses.  There are no clear indications for antibiotics at this time.  Please follow-up with your primary care provider if you are not better in 6 days for reevaluation and possible antibiotics at that time.  Go to the ER for the signs and symptoms we discussed.

## 2024-12-25 NOTE — ED Triage Notes (Signed)
 Patient states that she's been waking up with a headache for the past 4 days. Patient states that the headaches go away after she takes advil . Patient states that she's also been having left sided back pain that radiates down her leg for the past 2 days

## 2024-12-26 ENCOUNTER — Ambulatory Visit: Payer: Self-pay

## 2025-01-02 ENCOUNTER — Inpatient Hospital Stay
Admission: EM | Admit: 2025-01-02 | Discharge: 2025-01-08 | DRG: 190 | Disposition: A | Discharge status: Home / Self Care | Attending: Internal Medicine | Admitting: Internal Medicine

## 2025-01-02 ENCOUNTER — Emergency Department

## 2025-01-02 ENCOUNTER — Other Ambulatory Visit: Payer: Self-pay

## 2025-01-02 DIAGNOSIS — B370 Candidal stomatitis: Secondary | ICD-10-CM | POA: Diagnosis present

## 2025-01-02 DIAGNOSIS — E875 Hyperkalemia: Secondary | ICD-10-CM | POA: Diagnosis present

## 2025-01-02 DIAGNOSIS — R7303 Prediabetes: Secondary | ICD-10-CM | POA: Diagnosis present

## 2025-01-02 DIAGNOSIS — J9601 Acute respiratory failure with hypoxia: Secondary | ICD-10-CM | POA: Diagnosis present

## 2025-01-02 DIAGNOSIS — Z9981 Dependence on supplemental oxygen: Secondary | ICD-10-CM

## 2025-01-02 DIAGNOSIS — J9602 Acute respiratory failure with hypercapnia: Secondary | ICD-10-CM | POA: Diagnosis present

## 2025-01-02 DIAGNOSIS — Z1152 Encounter for screening for COVID-19: Secondary | ICD-10-CM

## 2025-01-02 DIAGNOSIS — J441 Chronic obstructive pulmonary disease with (acute) exacerbation: Principal | ICD-10-CM | POA: Diagnosis present

## 2025-01-02 DIAGNOSIS — B369 Superficial mycosis, unspecified: Secondary | ICD-10-CM | POA: Diagnosis present

## 2025-01-02 DIAGNOSIS — Z800 Family history of malignant neoplasm of digestive organs: Secondary | ICD-10-CM

## 2025-01-02 DIAGNOSIS — Z6833 Body mass index (BMI) 33.0-33.9, adult: Secondary | ICD-10-CM

## 2025-01-02 DIAGNOSIS — Z87442 Personal history of urinary calculi: Secondary | ICD-10-CM

## 2025-01-02 DIAGNOSIS — I2510 Atherosclerotic heart disease of native coronary artery without angina pectoris: Secondary | ICD-10-CM | POA: Diagnosis present

## 2025-01-02 DIAGNOSIS — J9622 Acute and chronic respiratory failure with hypercapnia: Secondary | ICD-10-CM | POA: Diagnosis present

## 2025-01-02 DIAGNOSIS — J438 Other emphysema: Secondary | ICD-10-CM | POA: Diagnosis present

## 2025-01-02 DIAGNOSIS — E782 Mixed hyperlipidemia: Secondary | ICD-10-CM | POA: Diagnosis present

## 2025-01-02 DIAGNOSIS — Z888 Allergy status to other drugs, medicaments and biological substances status: Secondary | ICD-10-CM

## 2025-01-02 DIAGNOSIS — Z9842 Cataract extraction status, left eye: Secondary | ICD-10-CM

## 2025-01-02 DIAGNOSIS — Z8249 Family history of ischemic heart disease and other diseases of the circulatory system: Secondary | ICD-10-CM

## 2025-01-02 DIAGNOSIS — Z79899 Other long term (current) drug therapy: Secondary | ICD-10-CM

## 2025-01-02 DIAGNOSIS — E66811 Obesity, class 1: Secondary | ICD-10-CM | POA: Diagnosis present

## 2025-01-02 DIAGNOSIS — Z91013 Allergy to seafood: Secondary | ICD-10-CM

## 2025-01-02 DIAGNOSIS — Z803 Family history of malignant neoplasm of breast: Secondary | ICD-10-CM

## 2025-01-02 DIAGNOSIS — E669 Obesity, unspecified: Secondary | ICD-10-CM | POA: Insufficient documentation

## 2025-01-02 DIAGNOSIS — I10 Essential (primary) hypertension: Secondary | ICD-10-CM | POA: Diagnosis present

## 2025-01-02 DIAGNOSIS — Z7982 Long term (current) use of aspirin: Secondary | ICD-10-CM

## 2025-01-02 DIAGNOSIS — G4733 Obstructive sleep apnea (adult) (pediatric): Secondary | ICD-10-CM | POA: Diagnosis present

## 2025-01-02 DIAGNOSIS — F17210 Nicotine dependence, cigarettes, uncomplicated: Secondary | ICD-10-CM | POA: Diagnosis present

## 2025-01-02 DIAGNOSIS — M797 Fibromyalgia: Secondary | ICD-10-CM | POA: Diagnosis present

## 2025-01-02 DIAGNOSIS — J9621 Acute and chronic respiratory failure with hypoxia: Secondary | ICD-10-CM | POA: Diagnosis present

## 2025-01-02 DIAGNOSIS — Z9841 Cataract extraction status, right eye: Secondary | ICD-10-CM

## 2025-01-02 DIAGNOSIS — D751 Secondary polycythemia: Secondary | ICD-10-CM | POA: Diagnosis present

## 2025-01-02 DIAGNOSIS — G894 Chronic pain syndrome: Secondary | ICD-10-CM | POA: Diagnosis present

## 2025-01-02 LAB — CBC WITH DIFFERENTIAL/PLATELET
Abs Immature Granulocytes: 0.04 K/uL (ref 0.00–0.07)
Basophils Absolute: 0 K/uL (ref 0.0–0.1)
Basophils Relative: 0 %
Eosinophils Absolute: 0.1 K/uL (ref 0.0–0.5)
Eosinophils Relative: 1 %
HCT: 55.2 % — ABNORMAL HIGH (ref 36.0–46.0)
Hemoglobin: 16.8 g/dL — ABNORMAL HIGH (ref 12.0–15.0)
Immature Granulocytes: 0 %
Lymphocytes Relative: 20 %
Lymphs Abs: 2.2 K/uL (ref 0.7–4.0)
MCH: 31.2 pg (ref 26.0–34.0)
MCHC: 30.4 g/dL (ref 30.0–36.0)
MCV: 102.6 fL — ABNORMAL HIGH (ref 80.0–100.0)
Monocytes Absolute: 1.3 K/uL — ABNORMAL HIGH (ref 0.1–1.0)
Monocytes Relative: 12 %
Neutro Abs: 7.3 K/uL (ref 1.7–7.7)
Neutrophils Relative %: 67 %
Platelets: 212 K/uL (ref 150–400)
RBC: 5.38 MIL/uL — ABNORMAL HIGH (ref 3.87–5.11)
RDW: 13.3 % (ref 11.5–15.5)
WBC: 11 K/uL — ABNORMAL HIGH (ref 4.0–10.5)
nRBC: 0 % (ref 0.0–0.2)

## 2025-01-02 LAB — BASIC METABOLIC PANEL WITH GFR
Anion gap: 7 (ref 5–15)
BUN: 14 mg/dL (ref 8–23)
CO2: 36 mmol/L — ABNORMAL HIGH (ref 22–32)
Calcium: 8.9 mg/dL (ref 8.9–10.3)
Chloride: 97 mmol/L — ABNORMAL LOW (ref 98–111)
Creatinine, Ser: 0.54 mg/dL (ref 0.44–1.00)
GFR, Estimated: >60 mL/min
Glucose, Bld: 131 mg/dL — ABNORMAL HIGH (ref 70–99)
Potassium: 4.6 mmol/L (ref 3.5–5.1)
Sodium: 139 mmol/L (ref 135–145)

## 2025-01-02 LAB — RESP PANEL BY RT-PCR (RSV, FLU A&B, COVID)  RVPGX2
Influenza A by PCR: NEGATIVE
Influenza B by PCR: NEGATIVE
Resp Syncytial Virus by PCR: NEGATIVE
SARS Coronavirus 2 by RT PCR: NEGATIVE

## 2025-01-02 MED ORDER — IPRATROPIUM-ALBUTEROL 0.5-2.5 (3) MG/3ML IN SOLN
3.0000 mL | Freq: Once | RESPIRATORY_TRACT | Status: AC
  Administered 2025-01-02: 3 mL via RESPIRATORY_TRACT
  Filled 2025-01-02: qty 3

## 2025-01-02 MED ORDER — METHYLPREDNISOLONE SODIUM SUCC 125 MG IJ SOLR
125.0000 mg | Freq: Once | INTRAMUSCULAR | Status: AC
  Administered 2025-01-02: 125 mg via INTRAVENOUS
  Filled 2025-01-02: qty 2

## 2025-01-02 MED ORDER — SODIUM CHLORIDE 0.9 % IV SOLN
1.0000 g | Freq: Once | INTRAVENOUS | Status: AC
  Administered 2025-01-03: 1 g via INTRAVENOUS
  Filled 2025-01-02: qty 10

## 2025-01-02 NOTE — ED Provider Notes (Signed)
 "  [facility] Provider Note    Event Date/Time   First MD Initiated Contact with Patient 01/02/25 2130     (approximate)   History   Shortness of Breath   HPI {Remember to add pertinent medical, surgical, social, and/or OB history to HPI:1} Gloria Jones is a 64 y.o. female  ***       Physical Exam   Triage Vital Signs: ED Triage Vitals  Encounter Vitals Group     BP 01/02/25 2001 131/80     Girls Systolic BP Percentile --      Girls Diastolic BP Percentile --      Boys Systolic BP Percentile --      Boys Diastolic BP Percentile --      Pulse Rate 01/02/25 2001 85     Resp 01/02/25 2002 20     Temp 01/02/25 2001 99.4 F (37.4 C)     Temp src --      SpO2 01/02/25 2001 92 %     Weight 01/02/25 2000 190 lb (86.2 kg)     Height 01/02/25 2000 5' 3 (1.6 m)     Head Circumference --      Peak Flow --      Pain Score 01/02/25 2000 10     Pain Loc --      Pain Education --      Exclude from Growth Chart --     Most recent vital signs: Vitals:   01/02/25 2001 01/02/25 2002  BP: 131/80   Pulse: 85   Resp:  20  Temp: 99.4 F (37.4 C)   SpO2: 92%     {Only need to document appropriate and relevant physical exam:1} General: Awake, no distress. *** CV:  Good peripheral perfusion. *** Resp:  Normal effort. *** Abd:  No distention. *** Other:  ***   ED Results / Procedures / Treatments   Labs (all labs ordered are listed, but only abnormal results are displayed) Labs Reviewed  CBC WITH DIFFERENTIAL/PLATELET - Abnormal; Notable for the following components:      Result Value   WBC 11.0 (*)    RBC 5.38 (*)    Hemoglobin 16.8 (*)    HCT 55.2 (*)    MCV 102.6 (*)    Monocytes Absolute 1.3 (*)    All other components within normal limits  BASIC METABOLIC PANEL WITH GFR - Abnormal; Notable for the following components:   Chloride 97 (*)    CO2 36 (*)    Glucose, Bld 131 (*)    All other components within normal limits  RESP  PANEL BY RT-PCR (RSV, FLU A&B, COVID)  [id number]     EKG  I, Guadalupe Eagles, attending physician, personally viewed and interpreted this EKG  EKG Time: 2006 Rate: 85 Rhythm: normal sinus rhythm Axis: rightward axis Intervals: qtc 445 QRS: low voltage QRS ST changes: no st elevation Impression: abnormal ekg   RADIOLOGY I independently interpreted and visualized the CXR. My interpretation: No pneumonia Radiology interpretation:  IMPRESSION:  1. Enlarged cardiac shadow and vascular congestion without significant  interstitial edema.  2. No focal confluent infiltrate or sizable effusion.      PROCEDURES:  Critical Care performed: Yes  CRITICAL CARE Performed by: Guadalupe Eagles   Total critical care time: *** minutes  Critical care time was exclusive of separately billable procedures and treating other patients.  Critical care was necessary to treat or prevent imminent or life-threatening deterioration.  Critical care was  time spent personally by me on the following activities: development of treatment plan with patient and/or surrogate as well as nursing, discussions with consultants, evaluation of patient's response to treatment, examination of patient, obtaining history from patient or surrogate, ordering and performing treatments and interventions, ordering and review of laboratory studies, ordering and review of radiographic studies, pulse oximetry and re-evaluation of patient's condition.   Procedures    MEDICATIONS ORDERED IN ED: Medications - No data to display   IMPRESSION / MDM / ASSESSMENT AND PLAN / ED COURSE  I reviewed the triage vital signs and the nursing notes.                              Differential diagnosis includes, but is not limited to, ***  Patient's presentation is most consistent with {EM COPA:27473}   ***The patient is on the cardiac monitor to evaluate for evidence of arrhythmia and/or significant heart rate changes.  ***       FINAL CLINICAL IMPRESSION(S) / ED DIAGNOSES   Final diagnoses:  None        Rx / DC Orders   ED Discharge Orders     None        Note:  This document was prepared using Dragon voice recognition software and may include unintentional dictation errors.  "

## 2025-01-02 NOTE — ED Triage Notes (Signed)
 Pt reports generalized body aches productive cough with green phlegm for the past few days.  Pt wears 2L [place] chronically, pt is 84% pt reports hx COPD and sats normally run 88-90s.

## 2025-01-02 NOTE — ED Notes (Addendum)
 Pt placed on 4 L NC

## 2025-01-03 ENCOUNTER — Encounter: Payer: Self-pay | Admitting: Internal Medicine

## 2025-01-03 ENCOUNTER — Other Ambulatory Visit: Payer: Self-pay

## 2025-01-03 DIAGNOSIS — B370 Candidal stomatitis: Secondary | ICD-10-CM | POA: Insufficient documentation

## 2025-01-03 DIAGNOSIS — B369 Superficial mycosis, unspecified: Secondary | ICD-10-CM | POA: Insufficient documentation

## 2025-01-03 DIAGNOSIS — G894 Chronic pain syndrome: Secondary | ICD-10-CM | POA: Insufficient documentation

## 2025-01-03 MED ORDER — AZITHROMYCIN 500 MG PO TABS
500.0000 mg | ORAL_TABLET | Freq: Every day | ORAL | Status: AC
  Administered 2025-01-03: 500 mg via ORAL
  Filled 2025-01-03: qty 1

## 2025-01-03 MED ORDER — DULOXETINE HCL 30 MG PO CPEP
60.0000 mg | ORAL_CAPSULE | Freq: Every morning | ORAL | Status: DC
  Administered 2025-01-03 – 2025-01-08 (×6): 60 mg via ORAL
  Filled 2025-01-03 (×4): qty 2
  Filled 2025-01-03: qty 1
  Filled 2025-01-03: qty 2

## 2025-01-03 MED ORDER — PREDNISONE 20 MG PO TABS
40.0000 mg | ORAL_TABLET | Freq: Every day | ORAL | Status: DC
  Administered 2025-01-04: 40 mg via ORAL
  Filled 2025-01-03: qty 2

## 2025-01-03 MED ORDER — TIZANIDINE HCL 4 MG PO TABS
4.0000 mg | ORAL_TABLET | Freq: Three times a day (TID) | ORAL | Status: DC | PRN
  Filled 2025-01-03: qty 1

## 2025-01-03 MED ORDER — FLUCONAZOLE 100 MG PO TABS
200.0000 mg | ORAL_TABLET | Freq: Once | ORAL | Status: AC
  Administered 2025-01-03: 200 mg via ORAL
  Filled 2025-01-03: qty 2

## 2025-01-03 MED ORDER — ACETAMINOPHEN 650 MG RE SUPP: 650.0000 mg | Freq: Four times a day (QID) | RECTAL | Status: DC | PRN

## 2025-01-03 MED ORDER — METHYLPREDNISOLONE SODIUM SUCC 40 MG IJ SOLR
40.0000 mg | Freq: Two times a day (BID) | INTRAMUSCULAR | Status: AC
  Administered 2025-01-03 (×2): 40 mg via INTRAVENOUS
  Filled 2025-01-03 (×2): qty 1

## 2025-01-03 MED ORDER — ONDANSETRON HCL 4 MG/2ML IJ SOLN: 4.0000 mg | Freq: Four times a day (QID) | INTRAMUSCULAR | Status: DC | PRN

## 2025-01-03 MED ORDER — ENOXAPARIN SODIUM 60 MG/0.6ML IJ SOSY
0.5000 mg/kg | PREFILLED_SYRINGE | INTRAMUSCULAR | Status: DC
  Administered 2025-01-03 – 2025-01-06 (×4): 42.5 mg via SUBCUTANEOUS
  Filled 2025-01-03 (×5): qty 0.6

## 2025-01-03 MED ORDER — HYDROCODONE-ACETAMINOPHEN 5-325 MG PO TABS
1.0000 | ORAL_TABLET | ORAL | Status: DC | PRN
  Administered 2025-01-03 – 2025-01-07 (×10): 2 via ORAL
  Filled 2025-01-03 (×10): qty 2

## 2025-01-03 MED ORDER — ONDANSETRON HCL 4 MG PO TABS: 4.0000 mg | ORAL_TABLET | Freq: Four times a day (QID) | ORAL | Status: DC | PRN

## 2025-01-03 MED ORDER — ALBUTEROL SULFATE (2.5 MG/3ML) 0.083% IN NEBU
2.5000 mg | INHALATION_SOLUTION | RESPIRATORY_TRACT | Status: DC | PRN
  Administered 2025-01-03: 2.5 mg via RESPIRATORY_TRACT
  Filled 2025-01-03: qty 3

## 2025-01-03 MED ORDER — ACETAMINOPHEN 325 MG PO TABS
650.0000 mg | ORAL_TABLET | Freq: Four times a day (QID) | ORAL | Status: DC | PRN
  Administered 2025-01-03: 650 mg via ORAL
  Filled 2025-01-03: qty 2

## 2025-01-03 MED ORDER — SPIRONOLACTONE 25 MG PO TABS
25.0000 mg | ORAL_TABLET | Freq: Every day | ORAL | Status: DC
  Administered 2025-01-03 – 2025-01-07 (×5): 25 mg via ORAL
  Filled 2025-01-03 (×5): qty 1

## 2025-01-03 MED ORDER — PANTOPRAZOLE SODIUM 40 MG PO TBEC
40.0000 mg | DELAYED_RELEASE_TABLET | Freq: Every day | ORAL | Status: DC
  Administered 2025-01-03 – 2025-01-08 (×6): 40 mg via ORAL
  Filled 2025-01-03 (×6): qty 1

## 2025-01-03 MED ORDER — FLUCONAZOLE 100 MG PO TABS
100.0000 mg | ORAL_TABLET | Freq: Every day | ORAL | Status: DC
  Administered 2025-01-04 – 2025-01-08 (×5): 100 mg via ORAL
  Filled 2025-01-03 (×5): qty 1

## 2025-01-03 MED ORDER — AZITHROMYCIN 500 MG PO TABS
250.0000 mg | ORAL_TABLET | Freq: Every day | ORAL | Status: AC
  Administered 2025-01-04 – 2025-01-07 (×4): 250 mg via ORAL
  Filled 2025-01-03 (×4): qty 1

## 2025-01-03 MED ORDER — IPRATROPIUM-ALBUTEROL 0.5-2.5 (3) MG/3ML IN SOLN
3.0000 mL | Freq: Four times a day (QID) | RESPIRATORY_TRACT | Status: DC
  Administered 2025-01-03 – 2025-01-04 (×6): 3 mL via RESPIRATORY_TRACT
  Filled 2025-01-03 (×6): qty 3

## 2025-01-03 MED ORDER — GUAIFENESIN-DM 100-10 MG/5ML PO SYRP: 10.0000 mL | ORAL_SOLUTION | ORAL | Status: DC | PRN

## 2025-01-03 MED ORDER — GUAIFENESIN ER 600 MG PO TB12
600.0000 mg | ORAL_TABLET | Freq: Two times a day (BID) | ORAL | Status: DC
  Administered 2025-01-03 – 2025-01-08 (×12): 600 mg via ORAL
  Filled 2025-01-03 (×12): qty 1

## 2025-01-03 MED ORDER — CLOTRIMAZOLE 1 % EX CREA
1.0000 | TOPICAL_CREAM | Freq: Two times a day (BID) | CUTANEOUS | Status: DC
  Administered 2025-01-03 – 2025-01-08 (×9): 1 via TOPICAL
  Filled 2025-01-03: qty 15

## 2025-01-03 MED ORDER — AMLODIPINE BESYLATE 5 MG PO TABS
5.0000 mg | ORAL_TABLET | Freq: Every day | ORAL | Status: DC
  Administered 2025-01-03 – 2025-01-06 (×4): 5 mg via ORAL
  Filled 2025-01-03 (×5): qty 1

## 2025-01-03 MED ORDER — ASPIRIN 81 MG PO TBEC
81.0000 mg | DELAYED_RELEASE_TABLET | Freq: Every day | ORAL | Status: DC
  Administered 2025-01-03 – 2025-01-08 (×6): 81 mg via ORAL
  Filled 2025-01-03 (×6): qty 1

## 2025-01-03 MED ORDER — NICOTINE 14 MG/24HR TD PT24
14.0000 mg | MEDICATED_PATCH | Freq: Every day | TRANSDERMAL | Status: DC
  Administered 2025-01-03 – 2025-01-08 (×6): 14 mg via TRANSDERMAL
  Filled 2025-01-03 (×6): qty 1

## 2025-01-03 MED ORDER — AMMONIUM LACTATE 12 % EX LOTN
1.0000 | TOPICAL_LOTION | Freq: Two times a day (BID) | CUTANEOUS | Status: DC
  Administered 2025-01-03 – 2025-01-08 (×9): 1 via TOPICAL
  Filled 2025-01-03: qty 400

## 2025-01-03 NOTE — ED Notes (Signed)
 Pt set up for lunch

## 2025-01-03 NOTE — ED Notes (Signed)
 This RN called dietary to see status of tra

## 2025-01-03 NOTE — Hospital Course (Addendum)
 SABRA

## 2025-01-03 NOTE — Plan of Care (Signed)
 " Problem: Education: Goal: Knowledge of General Education information will improve Description: Including pain rating scale, medication(s)/side effects and non-pharmacologic comfort measures 01/03/2025 1611 by Jesus Allean HERO, RN Outcome: Progressing 01/03/2025 1611 by Jesus Allean HERO, RN Outcome: Progressing   Problem: Health Behavior/Discharge Planning: Goal: Ability to manage health-related needs will improve 01/03/2025 1611 by Jesus Allean HERO, RN Outcome: Progressing 01/03/2025 1611 by Jesus Allean HERO, RN Outcome: Progressing   Problem: Clinical Measurements: Goal: Ability to maintain clinical measurements within normal limits will improve 01/03/2025 1611 by Jesus Allean HERO, RN Outcome: Progressing 01/03/2025 1611 by Jesus Allean HERO, RN Outcome: Progressing Goal: Will remain free from infection 01/03/2025 1611 by Jesus Allean HERO, RN Outcome: Progressing 01/03/2025 1611 by Jesus Allean HERO, RN Outcome: Progressing Goal: Diagnostic test results will improve 01/03/2025 1611 by Jesus Allean HERO, RN Outcome: Progressing 01/03/2025 1611 by Jesus Allean HERO, RN Outcome: Progressing Goal: Respiratory complications will improve 01/03/2025 1611 by Jesus Allean HERO, RN Outcome: Progressing 01/03/2025 1611 by Jesus Allean HERO, RN Outcome: Progressing Goal: Cardiovascular complication will be avoided 01/03/2025 1611 by Jesus Allean HERO, RN Outcome: Progressing 01/03/2025 1611 by Jesus Allean HERO, RN Outcome: Progressing   Problem: Activity: Goal: Risk for activity intolerance will decrease 01/03/2025 1611 by Jesus Allean HERO, RN Outcome: Progressing 01/03/2025 1611 by Jesus Allean HERO, RN Outcome: Progressing   Problem: Nutrition: Goal: Adequate nutrition will be maintained 01/03/2025 1611 by Jesus Allean HERO, RN Outcome: Progressing 01/03/2025 1611 by Jesus Allean HERO, RN Outcome: Progressing   Problem: Coping: Goal: Level  of anxiety will decrease 01/03/2025 1611 by Jesus Allean HERO, RN Outcome: Progressing 01/03/2025 1611 by Jesus Allean HERO, RN Outcome: Progressing   Problem: Elimination: Goal: Will not experience complications related to bowel motility 01/03/2025 1611 by Jesus Allean HERO, RN Outcome: Progressing 01/03/2025 1611 by Jesus Allean HERO, RN Outcome: Progressing Goal: Will not experience complications related to urinary retention 01/03/2025 1611 by Jesus Allean HERO, RN Outcome: Progressing 01/03/2025 1611 by Jesus Allean HERO, RN Outcome: Progressing   Problem: Pain Managment: Goal: General experience of comfort will improve and/or be controlled 01/03/2025 1611 by Jesus Allean HERO, RN Outcome: Progressing 01/03/2025 1611 by Jesus Allean HERO, RN Outcome: Progressing   Problem: Safety: Goal: Ability to remain free from injury will improve 01/03/2025 1611 by Jesus Allean HERO, RN Outcome: Progressing 01/03/2025 1611 by Jesus Allean HERO, RN Outcome: Progressing   Problem: Skin Integrity: Goal: Risk for impaired skin integrity will decrease 01/03/2025 1611 by Jesus Allean HERO, RN Outcome: Progressing 01/03/2025 1611 by Jesus Allean HERO, RN Outcome: Progressing   Problem: Education: Goal: Knowledge of disease or condition will improve 01/03/2025 1611 by Jesus Allean HERO, RN Outcome: Progressing 01/03/2025 1611 by Jesus Allean HERO, RN Outcome: Progressing Goal: Knowledge of the prescribed therapeutic regimen will improve 01/03/2025 1611 by Jesus Allean HERO, RN Outcome: Progressing 01/03/2025 1611 by Jesus Allean HERO, RN Outcome: Progressing Goal: Individualized Educational Video(s) 01/03/2025 1611 by Jesus Allean HERO, RN Outcome: Progressing 01/03/2025 1611 by Jesus Allean HERO, RN Outcome: Progressing   Problem: Activity: Goal: Ability to tolerate increased activity will improve 01/03/2025 1611 by Jesus Allean HERO, RN Outcome:  Progressing 01/03/2025 1611 by Jesus Allean HERO, RN Outcome: Progressing Goal: Will verbalize the importance of balancing activity with adequate rest periods 01/03/2025 1611 by Jesus Allean HERO, RN Outcome: Progressing 01/03/2025 1611 by Jesus Allean HERO, RN Outcome: Progressing   Problem: Respiratory: Goal: Ability to maintain a clear airway will improve 01/03/2025 1611 by Jesus Allean HERO, RN  Outcome: Progressing 01/03/2025 1611 by Jesus Allean HERO, RN Outcome: Progressing Goal: Levels of oxygenation will improve 01/03/2025 1611 by Jesus Allean HERO, RN Outcome: Progressing 01/03/2025 1611 by Jesus Allean HERO, RN Outcome: Progressing Goal: Ability to maintain adequate ventilation will improve 01/03/2025 1611 by Jesus Allean HERO, RN Outcome: Progressing 01/03/2025 1611 by Jesus Allean HERO, RN Outcome: Progressing   "

## 2025-01-03 NOTE — Progress Notes (Signed)
 Mobility Specialist - Progress Note   01/03/25 1700  Mobility  Activity Ambulated independently;Stood with assistance  Level of Assistance Independent after set-up  Assistive Device None  Distance Ambulated (ft) 15 ft  Range of Motion/Exercises Active  Activity Response Tolerated fair  Mobility visit 1 Mobility  Mobility Specialist Start Time (ACUTE ONLY) 1609  Mobility Specialist Stop Time (ACUTE ONLY) 1616  Mobility Specialist Time Calculation (min) (ACUTE ONLY) 7 min   Pt was at the EOB on O2 @ 3 L upon entry. Pt agreed to mobility. Pt is able to STS independently with no AD. Pt ambulated in the room well but was fatigue quickly. After activity pt is back in bed with needs in reach.  Gloria Jones Mobility Specialist 01/03/25, 5:40 PM

## 2025-01-03 NOTE — Assessment & Plan Note (Addendum)
 Continue low carbohydrate diet

## 2025-01-03 NOTE — Assessment & Plan Note (Addendum)
 Improving on the tongue.  Continue Diflucan

## 2025-01-03 NOTE — Progress Notes (Signed)
 " Progress Note   Patient: Gloria Jones FMW:[id number] DOB: 20-Oct-1961 DOA: 01/02/2025     0 DOS: the patient was seen and examined on 01/03/2025   Brief hospital course: 64 y.o. female with medical history significant for COPD, asthma, essential hypertension, prediabetes, chronic respiratory failure on 2 L of oxygen, history of kidney stones, being admitted with COPD exacerbation with increased O2 requirement to 3 L.  She presented with several day history of cough productive of green phlegm, generalized bodyaches and O2 sat in the mid 80s on her 2 L.  Denies fever. In the ED, temp 99.4 with pulse 85 BP 131/80, requiring 3 L to maintain sats at 92%. Labs notable for WBC of 11.  Serum bicarb 36.  Respiratory viral panel negative.   EKG showed NSR at 85 Chest x-ray showing vascular congestion without significant interstitial edema.  No infiltrate or sizable effusion Patient treated with DuoNebs and Solu-Medrol  Admission requested.   1/15.  Patient still short of breath.  Still with cough greenish phlegm.  Complains of bodyaches.  Assessment and Plan: * COPD with acute exacerbation (HCC) Acute on chronic respiratory failure with hypoxia Patient presented with shortness of breath, wheezing reported O2 sat in the mid 80s on her baseline O2 on 2 L, here requiring 3 L to maintain sats 92% Continue nebulizers.  IV Solu-Medrol  today.  Try to taper oxygen down to her usual 2 L.   Fungal skin infection Will give Lac-Hydrin  lotion and antifungal cream.  Thrush Will give Diflucan   Chronic pain syndrome Continue duloxetine  and tizanidine   Pre-diabetes Continue carbohydrate diet  Hypertension Continue amlodipine  and spironolactone         Subjective: Patient still feeling short of breath still with cough with greenish phlegm.  Had bodyaches.  At home was short of breath and had decreased oxygen saturations.  Physical Exam: Vitals:   01/03/25 0926 01/03/25 1116 01/03/25 1345 01/03/25  1421  BP: (!) 130/93  128/85 121/74  Pulse:   88 79  Resp:   20 20  Temp:  97.7 F (36.5 C) 97.7 F (36.5 C) 99.1 F (37.3 C)  TempSrc:  Oral Oral Oral  SpO2:   95% 90%  Weight:      Height:       Physical Exam HENT:     Head: Normocephalic.     Mouth/Throat:     Pharynx: No oropharyngeal exudate.  Eyes:     General: Lids are normal.     Conjunctiva/sclera: Conjunctivae normal.  Cardiovascular:     Rate and Rhythm: Normal rate and regular rhythm.     Heart sounds: Normal heart sounds, S1 normal and S2 normal.  Pulmonary:     Breath sounds: Examination of the right-lower field reveals decreased breath sounds and rhonchi. Examination of the left-lower field reveals decreased breath sounds and rhonchi. Decreased breath sounds and rhonchi present. No wheezing or rales.  Abdominal:     Palpations: Abdomen is soft.     Tenderness: There is no abdominal tenderness.  Musculoskeletal:     Right lower leg: No swelling.     Left lower leg: No swelling.  Skin:    General: Skin is warm.     Comments: Slight redness and scaling on feet.  Neurological:     Mental Status: She is alert and oriented to person, place, and time.     Data Reviewed: Creatinine 0.54, CO2 36, white blood count 11.0, hemoglobin 16.8, platelet count 212, MCV 102.6  Disposition: Status is: Observation  Continue IV Solu-Medrol  today.  Reassess tomorrow.  Planned Discharge Destination: Home    Time spent: 28 minutes  Author: Charlie Patterson, MD 01/03/2025 3:03 PM  For on call review [email or web address].  "

## 2025-01-03 NOTE — ED Notes (Signed)
 Breakfast tray disposed of. 100% consumed. Spoon provided per request. Warm blanket given. No additional needs expressed at this time.

## 2025-01-03 NOTE — Assessment & Plan Note (Addendum)
 Continue Lac-Hydrin  lotion and antifungal cream.

## 2025-01-03 NOTE — Assessment & Plan Note (Addendum)
 Continue amlodipine  at a higher dose.  Discontinue Aldactone  with hyperkalemia

## 2025-01-03 NOTE — Assessment & Plan Note (Addendum)
 Patient presented with shortness of breath, wheezing reported O2 sat in the mid 80s on her baseline O2 on 2 L, initially requiring 3 L to maintain sats 92%. Continue nebulizers.  Increase Solu-Medrol  up to 80 mg daily.  Continue Trelegy inhaler.  Will give a trial of BiPAP at night with pCO2 being elevated.

## 2025-01-03 NOTE — Hospital Course (Addendum)
 64 y.o. female with medical history significant for COPD, asthma, essential hypertension, prediabetes, chronic respiratory failure on 2 L of oxygen, history of kidney stones, being admitted with COPD exacerbation with increased O2 requirement to 3 L.  She presented with several day history of cough productive of green phlegm, generalized bodyaches and O2 sat in the mid 80s on her 2 L.  Denies fever. In the ED, temp 99.4 with pulse 85 BP 131/80, requiring 3 L to maintain sats at 92%. Labs notable for WBC of 11.  Serum bicarb 36.  Respiratory viral panel negative.   EKG showed NSR at 85 Chest x-ray showing vascular congestion without significant interstitial edema.  No infiltrate or sizable effusion Patient treated with DuoNebs and Solu-Medrol  Admission requested.   1/15.  Patient still short of breath.  Still with cough greenish phlegm.  Complains of bodyaches. 1/16.  Patient with more bronchospasm today.  Switch prednisone  back to Solu-Medrol . 1/17.  Still feeling tight.  Continue to monitor with bronchospasm today. 1/18.  Still feeling tight. 1/19.  Venous blood gas showing a pCO2 of 89.  Will give a trial of BiPAP at night and see if we can set up noninvasive ventilation.  Hemoglobin up at 19.  Set up a therapeutic phlebotomy.  JAK2 and erythropoietin  level pending.  Increase Solu-Medrol  to 80 mg daily. 1/20.  Set up for noninvasive ventilation at night with trilogy machine.  Patient feeling better and wants to go home.  Potassium 5.7 this morning.  Aldactone  discontinued and a dose of Lokelma  given.  Repeat potassium 5.3 will give another dose of Lokelma  upon going home.  Needs a repeat check of potassium with follow-up appointment.

## 2025-01-03 NOTE — Assessment & Plan Note (Signed)
 Continue duloxetine  and tizanidine 

## 2025-01-03 NOTE — H&P (Signed)
 " History and Physical    Patient: Gloria Jones FMW:[id number] DOB: [birth year] DOA: 01/02/2025 DOS: the patient was seen and examined on 01/03/2025 PCP: [facility]  Patient coming from: Home  Chief Complaint:  Chief Complaint  Patient presents with   Shortness of Breath    HPI: Gloria Jones is a 64 y.o. female with medical history significant for COPD, asthma, essential hypertension, prediabetes, chronic respiratory failure on 2 L of oxygen, history of kidney stones, being admitted with COPD exacerbation with increased O2 requirement to 3 L.  She presented with several day history of cough productive of green phlegm, generalized bodyaches and O2 sat in the mid 80s on her 2 L.  Denies fever. In the ED, temp 99.4 with pulse 85 BP 131/80, requiring 3 L to maintain sats at 92%. Labs notable for WBC of 11.  Serum bicarb 36.  Respiratory viral panel negative.  EKG showed NSR at 85 Chest x-ray showing vascular congestion without significant interstitial edema.  No infiltrate or sizable effusion Patient treated with DuoNebs and Solu-Medrol  Admission requested.     Past Medical History:  Diagnosis Date   Allergic state    Aortic atherosclerosis    Asthma    Bronchitis    Chronic pain syndrome    COPD (chronic obstructive pulmonary disease) (HCC)    Coronary artery disease    DDD (degenerative disc disease), cervical    Dyspnea    Fibromyalgia    History of kidney stones    Hypertension    Iridodialysis of right eye    Mixed hyperlipidemia    Oxygen desaturation    Paraseptal emphysema (HCC)    Pre-diabetes    Pulmonary nodules    Respiratory failure (HCC)    chronic o2 2liters   Spondylosis of lumbar region without myelopathy or radiculopathy    Past Surgical History:  Procedure Laterality Date   CATARACT EXTRACTION Bilateral    COLONOSCOPY N/A 10/05/2024   Procedure: COLONOSCOPY;  Surgeon: Maryruth Ole DASEN, MD;  Location: [facility] ENDOSCOPY;  Service:  Endoscopy;  Laterality: N/A;   COLONOSCOPY WITH PROPOFOL  N/A 06/28/2017   Procedure: COLONOSCOPY WITH PROPOFOL ;  Surgeon: Gaylyn Gladis PENNER, MD;  Location: [facility] ENDOSCOPY;  Service: Endoscopy;  Laterality: N/A;   COLONOSCOPY WITH PROPOFOL  N/A 03/25/2023   Procedure: COLONOSCOPY WITH PROPOFOL ;  Surgeon: Maryruth Ole DASEN, MD;  Location: [facility] ENDOSCOPY;  Service: Endoscopy;  Laterality: N/A;  DM   COLONOSCOPY WITH PROPOFOL  N/A 07/11/2023   Procedure: COLONOSCOPY WITH PROPOFOL ;  Surgeon: Maryruth Ole DASEN, MD;  Location: [facility] ENDOSCOPY;  Service: Endoscopy;  Laterality: N/A;   CYSTOSCOPY/URETEROSCOPY/HOLMIUM LASER/STENT PLACEMENT Right 07/30/2022   Procedure: CYSTOSCOPY/URETEROSCOPY/HOLMIUM LASER/STENT PLACEMENT;  Surgeon: Francisca Redell BROCKS, MD;  Location: [facility] ORS;  Service: Urology;  Laterality: Right;   ESOPHAGOGASTRODUODENOSCOPY (EGD) WITH PROPOFOL  N/A 06/28/2017   Procedure: ESOPHAGOGASTRODUODENOSCOPY (EGD) WITH PROPOFOL ;  Surgeon: Gaylyn Gladis PENNER, MD;  Location: [facility] ENDOSCOPY;  Service: Endoscopy;  Laterality: N/A;   EYE SURGERY     KNEE SURGERY Left    meniscus repair   OVARY SURGERY Left 12/1977   partial ovary and fallopian tube removed   POLYPECTOMY  07/11/2023   Procedure: POLYPECTOMY;  Surgeon: Maryruth Ole DASEN, MD;  Location: [facility] ENDOSCOPY;  Service: Endoscopy;;   POLYPECTOMY  10/05/2024   Procedure: POLYPECTOMY, INTESTINE;  Surgeon: Maryruth Ole DASEN, MD;  Location: [facility] ENDOSCOPY;  Service: Endoscopy;;   REPAIR IRIS & CILIARY BODY Right    Social History:  reports that she has been smoking  cigarettes. She has been exposed to tobacco smoke. She has never used smokeless tobacco. She reports that she does not currently use alcohol. She reports current drug use. Drug: Marijuana.  Allergies[1]  Family History  Problem Relation Age of Onset   Hypertension Mother    Colon cancer Maternal Uncle    Breast cancer Maternal Aunt        mat great aunt    Prior to  Admission medications  Medication Sig Start Date End Date Taking? Authorizing Provider  albuterol  (PROVENTIL  HFA;VENTOLIN  HFA) 108 (90 BASE) MCG/ACT inhaler Inhale 1-2 puffs into the lungs every 4 (four) hours as needed for wheezing or shortness of breath. 05/24/15   Betancourt, Ellouise LABOR, NP  albuterol  (PROVENTIL ) (2.5 MG/3ML) 0.083% nebulizer solution Take 3 mLs (2.5 mg total) by nebulization every 4 (four) hours as needed for wheezing or shortness of breath. 04/26/23 08/25/24  Angelena Smalls, MD  amLODipine  (NORVASC ) 5 MG tablet Take 1 tablet (5 mg total) by mouth daily. 08/28/24 08/28/25  Von Bellis, MD  aspirin  EC 81 MG tablet Take 81 mg by mouth daily. Swallow whole.    [provider]  DULoxetine  (CYMBALTA ) 60 MG capsule Take 60 mg by mouth every morning.    [provider]  fluticasone  (FLONASE ) 50 MCG/ACT nasal spray Place 2 sprays into both nostrils daily. 12/25/24   Van Knee, MD  Fluticasone -Umeclidin-Vilant (TRELEGY ELLIPTA ) 100-62.5-25 MCG/ACT AEPB Inhale 1 puff into the lungs daily. Take as directed - one dose every 24 hours for COPD. 08/27/24   Von Bellis, MD  ipratropium-albuterol  (DUONEB) 0.5-2.5 (3) MG/3ML SOLN Take 3 mLs by nebulization every 6 (six) hours as needed. Patient not taking: Reported on 08/25/2024 12/19/22   [place], Camellia PARAS, DO  methylPREDNISolone  (MEDROL  DOSEPAK) 4 MG TBPK tablet Take by mouth daily. Follow package instructions 12/25/24   Van Knee, MD  OXYGEN Inhale 2 L/min into the lungs as needed (DYSPNEA).    [provider]  pantoprazole  (PROTONIX ) 40 MG tablet Take 1 tablet (40 mg total) by mouth daily for 10 days. 08/28/24 09/07/24  Von Bellis, MD  spironolactone  (ALDACTONE ) 25 MG tablet Take 25 mg by mouth daily. 12/27/18   [provider]  tiZANidine  (ZANAFLEX ) 4 MG tablet Take 1 tablet (4 mg total) by mouth every 8 (eight) hours as needed for muscle spasms. 12/25/24   Van Knee, MD    Physical Exam: Vitals:    01/02/25 2000 01/02/25 2001 01/02/25 2002 01/03/25 0009  BP:  131/80    Pulse:  85    Resp:   20   Temp:  99.4 F (37.4 C)  (!) 97 F (36.1 C)  TempSrc:    Axillary  SpO2:  92%    Weight: 86.2 kg     Height: 5' 3 (1.6 m)      Physical Exam Vitals and nursing note reviewed.  Constitutional:      General: She is not in acute distress. HENT:     Head: Normocephalic and atraumatic.  Cardiovascular:     Rate and Rhythm: Normal rate and regular rhythm.     Heart sounds: Normal heart sounds.  Pulmonary:     Breath sounds: Wheezing and rhonchi present.  Abdominal:     Palpations: Abdomen is soft.     Tenderness: There is no abdominal tenderness.  Neurological:     Mental Status: Mental status is at baseline.     Labs on Admission: I have personally reviewed following labs and imaging studies  CBC: Recent Labs  Lab 01/02/25 2003  WBC 11.0*  NEUTROABS 7.3  HGB 16.8*  HCT 55.2*  MCV 102.6*  PLT 212   Basic Metabolic Panel: Recent Labs  Lab 01/02/25 2003  NA 139  K 4.6  CL 97*  CO2 36*  GLUCOSE 131*  BUN 14  CREATININE 0.54  CALCIUM 8.9   GFR: Estimated Creatinine Clearance: 73.9 mL/min (by C-G formula based on SCr of 0.54 mg/dL). Liver Function Tests: No results for input(s): AST, ALT, ALKPHOS, BILITOT, PROT, ALBUMIN in the last 168 hours. No results for input(s): LIPASE, AMYLASE in the last 168 hours. No results for input(s): AMMONIA in the last 168 hours. Coagulation Profile: No results for input(s): INR, PROTIME in the last 168 hours. Cardiac Enzymes: No results for input(s): CKTOTAL, CKMB, CKMBINDEX, TROPONINI in the last 168 hours. BNP (last 3 results) No results for input(s): PROBNP in the last 8760 hours. HbA1C: No results for input(s): HGBA1C in the last 72 hours. CBG: No results for input(s): GLUCAP in the last 168 hours. Lipid Profile: No results for input(s): CHOL, HDL, LDLCALC, TRIG, CHOLHDL,  LDLDIRECT in the last 72 hours. Thyroid Function Tests: No results for input(s): TSH, T4TOTAL, FREET4, T3FREE, THYROIDAB in the last 72 hours. Anemia Panel: No results for input(s): VITAMINB12, FOLATE, FERRITIN, TIBC, IRON, RETICCTPCT in the last 72 hours. Urine analysis:    Component Value Date/Time   COLORURINE YELLOW 07/13/2024 1628   APPEARANCEUR CLEAR 07/13/2024 1628   APPEARANCEUR CLEAR 09/29/2014 1514   LABSPEC >1.030 (H) 07/13/2024 1628   LABSPEC 1.020 09/29/2014 1514   PHURINE 7.0 07/13/2024 1628   GLUCOSEU NEGATIVE 07/13/2024 1628   GLUCOSEU NEGATIVE 09/29/2014 1514   HGBUR NEGATIVE 07/13/2024 1628   BILIRUBINUR negative 12/25/2024 1904   BILIRUBINUR NEGATIVE 09/29/2014 1514   KETONESUR negative 12/25/2024 1904   KETONESUR NEGATIVE 07/13/2024 1628   PROTEINUR =30 (A) 12/25/2024 1904   PROTEINUR >300 (A) 07/13/2024 1628   UROBILINOGEN 1.0 12/25/2024 1904   NITRITE Negative 12/25/2024 1904   NITRITE NEGATIVE 07/13/2024 1628   LEUKOCYTESUR Negative 12/25/2024 1904   LEUKOCYTESUR NEGATIVE 07/13/2024 1628   LEUKOCYTESUR NEGATIVE 09/29/2014 1514    Radiological Exams on Admission: DG Chest 2 View Result Date: 01/02/2025 EXAM: 2 VIEW(S) XRAY OF THE CHEST 01/02/2025 08:21:39 PM COMPARISON: Comparison study 07/13/2024, 08/25/2024. CLINICAL HISTORY: Shortness of breath. FINDINGS: LUNGS AND PLEURA: Vascular congestion is noted without significant interstitial edema. No focal confluent infiltrate or sizable effusion is seen. No pneumothorax. HEART AND MEDIASTINUM: Cardiac shadow is enlarged. BONES AND SOFT TISSUES: No bony abnormality is noted. IMPRESSION: 1. Enlarged cardiac shadow and vascular congestion without significant interstitial edema. 2. No focal confluent infiltrate or sizable effusion. Electronically signed by: Oneil Devonshire MD 01/02/2025 08:26 PM EST RP Workstation: [id number]   Data Reviewed for HPI: Relevant notes from primary care and  specialist visits, past discharge summaries as available in EHR, including Care Everywhere. Prior diagnostic testing as pertinent to current admission diagnoses Updated medications and problem lists for reconciliation ED course, including vitals, labs, imaging, treatment and response to treatment Triage notes, nursing and pharmacy notes and ED provider's notes Notable results as noted above in HPI      Assessment and Plan: * COPD with acute exacerbation (HCC) Acute on chronic respiratory failure with hypoxia Patient presented with shortness of breath, wheezing reported O2 sat in the mid 80s on her baseline O2 on 2 L, here requiring 3 L to maintain sats 92% Schedule and as needed nebulized bronchodilators  IV steroids, antitussives, flutter valve Continue supplemental oxygen and wean to baseline requirement as tolerated  Chronic pain syndrome Continue duloxetine  and tizanidine   Pre-diabetes Consistent carbohydrate diet  Hypertension Continue amlodipine  and spironolactone     DVT prophylaxis: Lovenox   Consults: none  Advance Care Planning:   Code Status: Full Code   Family Communication: none  Disposition Plan: Back to previous home environment  Severity of Illness: The appropriate patient status for this patient is OBSERVATION. Observation status is judged to be reasonable and necessary in order to provide the required intensity of service to ensure the patient's safety. The patient's presenting symptoms, physical exam findings, and initial radiographic and laboratory data in the context of their medical condition is felt to place them at decreased risk for further clinical deterioration. Furthermore, it is anticipated that the patient will be medically stable for discharge from the hospital within 2 midnights of admission.   Author: Delayne LULLA Solian, MD 01/03/2025 3:27 AM  For on call review [email or web address].     [1]  Allergies Allergen Reactions   Simvastatin     Other  reaction(s): Muscle Pain   Statins     Other reaction(s): Other (See Comments) arthralgia   Fish Allergy    Lisinopril     Other reaction(s): Cough   Losartan     Other reaction(s): Headache   Pravastatin Other (See Comments)   "

## 2025-01-03 NOTE — TOC CM/SW Note (Signed)
 Transition of Care [facility]) - Inpatient Brief Assessment   Patient Details  Name: Gloria Jones MRN: [id number] Date of Birth: Mar 14, 1961  Transition of Care [facility]) CM/SW Contact:    Nathanael CHRISTELLA Ring, RN Phone Number: 01/03/2025, 4:23 PM   Clinical Narrative:  Transition of Care [facility]) Screening Note   Patient Details  Name: Gloria Jones Date of Birth: 01/27/1961   Transition of Care [facility]) CM/SW Contact:    Nathanael CHRISTELLA Ring, RN Phone Number: 01/03/2025, 4:23 PM    Transition of Care Department [facility]) has reviewed patient and no TOC needs have been identified at this time.  If new patient transition needs arise, please place a TOC consult.     Transition of Care Asessment: Insurance and Status: Insurance coverage has been reviewed Patient has primary care physician: Yes Home environment has been reviewed: From home Prior level of function:: independent Prior/Current Home Services: No current home services Social Drivers of Health Review: SDOH reviewed no interventions necessary Readmission risk has been reviewed: No (observation) Transition of care needs: no transition of care needs at this time

## 2025-01-03 NOTE — Progress Notes (Signed)
 PHARMACIST - PHYSICIAN COMMUNICATION  CONCERNING:  Enoxaparin  (Lovenox ) for DVT Prophylaxis    RECOMMENDATION: Patient was prescribed enoxaprin 40mg  q24 hours for VTE prophylaxis.   Filed Weights   01/02/25 2000  Weight: 86.2 kg (190 lb)    Body mass index is 33.66 kg/m.  Estimated Creatinine Clearance: 73.9 mL/min (by C-G formula based on SCr of 0.54 mg/dL).   Based on [facility] policy patient is candidate for enoxaparin  0.5mg /kg TBW SQ every 24 hours based on BMI being >30.  DESCRIPTION: Pharmacy has adjusted enoxaparin  dose per [facility] policy.  Patient is now receiving enoxaparin  42.5 mg every 24 hours    Damien Napoleon, PharmD Clinical Pharmacist  01/03/2025 12:16 AM

## 2025-01-04 DIAGNOSIS — J438 Other emphysema: Secondary | ICD-10-CM | POA: Diagnosis present

## 2025-01-04 DIAGNOSIS — Z888 Allergy status to other drugs, medicaments and biological substances status: Secondary | ICD-10-CM | POA: Diagnosis not present

## 2025-01-04 DIAGNOSIS — M797 Fibromyalgia: Secondary | ICD-10-CM | POA: Diagnosis present

## 2025-01-04 DIAGNOSIS — B369 Superficial mycosis, unspecified: Secondary | ICD-10-CM

## 2025-01-04 DIAGNOSIS — I10 Essential (primary) hypertension: Secondary | ICD-10-CM | POA: Diagnosis present

## 2025-01-04 DIAGNOSIS — B370 Candidal stomatitis: Secondary | ICD-10-CM | POA: Diagnosis present

## 2025-01-04 DIAGNOSIS — R7303 Prediabetes: Secondary | ICD-10-CM | POA: Diagnosis present

## 2025-01-04 DIAGNOSIS — J9621 Acute and chronic respiratory failure with hypoxia: Secondary | ICD-10-CM | POA: Diagnosis present

## 2025-01-04 DIAGNOSIS — Z91013 Allergy to seafood: Secondary | ICD-10-CM | POA: Diagnosis not present

## 2025-01-04 DIAGNOSIS — G894 Chronic pain syndrome: Secondary | ICD-10-CM | POA: Diagnosis present

## 2025-01-04 DIAGNOSIS — E782 Mixed hyperlipidemia: Secondary | ICD-10-CM | POA: Diagnosis present

## 2025-01-04 DIAGNOSIS — J441 Chronic obstructive pulmonary disease with (acute) exacerbation: Secondary | ICD-10-CM | POA: Diagnosis present

## 2025-01-04 DIAGNOSIS — I2510 Atherosclerotic heart disease of native coronary artery without angina pectoris: Secondary | ICD-10-CM | POA: Diagnosis present

## 2025-01-04 DIAGNOSIS — Z9981 Dependence on supplemental oxygen: Secondary | ICD-10-CM | POA: Diagnosis not present

## 2025-01-04 DIAGNOSIS — J9601 Acute respiratory failure with hypoxia: Secondary | ICD-10-CM | POA: Diagnosis not present

## 2025-01-04 DIAGNOSIS — E66811 Obesity, class 1: Secondary | ICD-10-CM | POA: Diagnosis present

## 2025-01-04 DIAGNOSIS — E669 Obesity, unspecified: Secondary | ICD-10-CM | POA: Diagnosis not present

## 2025-01-04 DIAGNOSIS — G4733 Obstructive sleep apnea (adult) (pediatric): Secondary | ICD-10-CM | POA: Diagnosis present

## 2025-01-04 DIAGNOSIS — D751 Secondary polycythemia: Secondary | ICD-10-CM | POA: Diagnosis present

## 2025-01-04 DIAGNOSIS — Z8249 Family history of ischemic heart disease and other diseases of the circulatory system: Secondary | ICD-10-CM | POA: Diagnosis not present

## 2025-01-04 DIAGNOSIS — F17210 Nicotine dependence, cigarettes, uncomplicated: Secondary | ICD-10-CM | POA: Diagnosis present

## 2025-01-04 DIAGNOSIS — Z6833 Body mass index (BMI) 33.0-33.9, adult: Secondary | ICD-10-CM | POA: Diagnosis not present

## 2025-01-04 DIAGNOSIS — J9602 Acute respiratory failure with hypercapnia: Secondary | ICD-10-CM | POA: Diagnosis not present

## 2025-01-04 DIAGNOSIS — E875 Hyperkalemia: Secondary | ICD-10-CM | POA: Diagnosis present

## 2025-01-04 DIAGNOSIS — J9622 Acute and chronic respiratory failure with hypercapnia: Secondary | ICD-10-CM | POA: Diagnosis present

## 2025-01-04 DIAGNOSIS — Z7982 Long term (current) use of aspirin: Secondary | ICD-10-CM | POA: Diagnosis not present

## 2025-01-04 DIAGNOSIS — Z1152 Encounter for screening for COVID-19: Secondary | ICD-10-CM | POA: Diagnosis not present

## 2025-01-04 MED ORDER — IPRATROPIUM-ALBUTEROL 0.5-2.5 (3) MG/3ML IN SOLN
3.0000 mL | Freq: Three times a day (TID) | RESPIRATORY_TRACT | Status: DC
  Administered 2025-01-05 (×3): 3 mL via RESPIRATORY_TRACT
  Filled 2025-01-04 (×3): qty 3

## 2025-01-04 MED ORDER — METHYLPREDNISOLONE SODIUM SUCC 40 MG IJ SOLR
40.0000 mg | Freq: Every day | INTRAMUSCULAR | Status: DC
  Administered 2025-01-04 – 2025-01-06 (×3): 40 mg via INTRAVENOUS
  Filled 2025-01-04 (×3): qty 1

## 2025-01-04 MED ORDER — POLYETHYLENE GLYCOL 3350 17 G PO PACK
17.0000 g | PACK | Freq: Every day | ORAL | Status: DC
  Administered 2025-01-04 – 2025-01-06 (×2): 17 g via ORAL
  Filled 2025-01-04 (×5): qty 1

## 2025-01-04 NOTE — Plan of Care (Signed)

## 2025-01-04 NOTE — Progress Notes (Signed)
 " Progress Note   Patient: Gloria Jones [id number] DOB: 08/19/1961 DOA: 01/02/2025     0 DOS: the patient was seen and examined on 01/04/2025   Brief hospital course: 64 y.o. female with medical history significant for COPD, asthma, essential hypertension, prediabetes, chronic respiratory failure on 2 L of oxygen, history of kidney stones, being admitted with COPD exacerbation with increased O2 requirement to 3 L.  She presented with several day history of cough productive of green phlegm, generalized bodyaches and O2 sat in the mid 80s on her 2 L.  Denies fever. In the ED, temp 99.4 with pulse 85 BP 131/80, requiring 3 L to maintain sats at 92%. Labs notable for WBC of 11.  Serum bicarb 36.  Respiratory viral panel negative.   EKG showed NSR at 85 Chest x-ray showing vascular congestion without significant interstitial edema.  No infiltrate or sizable effusion Patient treated with DuoNebs and Solu-Medrol  Admission requested.   1/15.  Patient still short of breath.  Still with cough greenish phlegm.  Complains of bodyaches. 1/16.  Patient with more bronchospasm today.  Switch prednisone  back to Solu-Medrol .  Assessment and Plan: * COPD with acute exacerbation (HCC) Acute on chronic respiratory failure with hypoxia Patient presented with shortness of breath, wheezing reported O2 sat in the mid 80s on her baseline O2 on 2 L, initially requiring 3 L to maintain sats 92% Continue nebulizers.  Switch prednisone  over to Solu-Medrol .  Patient with persistent bronchospasm.  Will need at least another day in the hospital.  Back to 2 L.  Fungal skin infection Will give Lac-Hydrin  lotion and antifungal cream.  Thrush Continue Diflucan   Chronic pain syndrome Continue duloxetine  and tizanidine   Pre-diabetes Continue low carbohydrate diet  Hypertension Continue amlodipine  and spironolactone         Subjective: Patient feels better but not back to baseline.  Still feeling tight.   Still with cough.  Greenish phlegm.  Physical Exam: Vitals:   01/03/25 2133 01/04/25 0152 01/04/25 0315 01/04/25 0810  BP: 134/75  122/78 134/83  Pulse: 77  71 69  Resp: 16  16 18   Temp: 98 F (36.7 C)  98.2 F (36.8 C) 98.4 F (36.9 C)  TempSrc:   Oral   SpO2: 94% 94% 96% 93%  Weight:      Height:       Physical Exam HENT:     Head: Normocephalic.     Mouth/Throat:     Comments: Thrush on tongue Eyes:     General: Lids are normal.     Conjunctiva/sclera: Conjunctivae normal.  Cardiovascular:     Rate and Rhythm: Normal rate and regular rhythm.     Heart sounds: Normal heart sounds, S1 normal and S2 normal.  Pulmonary:     Breath sounds: Examination of the right-middle field reveals wheezing. Examination of the left-middle field reveals wheezing. Examination of the right-lower field reveals decreased breath sounds and wheezing. Examination of the left-lower field reveals decreased breath sounds and wheezing. Decreased breath sounds and wheezing present. No rhonchi or rales.  Abdominal:     Palpations: Abdomen is soft.     Tenderness: There is no abdominal tenderness.  Musculoskeletal:     Right lower leg: No swelling.     Left lower leg: No swelling.  Skin:    General: Skin is warm.     Comments: Slight redness and scaling on feet.  Neurological:     Mental Status: She is alert and oriented to person, place, and  time.     Data Reviewed: No new data  Disposition: Status is: Observation Patient with bronchospasm today.  Switch prednisone  over to Solu-Medrol .  Continue to to evaluate on a day-to-day basis.  Planned Discharge Destination: Home    Time spent: 28 minutes Case discussed with nursing staff  Author: Charlie Patterson, MD 01/04/2025 10:47 AM  For on call review [email or web address].  "

## 2025-01-04 NOTE — Plan of Care (Signed)
" °  Problem: Activity: Goal: Risk for activity intolerance will decrease Outcome: Progressing   Problem: Pain Managment: Goal: General experience of comfort will improve and/or be controlled Outcome: Progressing   Problem: Safety: Goal: Ability to remain free from injury will improve Outcome: Progressing   Problem: Respiratory: Goal: Levels of oxygenation will improve Outcome: Progressing   "

## 2025-01-05 DIAGNOSIS — E669 Obesity, unspecified: Secondary | ICD-10-CM | POA: Insufficient documentation

## 2025-01-05 DIAGNOSIS — B369 Superficial mycosis, unspecified: Secondary | ICD-10-CM | POA: Diagnosis not present

## 2025-01-05 DIAGNOSIS — J441 Chronic obstructive pulmonary disease with (acute) exacerbation: Secondary | ICD-10-CM | POA: Diagnosis not present

## 2025-01-05 DIAGNOSIS — B370 Candidal stomatitis: Secondary | ICD-10-CM | POA: Diagnosis not present

## 2025-01-05 LAB — CBC
HCT: 55.4 % — ABNORMAL HIGH (ref 36.0–46.0)
Hemoglobin: 16.8 g/dL — ABNORMAL HIGH (ref 12.0–15.0)
MCH: 31 pg (ref 26.0–34.0)
MCHC: 30.3 g/dL (ref 30.0–36.0)
MCV: 102.2 fL — ABNORMAL HIGH (ref 80.0–100.0)
Platelets: 214 K/uL (ref 150–400)
RBC: 5.42 MIL/uL — ABNORMAL HIGH (ref 3.87–5.11)
RDW: 12.8 % (ref 11.5–15.5)
WBC: 13.6 K/uL — ABNORMAL HIGH (ref 4.0–10.5)
nRBC: 0 % (ref 0.0–0.2)

## 2025-01-05 NOTE — Plan of Care (Signed)

## 2025-01-05 NOTE — Assessment & Plan Note (Signed)
 Class I with a BMI of 33.66

## 2025-01-05 NOTE — Progress Notes (Signed)
 " Progress Note   Patient: Gloria Jones [id number] DOB: October 22, 1961 DOA: 01/02/2025     1 DOS: the patient was seen and examined on 01/05/2025   Brief hospital course: 64 y.o. female with medical history significant for COPD, asthma, essential hypertension, prediabetes, chronic respiratory failure on 2 L of oxygen, history of kidney stones, being admitted with COPD exacerbation with increased O2 requirement to 3 L.  She presented with several day history of cough productive of green phlegm, generalized bodyaches and O2 sat in the mid 80s on her 2 L.  Denies fever. In the ED, temp 99.4 with pulse 85 BP 131/80, requiring 3 L to maintain sats at 92%. Labs notable for WBC of 11.  Serum bicarb 36.  Respiratory viral panel negative.   EKG showed NSR at 85 Chest x-ray showing vascular congestion without significant interstitial edema.  No infiltrate or sizable effusion Patient treated with DuoNebs and Solu-Medrol  Admission requested.   1/15.  Patient still short of breath.  Still with cough greenish phlegm.  Complains of bodyaches. 1/16.  Patient with more bronchospasm today.  Switch prednisone  back to Solu-Medrol . 1/17.  Still feeling tight.  Continue to monitor with bronchospasm today.  Assessment and Plan: * COPD with acute exacerbation (HCC) Acute on chronic respiratory failure with hypoxia Patient presented with shortness of breath, wheezing reported O2 sat in the mid 80s on her baseline O2 on 2 L, initially requiring 3 L to maintain sats 92%. Continue nebulizers.  Continue Solu-Medrol  for bronchospasm.  Continue to monitor on a day-to-day basis.  Back to 2 L.  Obesity (BMI 30-39.9) Class I with a BMI of 33.66  Fungal skin infection Continue Lac-Hydrin  lotion and antifungal cream.  Thrush Continue Diflucan   Chronic pain syndrome Continue duloxetine  and tizanidine   Pre-diabetes Continue low carbohydrate diet  Hypertension Continue amlodipine  and spironolactone          Subjective: Patient still feeling tight.  Still having cough.  Still with shortness of breath.  Admitted with COPD exacerbation.  Physical Exam: Vitals:   01/04/25 1935 01/05/25 0330 01/05/25 0729 01/05/25 0828  BP: 127/69 (!) 152/86  122/80  Pulse: 77 73  84  Resp: 16 16    Temp: 97.8 F (36.6 C) 97.8 F (36.6 C)  (!) 97.5 F (36.4 C)  TempSrc: Oral Oral  Oral  SpO2: 93% 96% 98% 93%  Weight:      Height:       Physical Exam HENT:     Head: Normocephalic.     Mouth/Throat:     Comments: Thrush on tongue Eyes:     General: Lids are normal.     Conjunctiva/sclera: Conjunctivae normal.  Cardiovascular:     Rate and Rhythm: Normal rate and regular rhythm.     Heart sounds: Normal heart sounds, S1 normal and S2 normal.  Pulmonary:     Breath sounds: Examination of the right-lower field reveals decreased breath sounds and wheezing. Examination of the left-lower field reveals decreased breath sounds and wheezing. Decreased breath sounds and wheezing present. No rhonchi or rales.  Abdominal:     Palpations: Abdomen is soft.     Tenderness: There is no abdominal tenderness.  Musculoskeletal:     Right lower leg: No swelling.     Left lower leg: No swelling.  Skin:    General: Skin is warm.     Comments: Slight redness and scaling on feet.  Neurological:     Mental Status: She is alert and oriented to person,  place, and time.     Data Reviewed: No new data  Disposition: Status is: Inpatient Remains inpatient appropriate because: Continue Solu-Medrol  for COPD exacerbation  Planned Discharge Destination: Home    Time spent: 28 minutes  Author: Charlie Patterson, MD 01/05/2025 10:53 AM  For on call review [email or web address].  "

## 2025-01-05 NOTE — Plan of Care (Signed)

## 2025-01-06 DIAGNOSIS — G894 Chronic pain syndrome: Secondary | ICD-10-CM | POA: Diagnosis not present

## 2025-01-06 DIAGNOSIS — D751 Secondary polycythemia: Secondary | ICD-10-CM | POA: Insufficient documentation

## 2025-01-06 DIAGNOSIS — J441 Chronic obstructive pulmonary disease with (acute) exacerbation: Secondary | ICD-10-CM | POA: Diagnosis not present

## 2025-01-06 DIAGNOSIS — J9621 Acute and chronic respiratory failure with hypoxia: Secondary | ICD-10-CM | POA: Diagnosis not present

## 2025-01-06 LAB — CBC
HCT: 59.9 % — ABNORMAL HIGH (ref 36.0–46.0)
Hemoglobin: 18 g/dL — ABNORMAL HIGH (ref 12.0–15.0)
MCH: 30.8 pg (ref 26.0–34.0)
MCHC: 30.1 g/dL (ref 30.0–36.0)
MCV: 102.4 fL — ABNORMAL HIGH (ref 80.0–100.0)
Platelets: 213 K/uL (ref 150–400)
RBC: 5.85 MIL/uL — ABNORMAL HIGH (ref 3.87–5.11)
RDW: 12.6 % (ref 11.5–15.5)
WBC: 12.7 K/uL — ABNORMAL HIGH (ref 4.0–10.5)
nRBC: 0 % (ref 0.0–0.2)

## 2025-01-06 LAB — BASIC METABOLIC PANEL WITH GFR
Anion gap: 5 (ref 5–15)
BUN: 20 mg/dL (ref 8–23)
CO2: 44 mmol/L — ABNORMAL HIGH (ref 22–32)
Calcium: 10.2 mg/dL (ref 8.9–10.3)
Chloride: 93 mmol/L — ABNORMAL LOW (ref 98–111)
Creatinine, Ser: 0.53 mg/dL (ref 0.44–1.00)
GFR, Estimated: >60 mL/min
Glucose, Bld: 92 mg/dL (ref 70–99)
Potassium: 5.1 mmol/L (ref 3.5–5.1)
Sodium: 142 mmol/L (ref 135–145)

## 2025-01-06 MED ORDER — ALBUTEROL SULFATE (2.5 MG/3ML) 0.083% IN NEBU
2.5000 mg | INHALATION_SOLUTION | Freq: Four times a day (QID) | RESPIRATORY_TRACT | Status: DC
  Administered 2025-01-06 – 2025-01-07 (×2): 2.5 mg via RESPIRATORY_TRACT
  Filled 2025-01-06 (×2): qty 3

## 2025-01-06 NOTE — Progress Notes (Signed)
 " Progress Note   Patient: Gloria Jones [id number] DOB: 1961-01-24 DOA: 01/02/2025     2 DOS: the patient was seen and examined on 01/06/2025   Brief hospital course: 64 y.o. female with medical history significant for COPD, asthma, essential hypertension, prediabetes, chronic respiratory failure on 2 L of oxygen, history of kidney stones, being admitted with COPD exacerbation with increased O2 requirement to 3 L.  She presented with several day history of cough productive of green phlegm, generalized bodyaches and O2 sat in the mid 80s on her 2 L.  Denies fever. In the ED, temp 99.4 with pulse 85 BP 131/80, requiring 3 L to maintain sats at 92%. Labs notable for WBC of 11.  Serum bicarb 36.  Respiratory viral panel negative.   EKG showed NSR at 85 Chest x-ray showing vascular congestion without significant interstitial edema.  No infiltrate or sizable effusion Patient treated with DuoNebs and Solu-Medrol  Admission requested.   1/15.  Patient still short of breath.  Still with cough greenish phlegm.  Complains of bodyaches. 1/16.  Patient with more bronchospasm today.  Switch prednisone  back to Solu-Medrol . 1/17.  Still feeling tight.  Continue to monitor with bronchospasm today.  Assessment and Plan: * COPD with acute exacerbation (HCC) Acute on chronic respiratory failure with hypoxia Patient presented with shortness of breath, wheezing reported O2 sat in the mid 80s on her baseline O2 on 2 L, initially requiring 3 L to maintain sats 92%. Continue nebulizers.  Continue Solu-Medrol  for bronchospasm.  Asked nursing staff to decrease down to her baseline 2 L.  Will get VBG tomorrow morning.  Erythrocytosis Could be secondary to smoking history and chronic hypoxia.  Hemoglobin 18.0.  Will check JAK2 mutation and erythropoietin .  Obesity (BMI 30-39.9) Class I with a BMI of 33.66  Fungal skin infection Continue Lac-Hydrin  lotion and antifungal cream.  Thrush Continue  Diflucan   Chronic pain syndrome Continue duloxetine  and tizanidine   Pre-diabetes Continue low carbohydrate diet  Hypertension Continue amlodipine  and spironolactone         Subjective: Patient still feeling a little bit tight.  Still with cough.  Still not feeling great.  Admitted with COPD exacerbation.  Physical Exam: Vitals:   01/05/25 1558 01/05/25 2030 01/06/25 0513 01/06/25 0813  BP: (!) 145/84 134/84 (!) 150/79 (!) 152/93  Pulse: 75 83 71 71  Resp: 18 18 18 17   Temp: 98.9 F (37.2 C) 98.3 F (36.8 C) 98.3 F (36.8 C) 98 F (36.7 C)  TempSrc: Oral   Oral  SpO2: 91% 91% 91% 96%  Weight:      Height:       Physical Exam HENT:     Head: Normocephalic.     Mouth/Throat:     Comments: Thrush on tongue Eyes:     General: Lids are normal.     Conjunctiva/sclera: Conjunctivae normal.  Cardiovascular:     Rate and Rhythm: Normal rate and regular rhythm.     Heart sounds: Normal heart sounds, S1 normal and S2 normal.  Pulmonary:     Breath sounds: Examination of the right-lower field reveals decreased breath sounds and wheezing. Examination of the left-lower field reveals decreased breath sounds and wheezing. Decreased breath sounds and wheezing present. No rhonchi or rales.  Abdominal:     Palpations: Abdomen is soft.     Tenderness: There is no abdominal tenderness.  Musculoskeletal:     Right lower leg: No swelling.     Left lower leg: No swelling.  Skin:  General: Skin is warm.     Comments: Slight redness and scaling on feet.  Neurological:     Mental Status: She is alert and oriented to person, place, and time.     Data Reviewed: CO2 on chemistry 44, creatinine 0.53, white blood cell count 12.7, hemoglobin 18.0, hematocrit 59.9, MCV 102.4, platelet count 213  Disposition: Status is: Inpatient Remains inpatient appropriate because: Continue IV Solu-Medrol  with bronchospasm.  Nebulizer standing dose instead of as needed.  Planned Discharge  Destination: Home    Time spent: 28 minutes  Author: Charlie Patterson, MD 01/06/2025 3:28 PM  For on call review [email or web address]  "

## 2025-01-06 NOTE — Progress Notes (Signed)
 Respiratory protocol assessment completed.  Changes made per protocol

## 2025-01-06 NOTE — Plan of Care (Signed)
  Problem: Elimination: Goal: Will not experience complications related to bowel motility Outcome: Progressing   Problem: Elimination: Goal: Will not experience complications related to urinary retention Outcome: Progressing   Problem: Safety: Goal: Ability to remain free from injury will improve Outcome: Progressing

## 2025-01-06 NOTE — Assessment & Plan Note (Signed)
 Could be secondary to smoking history and chronic hypoxia.  Hemoglobin 19.1.  Will check JAK2 mutation.  Erythropoietin  level normal range.  Therapeutic phlebotomy done on 1/19.  Continue aspirin .  Refer to hematology as outpatient.

## 2025-01-07 ENCOUNTER — Encounter: Payer: Self-pay | Admitting: Internal Medicine

## 2025-01-07 DIAGNOSIS — J441 Chronic obstructive pulmonary disease with (acute) exacerbation: Secondary | ICD-10-CM | POA: Diagnosis not present

## 2025-01-07 DIAGNOSIS — J9602 Acute respiratory failure with hypercapnia: Secondary | ICD-10-CM

## 2025-01-07 DIAGNOSIS — E669 Obesity, unspecified: Secondary | ICD-10-CM | POA: Diagnosis not present

## 2025-01-07 DIAGNOSIS — D751 Secondary polycythemia: Secondary | ICD-10-CM | POA: Diagnosis not present

## 2025-01-07 DIAGNOSIS — J9601 Acute respiratory failure with hypoxia: Secondary | ICD-10-CM

## 2025-01-07 LAB — BLOOD GAS, VENOUS
Acid-Base Excess: 21 mmol/L — ABNORMAL HIGH (ref 0.0–2.0)
Bicarbonate: 51.5 mmol/L — ABNORMAL HIGH (ref 20.0–28.0)
O2 Saturation: 92.7 %
Patient temperature: 37
pCO2, Ven: 89 mmHg (ref 44–60)
pH, Ven: 7.37 (ref 7.25–7.43)
pO2, Ven: 61 mmHg — ABNORMAL HIGH (ref 32–45)

## 2025-01-07 LAB — BASIC METABOLIC PANEL WITH GFR
Anion gap: 8 (ref 5–15)
BUN: 23 mg/dL (ref 8–23)
CO2: 42 mmol/L — ABNORMAL HIGH (ref 22–32)
Calcium: 10.2 mg/dL (ref 8.9–10.3)
Chloride: 91 mmol/L — ABNORMAL LOW (ref 98–111)
Creatinine, Ser: 0.52 mg/dL (ref 0.44–1.00)
GFR, Estimated: >60 mL/min
Glucose, Bld: 81 mg/dL (ref 70–99)
Potassium: 4.8 mmol/L (ref 3.5–5.1)
Sodium: 140 mmol/L (ref 135–145)

## 2025-01-07 LAB — BLOOD GAS, ARTERIAL
Acid-Base Excess: 18.6 mmol/L — ABNORMAL HIGH (ref 0.0–2.0)
Bicarbonate: 49.1 mmol/L — ABNORMAL HIGH (ref 20.0–28.0)
O2 Saturation: 94.7 %
Patient temperature: 37
pCO2 arterial: 74 mmHg (ref 32–48)
pH, Arterial: 7.43 (ref 7.35–7.45)
pO2, Arterial: 69 mmHg — ABNORMAL LOW (ref 83–108)

## 2025-01-07 LAB — HEMOGLOBIN: Hemoglobin: 19 g/dL — ABNORMAL HIGH (ref 12.0–15.0)

## 2025-01-07 MED ORDER — METHYLPREDNISOLONE SODIUM SUCC 125 MG IJ SOLR
80.0000 mg | Freq: Every day | INTRAMUSCULAR | Status: DC
  Administered 2025-01-07 – 2025-01-08 (×2): 80 mg via INTRAVENOUS
  Filled 2025-01-07 (×3): qty 2

## 2025-01-07 MED ORDER — AMLODIPINE BESYLATE 5 MG PO TABS
5.0000 mg | ORAL_TABLET | Freq: Once | ORAL | Status: AC
  Administered 2025-01-07: 5 mg via ORAL
  Filled 2025-01-07: qty 1

## 2025-01-07 MED ORDER — BUDESON-GLYCOPYRROL-FORMOTEROL 160-9-4.8 MCG/ACT IN AERO
2.0000 | INHALATION_SPRAY | Freq: Two times a day (BID) | RESPIRATORY_TRACT | Status: DC
  Filled 2025-01-07: qty 5.9

## 2025-01-07 MED ORDER — AMLODIPINE BESYLATE 10 MG PO TABS
10.0000 mg | ORAL_TABLET | Freq: Every day | ORAL | Status: DC
  Administered 2025-01-08: 10 mg via ORAL
  Filled 2025-01-07: qty 1

## 2025-01-07 MED ORDER — ALBUTEROL SULFATE (2.5 MG/3ML) 0.083% IN NEBU
2.5000 mg | INHALATION_SOLUTION | Freq: Two times a day (BID) | RESPIRATORY_TRACT | Status: DC
  Administered 2025-01-07 – 2025-01-08 (×2): 2.5 mg via RESPIRATORY_TRACT
  Filled 2025-01-07 (×2): qty 3

## 2025-01-07 MED ORDER — ALBUTEROL SULFATE (2.5 MG/3ML) 0.083% IN NEBU: 2.5000 mg | INHALATION_SOLUTION | RESPIRATORY_TRACT | Status: DC | PRN

## 2025-01-07 MED ORDER — FLUTICASONE-UMECLIDIN-VILANT 100-62.5-25 MCG/ACT IN AEPB
1.0000 | INHALATION_SPRAY | RESPIRATORY_TRACT | Status: DC
  Administered 2025-01-07 – 2025-01-08 (×2): 1 via RESPIRATORY_TRACT
  Filled 2025-01-07: qty 1

## 2025-01-07 NOTE — Progress Notes (Signed)
 " Progress Note   Patient: Gloria Jones FMW:[id number] DOB: [birth year] DOA: 01/02/2025     3 DOS: the patient was seen and examined on 01/07/2025   Brief hospital course: 64 y.o. female with medical history significant for COPD, asthma, essential hypertension, prediabetes, chronic respiratory failure on 2 L of oxygen, history of kidney stones, being admitted with COPD exacerbation with increased O2 requirement to 3 L.  She presented with several day history of cough productive of green phlegm, generalized bodyaches and O2 sat in the mid 80s on her 2 L.  Denies fever. In the ED, temp 99.4 with pulse 85 BP 131/80, requiring 3 L to maintain sats at 92%. Labs notable for WBC of 11.  Serum bicarb 36.  Respiratory viral panel negative.   EKG showed NSR at 85 Chest x-ray showing vascular congestion without significant interstitial edema.  No infiltrate or sizable effusion Patient treated with DuoNebs and Solu-Medrol  Admission requested.   1/15.  Patient still short of breath.  Still with cough greenish phlegm.  Complains of bodyaches. 1/16.  Patient with more bronchospasm today.  Switch prednisone  back to Solu-Medrol . 1/17.  Still feeling tight.  Continue to monitor with bronchospasm today. 1/18.  Still feeling tight. 1/19.  Venous blood gas showing a pCO2 of 89.  Will give a trial of BiPAP at night and see if we can set up noninvasive ventilation.  Hemoglobin up at 19.  Set up a therapeutic phlebotomy.  JAK2 and erythropoietin  level pending.  Increase Solu-Medrol  to 80 mg daily.  Assessment and Plan: * COPD with acute exacerbation (HCC) Patient presented with shortness of breath, wheezing reported O2 sat in the mid 80s on her baseline O2 on 2 L, initially requiring 3 L to maintain sats 92%. Continue nebulizers.  Increase Solu-Medrol  up to 80 mg daily.  Continue Trelegy inhaler.  Will give a trial of BiPAP at night with pCO2 being elevated.   Acute respiratory failure with hypoxia and hypercapnia  (HCC) Venous blood gas showing a pCO2 of 89.  Will try BiPAP at night.  TOC to try to get noninvasive ventilation approved at night.  ABG ordered.  Try to get oxygen supplementation down to 2 to 3 L.  Erythrocytosis Could be secondary to smoking history and chronic hypoxia.  Hemoglobin 19.0.  Will check JAK2 mutation and erythropoietin .  Therapeutic phlebotomy done today.  Continue aspirin .  Obesity (BMI 30-39.9) Class I with a BMI of 33.66  Fungal skin infection Continue Lac-Hydrin  lotion and antifungal cream.  Thrush Continue Diflucan   Chronic pain syndrome Continue duloxetine  and tizanidine   Pre-diabetes Continue low carbohydrate diet  Hypertension Continue amlodipine  at a higher dose with an extra dose today and spironolactone .        Subjective: Patient still feeling tight.  Can go back on her Trelegy inhaler.  Increase Solu-Medrol  to 80 mg daily.  pCO2 on venous blood gas at that 89.  Will try BiPAP at night.  Physical Exam: Vitals:   01/07/25 0802 01/07/25 1059 01/07/25 1118 01/07/25 1131  BP: (!) 128/90 (!) 126/92  (!) 119/101  Pulse: 82 76  73  Resp: 15 (!) 22  (!) 22  Temp: 98.7 F (37.1 C) 98.6 F (37 C)  99.1 F (37.3 C)  TempSrc: Oral Oral  Oral  SpO2: 92% 90% 90% 91%  Weight:      Height:       Physical Exam HENT:     Head: Normocephalic.     Mouth/Throat:  Comments: Thrush on tongue Eyes:     General: Lids are normal.     Conjunctiva/sclera: Conjunctivae normal.  Cardiovascular:     Rate and Rhythm: Normal rate and regular rhythm.     Heart sounds: Normal heart sounds, S1 normal and S2 normal.  Pulmonary:     Breath sounds: Examination of the right-middle field reveals decreased breath sounds. Examination of the left-middle field reveals decreased breath sounds. Examination of the right-lower field reveals decreased breath sounds and wheezing. Examination of the left-lower field reveals decreased breath sounds and wheezing. Decreased breath  sounds and wheezing present. No rhonchi or rales.  Abdominal:     Palpations: Abdomen is soft.     Tenderness: There is no abdominal tenderness.  Musculoskeletal:     Right lower leg: No swelling.     Left lower leg: No swelling.  Skin:    General: Skin is warm.     Comments: Slight redness and scaling on feet.  Neurological:     Mental Status: She is alert and oriented to person, place, and time.     Data Reviewed: Venous blood gas shows a pCO2 of 89, PaO2 of 61.  Chemistry shows a CO2 of 42, creatinine 0.52, hemoglobin 19  Disposition: Status is: Inpatient Remains inpatient appropriate because: Will try BiPAP at night with elevated pCO2.  Still feeling tight will increase Solu-Medrol  to 80 mg daily.  Planned Discharge Destination: Home    Time spent: 28 minutes  Author: Charlie Patterson, MD 01/07/2025 1:35 PM  For on call review [email or web address]  "

## 2025-01-07 NOTE — TOC Progression Note (Signed)
 Transition of Care [facility]) - Progression Note    Patient Details  Name: Gloria Jones MRN: [id number] Date of Birth: 1960/12/23  [facility]) CM/SW Contact  Daved JONETTA Hamilton, RN Phone Number: 01/07/2025, 12:11 PM  Clinical Narrative:     Spoke to patient, introduced self and explained role. Discussed with patient Attending requesting workup for NIV and requested to know who patient uses for home O2. Patient advised she uses [employer] and provided [phone]. [employer] provides drop ship O2 to patient's however not NIV's.   Spoke with Rosina at [facility], he will begin workup.                      Expected Discharge Plan and Services                                               Social Drivers of Health (SDOH) Interventions SDOH Screenings   Food Insecurity: No Food Insecurity (01/03/2025)  Recent Concern: Food Insecurity - Food Insecurity Present (10/30/2024)   Received from [facility]  Housing: Low Risk (01/03/2025)  Transportation Needs: No Transportation Needs (01/03/2025)  Utilities: Not At Risk (01/03/2025)  Financial Resource Strain: High Risk (10/30/2024)   Received from [facility]  Physical Activity: Inactive (10/30/2024)   Received from [facility]  Social Connections: Moderately Isolated (01/03/2025)  Stress: Stress Concern Present (10/30/2024)   Received from [facility]  Tobacco Use: High Risk (01/07/2025)  Health Literacy: Adequate Health Literacy (10/30/2024)   Received from [facility]    Readmission Risk Interventions     No data to display

## 2025-01-07 NOTE — Assessment & Plan Note (Addendum)
 Venous blood gas showing a pCO2 of 89.  Will try BiPAP at night.  TOC to try to get noninvasive ventilation approved at night.  ABG ordered.  Try to get oxygen supplementation down to 2 to 3 L.

## 2025-01-07 NOTE — Progress Notes (Signed)
 Dr Josette notified of CO2 74.

## 2025-01-07 NOTE — Progress Notes (Signed)
 Gloria Jones requires volume targeted ventilation via NIV/HMV for nocturnal and daytime use for CRF secondary to COPD. Alarms and internal battery are required to ensure continuous support and immediate recognition of ventilatory insufficiency. A RAD with backup rate is insufficient due to the severity of illness. Volume ventilation is needed to reduce work of breathing and fatigue and to prevent life threatening exacerbations. Patient is able to protect airway and clear secretions.   OSA is not a contributing factor for the hypercapnia.   Dr. Charlie Patterson

## 2025-01-07 NOTE — Progress Notes (Signed)
 Dr Josette notified of critical lab value of 10.

## 2025-01-08 DIAGNOSIS — E875 Hyperkalemia: Secondary | ICD-10-CM

## 2025-01-08 LAB — BASIC METABOLIC PANEL WITH GFR
Anion gap: 12 (ref 5–15)
BUN: 35 mg/dL — ABNORMAL HIGH (ref 8–23)
CO2: 31 mmol/L (ref 22–32)
Calcium: 9.6 mg/dL (ref 8.9–10.3)
Chloride: 92 mmol/L — ABNORMAL LOW (ref 98–111)
Creatinine, Ser: 0.53 mg/dL (ref 0.44–1.00)
GFR, Estimated: >60 mL/min
Glucose, Bld: 152 mg/dL — ABNORMAL HIGH (ref 70–99)
Potassium: 5.7 mmol/L — ABNORMAL HIGH (ref 3.5–5.1)
Sodium: 135 mmol/L (ref 135–145)

## 2025-01-08 LAB — POTASSIUM: Potassium: 5.3 mmol/L — ABNORMAL HIGH (ref 3.5–5.1)

## 2025-01-08 LAB — ERYTHROPOIETIN: Erythropoietin: 4.7 m[IU]/mL (ref 2.6–18.5)

## 2025-01-08 LAB — HEMOGLOBIN: Hemoglobin: 19.1 g/dL — ABNORMAL HIGH (ref 12.0–15.0)

## 2025-01-08 MED ORDER — PANTOPRAZOLE SODIUM 40 MG PO TBEC: 40.0000 mg | DELAYED_RELEASE_TABLET | Freq: Every day | ORAL | 0 refills | Status: AC

## 2025-01-08 MED ORDER — AMMONIUM LACTATE 12 % EX LOTN: TOPICAL_LOTION | CUTANEOUS | 0 refills | Status: AC

## 2025-01-08 MED ORDER — POLYETHYLENE GLYCOL 3350 17 G PO PACK: 17.0000 g | PACK | Freq: Every day | ORAL | 0 refills | Status: AC | PRN

## 2025-01-08 MED ORDER — SODIUM ZIRCONIUM CYCLOSILICATE 10 G PO PACK
10.0000 g | PACK | Freq: Once | ORAL | Status: AC
  Administered 2025-01-08: 10 g via ORAL
  Filled 2025-01-08: qty 1

## 2025-01-08 MED ORDER — NICOTINE 14 MG/24HR TD PT24: MEDICATED_PATCH | TRANSDERMAL | 0 refills | Status: AC

## 2025-01-08 MED ORDER — PREDNISONE 10 MG PO TABS: ORAL_TABLET | ORAL | 0 refills | Status: AC

## 2025-01-08 MED ORDER — AMLODIPINE BESYLATE 10 MG PO TABS: 10.0000 mg | ORAL_TABLET | Freq: Every day | ORAL | 0 refills | Status: AC

## 2025-01-08 MED ORDER — ENOXAPARIN SODIUM 40 MG/0.4ML IJ SOSY: 40.0000 mg | PREFILLED_SYRINGE | INTRAMUSCULAR | Status: DC

## 2025-01-08 MED ORDER — GUAIFENESIN ER 600 MG PO TB12: 600.0000 mg | ORAL_TABLET | Freq: Two times a day (BID) | ORAL | 0 refills | Status: AC

## 2025-01-08 MED ORDER — KETOCONAZOLE 2 % EX CREA: TOPICAL_CREAM | CUTANEOUS | 0 refills | Status: AC

## 2025-01-08 MED ORDER — FLUCONAZOLE 100 MG PO TABS: 100.0000 mg | ORAL_TABLET | Freq: Every day | ORAL | 0 refills | Status: AC

## 2025-01-08 NOTE — Discharge Instructions (Signed)
 Recommend checking a BMP with follow-up appointment.

## 2025-01-08 NOTE — Assessment & Plan Note (Signed)
 Stop Aldactone .  Lokelma  given twice on 1/20.  Recommend checking a BMP with follow-up appointment.

## 2025-01-08 NOTE — TOC Transition Note (Signed)
 Transition of Care [facility]) - Discharge Note   Patient Details  Name: Gloria Jones MRN: [id number] Date of Birth: 09-22-1961  Transition of Care [facility]) CM/SW Contact:  Nathanael CHRISTELLA Ring, RN Phone Number: 01/08/2025, 3:08 PM   Clinical Narrative:    Patient is medically cleared for discharge.  Contacted Ashly with [facility], his respiratory therapist is good to go with delivering and providing education to the patient this afternoon when she gets home.     Final next level of care: Home/Self Care Barriers to Discharge: Barriers Resolved   Patient Goals and CMS Choice            Discharge Placement                       Discharge Plan and Services Additional resources added to the After Visit Summary for                  DME Arranged: NIV DME Agency: Other - Comment Britt) Date DME Agency Contacted: 01/08/25 Time DME Agency Contacted: [phone] Representative spoke with at DME Agency: Ashly HH Arranged: NA          Social Drivers of Health (SDOH) Interventions SDOH Screenings   Food Insecurity: No Food Insecurity (01/03/2025)  Recent Concern: Food Insecurity - Food Insecurity Present (10/30/2024)   Received from [facility]  Housing: Low Risk (01/03/2025)  Transportation Needs: No Transportation Needs (01/03/2025)  Utilities: Not At Risk (01/03/2025)  Financial Resource Strain: High Risk (10/30/2024)   Received from [facility]  Physical Activity: Inactive (10/30/2024)   Received from [facility]  Social Connections: Moderately Isolated (01/03/2025)  Stress: Stress Concern Present (10/30/2024)   Received from [facility]  Tobacco Use: High Risk (01/07/2025)  Health Literacy: Adequate Health Literacy (10/30/2024)   Received from [facility]     Readmission Risk Interventions     No data to display

## 2025-01-08 NOTE — Plan of Care (Signed)
   Problem: Health Behavior/Discharge Planning: Goal: Ability to manage health-related needs will improve Outcome: Progressing   Problem: Clinical Measurements: Goal: Ability to maintain clinical measurements within normal limits will improve Outcome: Progressing Goal: Will remain free from infection Outcome: Progressing   Problem: Activity: Goal: Risk for activity intolerance will decrease Outcome: Progressing

## 2025-01-08 NOTE — Discharge Summary (Signed)
 " Physician Discharge Summary   Patient: Gloria Jones MRN: [id number] DOB: October 21, 1961  Admit date:     01/02/2025  Discharge date: 01/08/25  Discharge Physician: Charlie Patterson   PCP: [facility]   Recommendations at discharge:   Follow-up PCP 5 days Follow-up pulmonology 1 week Referred to hematology  Discharge Diagnoses: Principal Problem:   COPD with acute exacerbation (HCC) Active Problems:   Acute respiratory failure with hypoxia and hypercapnia (HCC)   Hyperkalemia   Hypertension   Pre-diabetes   Chronic pain syndrome   Thrush   Fungal skin infection   Obesity (BMI 30-39.9)   Erythrocytosis  Resolved Problems:   * No resolved hospital problems. *  Hospital Course: 64 y.o. female with medical history significant for COPD, asthma, essential hypertension, prediabetes, chronic respiratory failure on 2 L of oxygen, history of kidney stones, being admitted with COPD exacerbation with increased O2 requirement to 3 L.  She presented with several day history of cough productive of green phlegm, generalized bodyaches and O2 sat in the mid 80s on her 2 L.  Denies fever. In the ED, temp 99.4 with pulse 85 BP 131/80, requiring 3 L to maintain sats at 92%. Labs notable for WBC of 11.  Serum bicarb 36.  Respiratory viral panel negative.   EKG showed NSR at 85 Chest x-ray showing vascular congestion without significant interstitial edema.  No infiltrate or sizable effusion Patient treated with DuoNebs and Solu-Medrol  Admission requested.   1/15.  Patient still short of breath.  Still with cough greenish phlegm.  Complains of bodyaches. 1/16.  Patient with more bronchospasm today.  Switch prednisone  back to Solu-Medrol . 1/17.  Still feeling tight.  Continue to monitor with bronchospasm today. 1/18.  Still feeling tight. 1/19.  Venous blood gas showing a pCO2 of 89.  Will give a trial of BiPAP at night and see if we can set up noninvasive ventilation.  Hemoglobin up  at 19.  Set up a therapeutic phlebotomy.  JAK2 and erythropoietin  level pending.  Increase Solu-Medrol  to 80 mg daily. 1/20.  Set up for noninvasive ventilation at night with trilogy machine.  Patient feeling better and wants to go home.  Potassium 5.7 this morning.  Aldactone  discontinued and a dose of Lokelma  given.  Repeat potassium 5.3 will give another dose of Lokelma  upon going home.  Needs a repeat check of potassium with follow-up appointment.  Assessment and Plan: * COPD with acute exacerbation (HCC) Patient presented with shortness of breath, wheezing reported O2 sat in the mid 80s on her baseline O2 on 2 L, initially requiring 3 L to maintain sats 92%. Continue nebulizers.  Increase Solu-Medrol  up to 80 mg daily.  Continue Trelegy inhaler.  Will give a trial of BiPAP at night with pCO2 being elevated.  Patient set up with noninvasive ventilation at home to avoid hospitalizations.  Since patient was on Solu-Medrol  the entire time here will have to taper prednisone  upon discharge.  Acute respiratory failure with hypoxia and hypercapnia (HCC) Venous blood gas showing a pCO2 of 89.  ABG showed a pCO2 of 74.  Patient tried BiPAP last night and was able to tolerate it for 5 hours.  CO2 on chemistry improved down to 31.  TOC set up noninvasive ventilation trilogy machine at night for home use.  Continue oxygen 2 L.  Hyperkalemia Stop Aldactone .  Lokelma  given twice on 1/20.  Recommend checking a BMP with follow-up appointment.  Erythrocytosis Could be secondary to smoking history and chronic  hypoxia.  Hemoglobin 19.1.  Will check JAK2 mutation.  Erythropoietin  level normal range.  Therapeutic phlebotomy done on 1/19.  Continue aspirin .  Refer to hematology as outpatient.  Obesity (BMI 30-39.9) Class I with a BMI of 33.66  Fungal skin infection Continue Lac-Hydrin  lotion and antifungal cream.  Thrush Continue Diflucan   Chronic pain syndrome Continue duloxetine  and  tizanidine   Pre-diabetes Continue low carbohydrate diet  Hypertension Continue amlodipine  at a higher dose.  Discontinue Aldactone  with hyperkalemia         Consultants: None Procedures performed: Therapeutic phlebotomy Disposition: Home Diet recommendation:  Cardiac and Carb modified diet DISCHARGE MEDICATION: Allergies as of 01/08/2025       Reactions   Simvastatin    Other reaction(s): Muscle Pain   Statins    Other reaction(s): Other (See Comments) arthralgia   Fish Allergy    Lisinopril    Other reaction(s): Cough   Losartan    Other reaction(s): Headache   Pravastatin Other (See Comments)        Medication List     STOP taking these medications    furosemide  20 MG tablet Commonly known as: LASIX    ipratropium-albuterol  0.5-2.5 (3) MG/3ML Soln Commonly known as: DUONEB   methylPREDNISolone  4 MG Tbpk tablet Commonly known as: MEDROL  DOSEPAK   spironolactone  25 MG tablet Commonly known as: ALDACTONE        TAKE these medications    albuterol  108 (90 Base) MCG/ACT inhaler Commonly known as: VENTOLIN  HFA Inhale 1-2 puffs into the lungs every 4 (four) hours as needed for wheezing or shortness of breath.   albuterol  (2.5 MG/3ML) 0.083% nebulizer solution Commonly known as: PROVENTIL  Take 3 mLs (2.5 mg total) by nebulization every 4 (four) hours as needed for wheezing or shortness of breath.   amLODipine  10 MG tablet Commonly known as: NORVASC  Take 1 tablet (10 mg total) by mouth daily. Start taking on: January 09, 2025 What changed:  medication strength how much to take   ammonium lactate  12 % lotion Commonly known as: LAC-HYDRIN  Apply twice a day to feet.   aspirin  EC 81 MG tablet Take 81 mg by mouth daily. Swallow whole.   DULoxetine  60 MG capsule Commonly known as: CYMBALTA  Take 60 mg by mouth every morning.   ezetimibe  10 MG tablet Commonly known as: ZETIA  Take 10 mg by mouth daily.   fluconazole  100 MG tablet Commonly known  as: DIFLUCAN  Take 1 tablet (100 mg total) by mouth daily. Start taking on: January 09, 2025   fluticasone  50 MCG/ACT nasal spray Commonly known as: FLONASE  Place 2 sprays into both nostrils daily.   guaiFENesin  600 MG 12 hr tablet Commonly known as: MUCINEX  Take 1 tablet (600 mg total) by mouth 2 (two) times daily for 10 days.   ketoconazole  2 % cream Commonly known as: NIZORAL  Apply twice daily to feet.   nicotine  14 mg/24hr patch Commonly known as: NICODERM CQ  - dosed in mg/24 hours One 14 mg patch to chest wall daily (okay to substitute generic) Start taking on: January 09, 2025   OXYGEN Inhale 2 L/min into the lungs as needed (DYSPNEA).   pantoprazole  40 MG tablet Commonly known as: PROTONIX  Take 1 tablet (40 mg total) by mouth daily.   polyethylene glycol 17 g packet Commonly known as: MIRALAX  / GLYCOLAX  Take 17 g by mouth daily as needed for moderate constipation.   predniSONE  10 MG tablet Commonly known as: DELTASONE  3 tabs p.o. day 1, 2;  2 tabs p.o. day 3,  4;  1 tab p.o. day 5, 6;   0.5 tablet p.o. day 7 and 8 Start taking on: January 09, 2025   tiZANidine  4 MG tablet Commonly known as: ZANAFLEX  Take 1 tablet (4 mg total) by mouth every 8 (eight) hours as needed for muscle spasms.   Trelegy Ellipta  100-62.5-25 MCG/ACT Aepb Generic drug: Fluticasone -Umeclidin-Vilant Inhale 1 puff into the lungs daily. Take as directed - one dose every 24 hours for COPD.        Follow-up Information     [place] Follow up.   Why: hospital follow up Contact information: [address] [place] [address] [phone]         Theotis Lavelle BRAVO, MD Follow up in 1 week(s).   Specialty: Specialist Contact information: [address] [place] [address] [phone]         Rennie Cindy SAUNDERS, MD Follow up in 2 week(s).   Specialties: Internal Medicine, Oncology Why: erythrocytosis Contact information: [address] [place] [address] [place] [address] [phone]                Discharge Exam: Fredricka Weights   01/02/25 2000  Weight: 86.2 kg   Physical Exam HENT:     Head: Normocephalic.     Mouth/Throat:     Comments: Thrush on tongue Eyes:     General: Lids are normal.     Conjunctiva/sclera: Conjunctivae normal.  Cardiovascular:     Rate and Rhythm: Normal rate and regular rhythm.     Heart sounds: Normal heart sounds, S1 normal and S2 normal.  Pulmonary:     Breath sounds: Examination of the right-lower field reveals decreased breath sounds and wheezing. Examination of the left-lower field reveals decreased breath sounds and wheezing. Decreased breath sounds and wheezing present. No rhonchi or rales.  Abdominal:     Palpations: Abdomen is soft.     Tenderness: There is no abdominal tenderness.  Musculoskeletal:     Right lower leg: No swelling.     Left lower leg: No swelling.  Skin:    General: Skin is warm.     Comments: Slight redness and scaling on feet.  Neurological:     Mental Status: She is alert and oriented to person, place, and time.      Condition at discharge: stable  The results of significant diagnostics from this hospitalization (including imaging, microbiology, ancillary and laboratory) are listed below for reference.   Imaging Studies: DG Chest 2 View Result Date: 01/02/2025 EXAM: 2 VIEW(S) XRAY OF THE CHEST 01/02/2025 08:21:39 PM COMPARISON: Comparison study 07/13/2024, 08/25/2024. CLINICAL HISTORY: Shortness of breath. FINDINGS: LUNGS AND PLEURA: Vascular congestion is noted without significant interstitial edema. No focal confluent infiltrate or sizable effusion is seen. No pneumothorax. HEART AND MEDIASTINUM: Cardiac shadow is enlarged. BONES AND SOFT TISSUES: No bony abnormality is noted. IMPRESSION: 1. Enlarged cardiac shadow and vascular congestion without significant interstitial edema. 2. No focal confluent infiltrate or sizable effusion. Electronically signed  by: Oneil Devonshire MD 01/02/2025 08:26 PM EST RP Workstation: [id number]   Sleep Study Documents Result Date: 12/27/2024 Ordered by an unspecified provider.  DG Abd 1 View Result Date: 12/25/2024 EXAM: 1 VIEW XRAY OF THE ABDOMEN 12/25/2024 07:23:20 PM COMPARISON: CT 07/04/2020. Plain Films 07/20/2022. CLINICAL HISTORY: History of obstructing nephrolithiasis, right and midline back pain. Rule out recurrent nephrolithiasis. FINDINGS: BOWEL: Nonobstructive bowel gas pattern. Stones could be obscured by overlying stool. SOFT TISSUES: Previously seen right lower pole nephrolithiasis not  definitively seen. No visible suspicious calcification. BONES: No acute fracture. IMPRESSION: 1. Previously seen right lower pole nephrolithiasis is not definitively seen, possibly obscured by overlying stool, with no visible suspicious calcification. 2. No acute findings. Electronically signed by: Franky Crease MD 12/25/2024 07:39 PM EST RP Workstation: [id number]   DG Lumbar Spine Complete Result Date: 12/25/2024 EXAM: 4 VIEW(S) XRAY OF THE LUMBAR SPINE 12/25/2024 07:23:08 PM COMPARISON: 09/27/2023 CLINICAL HISTORY: midline tenderness L4-S1 h/o prolonged steriod use, lumbar spondylesis, r/o acute changes. FINDINGS: LUMBAR SPINE: BONES: Vertebral body heights are maintained. Alignment is normal. No fracture or subluxation. DISCS AND DEGENERATIVE CHANGES: Advanced diffuse degenerative facet disease, most pronounced from L3-L4 through L5-S1. Disc space narrowing at L4-L5 and L5-S1 with early anterior spurring and vacuum disc at L5-S1. SOFT TISSUES: Aortic atherosclerosis. No aneurysm. IMPRESSION: 1. No acute osseous abnormality. 2. Multilevel degenerative disc and facet disease as above. Electronically signed by: Franky Crease MD 12/25/2024 07:38 PM EST RP Workstation: [id number]    Microbiology: Results for orders placed or performed during the hospital encounter of 01/02/25  Resp panel by RT-PCR (RSV, Flu A&B, Covid) Anterior Nasal  Swab     Status: None   Collection Time: 01/02/25  8:03 PM   Specimen: Anterior Nasal Swab  Result Value Ref Range Status   SARS Coronavirus 2 by RT PCR NEGATIVE NEGATIVE Final    Comment: (NOTE) SARS-CoV-2 target nucleic acids are NOT DETECTED.  The SARS-CoV-2 RNA is generally detectable in upper respiratory specimens during the acute phase of infection. The lowest concentration of SARS-CoV-2 viral copies this assay can detect is 138 copies/mL. A negative result does not preclude SARS-Cov-2 infection and should not be used as the sole basis for treatment or other patient management decisions. A negative result may occur with  improper specimen collection/handling, submission of specimen other than nasopharyngeal swab, presence of viral mutation(s) within the areas targeted by this assay, and inadequate number of viral copies(<138 copies/mL). A negative result must be combined with clinical observations, patient history, and epidemiological information. The expected result is Negative.  Fact Sheet for Patients:  bloggercourse.com  Fact Sheet for Healthcare Providers:  seriousbroker.it  This test is no t yet approved or cleared by the [place]  FDA and  has been authorized for detection and/or diagnosis of SARS-CoV-2 by FDA under an Emergency Use Authorization (EUA). This EUA will remain  in effect (meaning this test can be used) for the duration of the COVID-19 declaration under Section 564(b)(1) of the Act, 21 U.S.C.section 360bbb-3(b)(1), unless the authorization is terminated  or revoked sooner.       Influenza A by PCR NEGATIVE NEGATIVE Final   Influenza B by PCR NEGATIVE NEGATIVE Final    Comment: (NOTE) The Xpert Xpress SARS-CoV-2/FLU/RSV plus assay is intended as an aid in the diagnosis of influenza from Nasopharyngeal swab specimens and should not be used as a sole basis for treatment. Nasal washings and aspirates  are unacceptable for Xpert Xpress SARS-CoV-2/FLU/RSV testing.  Fact Sheet for Patients: bloggercourse.com  Fact Sheet for Healthcare Providers: seriousbroker.it  This test is not yet approved or cleared by the [place]  FDA and has been authorized for detection and/or diagnosis of SARS-CoV-2 by FDA under an Emergency Use Authorization (EUA). This EUA will remain in effect (meaning this test can be used) for the duration of the COVID-19 declaration under Section 564(b)(1) of the Act, 21 U.S.C. section 360bbb-3(b)(1), unless the authorization is terminated or revoked.     Resp Syncytial Virus by PCR  NEGATIVE NEGATIVE Final    Comment: (NOTE) Fact Sheet for Patients: bloggercourse.com  Fact Sheet for Healthcare Providers: seriousbroker.it  This test is not yet approved or cleared by the [place]  FDA and has been authorized for detection and/or diagnosis of SARS-CoV-2 by FDA under an Emergency Use Authorization (EUA). This EUA will remain in effect (meaning this test can be used) for the duration of the COVID-19 declaration under Section 564(b)(1) of the Act, 21 U.S.C. section 360bbb-3(b)(1), unless the authorization is terminated or revoked.  Performed at  [facility], [address] [place], [place] [address]     Labs: CBC: Recent Labs  Lab 01/02/25 2003 01/05/25 9385 01/06/25 0540 01/07/25 0716 01/08/25 0440  WBC 11.0* 13.6* 12.7*  --   --   NEUTROABS 7.3  --   --   --   --   HGB 16.8* 16.8* 18.0* 19.0* 19.1*  HCT 55.2* 55.4* 59.9*  --   --   MCV 102.6* 102.2* 102.4*  --   --   PLT 212 214 213  --   --    Basic Metabolic Panel: Recent Labs  Lab 01/02/25 2003 01/06/25 0540 01/07/25 0716 01/08/25 0440 01/08/25 1347  NA 139 142 140 135  --   K 4.6 5.1 4.8 5.7* 5.3*  CL 97* 93* 91* 92*  --   CO2 36* 44* 42* 31  --   GLUCOSE 131* 92 81 152*   --   BUN 14 20 23  35*  --   CREATININE 0.54 0.53 0.52 0.53  --   CALCIUM 8.9 10.2 10.2 9.6  --    Liver Function Tests: No results for input(s): AST, ALT, ALKPHOS, BILITOT, PROT, ALBUMIN in the last 168 hours. CBG: No results for input(s): GLUCAP in the last 168 hours.  Discharge time spent: greater than 30 minutes.  Signed: Charlie Patterson, MD [facility] 01/08/2025 "

## 2025-01-08 NOTE — TOC Progression Note (Signed)
 Transition of Care [facility]) - Progression Note    Patient Details  Name: Gloria Jones MRN: [id number] Date of Birth: [birth year]  [facility]) CM/SW Contact  Nathanael CHRISTELLA Ring, RN Phone Number: 01/08/2025, 9:30 AM  Clinical Narrative:    [facility] has received authorization for the NIV so they are good to go.  Messaged MD and he reports that the patient's potassium was elevated this morning and he will recheck at noon.  [facility] Respiratory Therapist will be able to meet the patient at home to do teaching when she is discharged.                       Expected Discharge Plan and Services                                               Social Drivers of Health (SDOH) Interventions SDOH Screenings   Food Insecurity: No Food Insecurity (01/03/2025)  Recent Concern: Food Insecurity - Food Insecurity Present (10/30/2024)   Received from [facility]  Housing: Low Risk (01/03/2025)  Transportation Needs: No Transportation Needs (01/03/2025)  Utilities: Not At Risk (01/03/2025)  Financial Resource Strain: High Risk (10/30/2024)   Received from [facility]  Physical Activity: Inactive (10/30/2024)   Received from [facility]  Social Connections: Moderately Isolated (01/03/2025)  Stress: Stress Concern Present (10/30/2024)   Received from [facility]  Tobacco Use: High Risk (01/07/2025)  Health Literacy: Adequate Health Literacy (10/30/2024)   Received from [facility]    Readmission Risk Interventions     No data to display

## 2025-01-15 LAB — JAK2 V617F RFX CALR/MPL/E12-15

## 2025-01-15 LAB — CALR +MPL + E12-E15  (REFLEX)
